# Patient Record
Sex: Female | Born: 1945 | Hispanic: No | Marital: Married | Smoking: Never smoker
Health system: Southern US, Community
[De-identification: ages and names within clinical notes are randomized; demographics above are authoritative.]

## PROBLEM LIST (undated history)

## (undated) DIAGNOSIS — J189 Pneumonia, unspecified organism: Secondary | ICD-10-CM

## (undated) DIAGNOSIS — C801 Malignant (primary) neoplasm, unspecified: Secondary | ICD-10-CM

## (undated) DIAGNOSIS — D649 Anemia, unspecified: Secondary | ICD-10-CM

## (undated) DIAGNOSIS — Z803 Family history of malignant neoplasm of breast: Secondary | ICD-10-CM

## (undated) DIAGNOSIS — Z8049 Family history of malignant neoplasm of other genital organs: Secondary | ICD-10-CM

## (undated) DIAGNOSIS — I10 Essential (primary) hypertension: Secondary | ICD-10-CM

## (undated) HISTORY — PX: COLONOSCOPY: SHX174

## (undated) HISTORY — DX: Family history of malignant neoplasm of breast: Z80.3

## (undated) HISTORY — DX: Essential (primary) hypertension: I10

## (undated) HISTORY — PX: CYST REMOVAL HAND: SHX6279

## (undated) HISTORY — DX: Family history of malignant neoplasm of other genital organs: Z80.49

## (undated) HISTORY — PX: BREAST SURGERY: SHX581

## (undated) HISTORY — PX: WISDOM TOOTH EXTRACTION: SHX21

## (undated) HISTORY — PX: OTHER SURGICAL HISTORY: SHX169

---

## 1995-09-30 HISTORY — PX: ABDOMINAL HYSTERECTOMY: SHX81

## 2012-06-25 DIAGNOSIS — Z23 Encounter for immunization: Secondary | ICD-10-CM | POA: Diagnosis not present

## 2012-06-25 DIAGNOSIS — Z7189 Other specified counseling: Secondary | ICD-10-CM | POA: Diagnosis not present

## 2012-07-09 ENCOUNTER — Other Ambulatory Visit: Payer: Self-pay | Admitting: Family Medicine

## 2012-07-09 DIAGNOSIS — Z1231 Encounter for screening mammogram for malignant neoplasm of breast: Secondary | ICD-10-CM

## 2012-07-09 DIAGNOSIS — Z9889 Other specified postprocedural states: Secondary | ICD-10-CM

## 2012-07-09 DIAGNOSIS — Z803 Family history of malignant neoplasm of breast: Secondary | ICD-10-CM

## 2012-07-30 ENCOUNTER — Ambulatory Visit
Admission: RE | Admit: 2012-07-30 | Discharge: 2012-07-30 | Disposition: A | Discharge status: Home / Self Care | Payer: Medicare Other | Source: Ambulatory Visit | Attending: Family Medicine | Admitting: Family Medicine

## 2012-07-30 DIAGNOSIS — Z0000 Encounter for general adult medical examination without abnormal findings: Secondary | ICD-10-CM | POA: Diagnosis not present

## 2012-07-30 DIAGNOSIS — Z1211 Encounter for screening for malignant neoplasm of colon: Secondary | ICD-10-CM | POA: Diagnosis not present

## 2012-07-30 DIAGNOSIS — Z9889 Other specified postprocedural states: Secondary | ICD-10-CM

## 2012-07-30 DIAGNOSIS — Z1231 Encounter for screening mammogram for malignant neoplasm of breast: Secondary | ICD-10-CM

## 2012-07-30 DIAGNOSIS — Z803 Family history of malignant neoplasm of breast: Secondary | ICD-10-CM

## 2012-07-30 DIAGNOSIS — Z01419 Encounter for gynecological examination (general) (routine) without abnormal findings: Secondary | ICD-10-CM | POA: Diagnosis not present

## 2012-07-30 DIAGNOSIS — Z8049 Family history of malignant neoplasm of other genital organs: Secondary | ICD-10-CM | POA: Diagnosis not present

## 2012-07-30 DIAGNOSIS — E785 Hyperlipidemia, unspecified: Secondary | ICD-10-CM | POA: Diagnosis not present

## 2013-01-25 DIAGNOSIS — R05 Cough: Secondary | ICD-10-CM | POA: Diagnosis not present

## 2013-01-25 DIAGNOSIS — I10 Essential (primary) hypertension: Secondary | ICD-10-CM | POA: Diagnosis not present

## 2013-01-25 DIAGNOSIS — J309 Allergic rhinitis, unspecified: Secondary | ICD-10-CM | POA: Diagnosis not present

## 2013-02-08 DIAGNOSIS — Z6830 Body mass index (BMI) 30.0-30.9, adult: Secondary | ICD-10-CM | POA: Diagnosis not present

## 2013-02-08 DIAGNOSIS — I10 Essential (primary) hypertension: Secondary | ICD-10-CM | POA: Diagnosis not present

## 2013-05-10 DIAGNOSIS — I10 Essential (primary) hypertension: Secondary | ICD-10-CM | POA: Diagnosis not present

## 2013-07-13 ENCOUNTER — Other Ambulatory Visit: Payer: Self-pay

## 2013-07-13 DIAGNOSIS — Z1231 Encounter for screening mammogram for malignant neoplasm of breast: Secondary | ICD-10-CM

## 2013-08-09 ENCOUNTER — Ambulatory Visit (INDEPENDENT_AMBULATORY_CARE_PROVIDER_SITE_OTHER): Payer: Medicare Other | Admitting: Gynecology

## 2013-08-09 ENCOUNTER — Encounter: Payer: Self-pay | Admitting: Gynecology

## 2013-08-09 ENCOUNTER — Other Ambulatory Visit (HOSPITAL_COMMUNITY)
Admission: RE | Admit: 2013-08-09 | Discharge: 2013-08-09 | Disposition: A | Discharge status: Home / Self Care | Payer: Medicare Other | Source: Ambulatory Visit | Attending: Gynecology | Admitting: Gynecology

## 2013-08-09 VITALS — BP 138/70 | Ht <= 58 in | Wt 143.0 lb

## 2013-08-09 DIAGNOSIS — Z1151 Encounter for screening for human papillomavirus (HPV): Secondary | ICD-10-CM | POA: Diagnosis not present

## 2013-08-09 DIAGNOSIS — N952 Postmenopausal atrophic vaginitis: Secondary | ICD-10-CM | POA: Diagnosis not present

## 2013-08-09 DIAGNOSIS — Z1272 Encounter for screening for malignant neoplasm of vagina: Secondary | ICD-10-CM

## 2013-08-09 DIAGNOSIS — N951 Menopausal and female climacteric states: Secondary | ICD-10-CM | POA: Diagnosis not present

## 2013-08-09 DIAGNOSIS — R635 Abnormal weight gain: Secondary | ICD-10-CM

## 2013-08-09 DIAGNOSIS — Z23 Encounter for immunization: Secondary | ICD-10-CM | POA: Diagnosis not present

## 2013-08-09 DIAGNOSIS — Z124 Encounter for screening for malignant neoplasm of cervix: Secondary | ICD-10-CM | POA: Diagnosis not present

## 2013-08-09 DIAGNOSIS — Z780 Asymptomatic menopausal state: Secondary | ICD-10-CM

## 2013-08-09 DIAGNOSIS — I10 Essential (primary) hypertension: Secondary | ICD-10-CM | POA: Insufficient documentation

## 2013-08-09 NOTE — Progress Notes (Signed)
Erica Greene Dec 15, 1945 [id number]   History:    67 y.o. Is a new patient to the practice who has been here for 1 year. She moved here from [place]. She saw an internist last year who put her on HCTZ secondary hypertension but has not followed up since then. Patient stated that she was 67 years of age she had a total abdominal hysterectomy with bilateral salpingo-oophorectomy as a result of ovarian cysts and menorrhagia and fibroid uterus. Patient states that she has never been on hormone replacement therapy in the past. She states that many years ago she had bone density study. Her last mammogram was in November of 2013 which was normal. She had a normal colonoscopy over 7 years ago. She states her shingles vaccines up-to-date would like to received the flu vaccine today.  Past medical history,surgical history, family history and social history were all reviewed and documented in the EPIC chart.  Gynecologic History No LMP recorded. Patient has had a hysterectomy. Contraception: status post hysterectomy Last Pap: over 3 years ago. Results were: normal Last mammogram: 2013. Results were: normal  Obstetric History OB History  Gravida Para Term Preterm AB SAB TAB Ectopic Multiple Living  2 2        2     # Outcome Date GA Lbr Len/2nd Weight Sex Delivery Anes PTL Lv  2 PAR           1 PAR                ROS: A ROS was performed and pertinent positives and negatives are included in the history.  GENERAL: No fevers or chills. HEENT: No change in vision, no earache, sore throat or sinus congestion. NECK: No pain or stiffness. CARDIOVASCULAR: No chest pain or pressure. No palpitations. PULMONARY: No shortness of breath, cough or wheeze. GASTROINTESTINAL: No abdominal pain, nausea, vomiting or diarrhea, melena or bright red blood per rectum. GENITOURINARY: No urinary frequency, urgency, hesitancy or dysuria. MUSCULOSKELETAL: No joint or muscle pain, no back pain, no recent trauma. DERMATOLOGIC: No  rash, no itching, no lesions. ENDOCRINE: No polyuria, polydipsia, no heat or cold intolerance. No recent change in weight. HEMATOLOGICAL: No anemia or easy bruising or bleeding. NEUROLOGIC: No headache, seizures, numbness, tingling or weakness. PSYCHIATRIC: No depression, no loss of interest in normal activity or change in sleep pattern.     Exam: chaperone present  BP 138/70  Ht 4\' 10"  (1.473 m)  Wt 143 lb (64.864 kg)  BMI 29.89 kg/m2  Body mass index is 29.89 kg/(m^2).  General appearance : Well developed well nourished female. No acute distress HEENT: Neck supple, trachea midline, no carotid bruits, no thyroidmegaly Lungs: Clear to auscultation, no rhonchi or wheezes, or rib retractions  Heart: Regular rate and rhythm, no murmurs or gallops Breast:Examined in sitting and supine position were symmetrical in appearance, no palpable masses or tenderness,  no skin retraction, no nipple inversion, no nipple discharge, no skin discoloration, no axillary or supraclavicular lymphadenopathy Abdomen: no palpable masses or tenderness, no rebound or guarding Extremities: no edema or skin discoloration or tenderness  Pelvic:  Bartholin, Urethra, Skene Glands: Within normal limits             Vagina: No gross lesions or discharge  Cervix: absent  Uterus Absent  Adnexa  Without masses or tenderness  Anus and perineum  normal   Rectovaginal  normal sphincter tone without palpated masses or tenderness  Hemoccult PCP we'll provide     Assessment/Plan:  67 y.o. female for annual exam who would like to be established with another provider here in her community. I've given her the name of one of my colleagues. She does not need a colonoscopy for 3 more years. She will schedule a bone density study here in the office the next few days. She does have a scheduled mammogram later this week. We discussed importance of calcium and vitamin D and regular exercise for osteoporosis prevention.  Patient did received the flu vaccine today. Pap smear was done today and the new guidelines were discussed as well.  Note: This dictation was prepared with  Dragon/digital dictation along withSmart phrase technology. Any transcriptional errors that result from this process are unintentional.   Ok Edwards MD, 4:08 PM 08/09/2013

## 2013-08-09 NOTE — Patient Instructions (Addendum)
H1N1 Influenza (swine flu) Vaccine injection What is this medicine? H1N1 INFLUENZA (SWINE FLU) VACCINE (H1N1 in floo EN zuh (swahyn floo) vak SEEN) is a vaccine to protect from an infection with the pandemic H1N1 flu, also known as the swine flu. The vaccine only helps protect you against this one strain of the flu. This vaccine does not help to the reduce the risk of getting other types of flu. You may also need to get the seasonal influenza virus vaccine. This medicine may be used for other purposes; ask your health care provider or pharmacist if you have questions. COMMON BRAND NAME(S): Influenza A (H1N1) 2009 Monovalent Vaccine What should I tell my health care provider before I take this medicine? They need to know if you have any of these conditions: -Guillain-Barre syndrome -immune system problems  Bone Densitometry Bone densitometry is a special X-ray that measures your bone density and can be used to help predict your risk of bone fractures. This test is used to determine bone mineral content and density to diagnose osteoporosis. Osteoporosis is the loss of bone that may cause the bone to become weak. Osteoporosis commonly occurs in women entering menopause. However, it may be found in men and in people with other diseases. PREPARATION FOR TEST No preparation necessary. WHO SHOULD BE TESTED?  All women older than 32.  Postmenopausal women (50 to 92) with risk factors for osteoporosis.  People with a previous fracture caused by normal activities.  People with a small body frame (less than 127 poundsor a body mass index [BMI] of less than 21).  People who have a parent with a hip fracture or history of osteoporosis.  People who smoke.  People who have rheumatoid arthritis.  Anyone who engages in excessive alcohol use (more than 3 drinks most days).  Women who experience early menopause. WHEN SHOULD YOU BE RETESTED? Current guidelines suggest that you should wait at least 2  years before doing a bone density test again if your first test was normal.Recent studies indicated that women with normal bone density may be able to wait a few years before needing to repeat a bone density test. You should discuss this with your caregiver.  NORMAL FINDINGS   Normal: less than standard deviation below normal (greater than -1).  Osteopenia: 1 to 2.5 standard deviations below normal (-1 to -2.5).  Osteoporosis: greater than 2.5 standard deviations below normal (less than -2.5). Test results are reported as a "T score" and a "Z score."The T score is a number that compares your bone density with the bone density of healthy, young women.The Z score is a number that compares your bone density with the scores of women who are the same age, gender, and race.  Ranges for normal findings may vary among different laboratories and hospitals. You should always check with your doctor after having lab work or other tests done to discuss the meaning of your test results and whether your values are considered within normal limits. MEANING OF TEST  Your caregiver will go over the test results with you and discuss the importance and meaning of your results, as well as treatment options and the need for additional tests if necessary. OBTAINING THE TEST RESULTS It is your responsibility to obtain your test results. Ask the lab or department performing the test when and how you will get your results. Document Released: 10/07/2004 Document Revised: 12/08/2011 Document Reviewed: 10/30/2010 [facility] Patient Information 2014 [place], [place].  -an unusual or allergic reaction to influenza  vaccine, eggs, neomycin, polymyxin, other medicines, foods, dyes or preservatives -pregnant or trying to get pregnant -breast-feeding How should I use this medicine? This vaccine is for injection into a muscle. It is given by a health care professional. A copy of Vaccine Information Statements will be given before  each vaccination. Read this sheet carefully each time. The sheet may change frequently. Talk to your pediatrician regarding the use of this medicine in children. Special care may be needed. While this drug may be prescribed for children as young as 6 months for selected conditions, precautions do apply. Overdosage: If you think you've taken too much of this medicine contact a poison control center or emergency room at once. Overdosage: If you think you have taken too much of this medicine contact a poison control center or emergency room at once. NOTE: This medicine is only for you. Do not share this medicine with others. What if I miss a dose? If needed, keep appointments for follow-up (booster) doses as directed. It is important not to miss your dose. Call your doctor or health care professional if you are unable to keep an appointment. What may interact with this medicine? -anakinra -medicines for organ transplant -medicines to treat cancer -other vaccines -rilonacept -steroid medicines like prednisone or cortisone -tumor necrosis factor (TNF) modifiers like adalimumab, etanercept, infliximab, golimumab, or certolizumab This list may not describe all possible interactions. Give your health care provider a list of all the medicines, herbs, non-prescription drugs, or dietary supplements you use. Also tell them if you smoke, drink alcohol, or use illegal drugs. Some items may interact with your medicine. What should I watch for while using this medicine? Report any side effects to your doctor right away. This vaccine lowers your risk of getting the pandemic H1N1 flu. You can get a milder H1N1 flu infection if you are around others with this flu. This flu vaccine will not protect against colds or other illnesses including other flu viruses. You may also need the seasonal influenza vaccine. What side effects may I notice from receiving this medicine? Side effects that you should report to your  doctor or health care professional as soon as possible: -allergic reactions like skin rash, itching or hives, swelling of the face, lips, or tongue -breathing problems -muscle weakness -unusual drooping or paralysis of face Side effects that usually do not require medical attention (Report these to your doctor or health care professional if they continue or are bothersome.): -chills -cough -headache -muscle aches and pains -runny or stuffy nose -sore throat -stomach upset -tiredness This list may not describe all possible side effects. Call your doctor for medical advice about side effects. You may report side effects to FDA at 1-800-FDA-1088. Where should I keep my medicine? This vaccine is only given in a clinic, pharmacy, doctor's office, or other health care setting and will not be stored at home. NOTE: This sheet is a summary. It may not cover all possible information. If you have questions about this medicine, talk to your doctor, pharmacist, or health care provider.  2014, Elsevier/Gold Standard. (2008-08-15 16:49:51)

## 2013-08-10 ENCOUNTER — Telehealth: Payer: Self-pay | Admitting: Internal Medicine

## 2013-08-10 NOTE — Telephone Encounter (Signed)
Pt had new pt appt w/ dr Lily Peer yesterday who advised pt to call you for a pcp. Pt has medicare and would like to know if you will accept her as a pt?

## 2013-08-12 ENCOUNTER — Ambulatory Visit
Admission: RE | Admit: 2013-08-12 | Discharge: 2013-08-12 | Disposition: A | Discharge status: Home / Self Care | Payer: Medicare Other | Source: Ambulatory Visit

## 2013-08-12 DIAGNOSIS — Z1231 Encounter for screening mammogram for malignant neoplasm of breast: Secondary | ICD-10-CM

## 2013-08-15 ENCOUNTER — Ambulatory Visit (INDEPENDENT_AMBULATORY_CARE_PROVIDER_SITE_OTHER): Payer: Medicare Other

## 2013-08-15 ENCOUNTER — Other Ambulatory Visit: Payer: Self-pay | Admitting: Gynecology

## 2013-08-15 DIAGNOSIS — Z780 Asymptomatic menopausal state: Secondary | ICD-10-CM

## 2013-08-15 DIAGNOSIS — M899 Disorder of bone, unspecified: Secondary | ICD-10-CM

## 2013-08-15 DIAGNOSIS — M8580 Other specified disorders of bone density and structure, unspecified site: Secondary | ICD-10-CM

## 2013-08-31 ENCOUNTER — Telehealth: Payer: Self-pay | Admitting: *Deleted

## 2013-08-31 NOTE — Telephone Encounter (Signed)
Pt scheduled w/ padonda

## 2013-08-31 NOTE — Telephone Encounter (Signed)
Pt called stating Dr.Panosh is not accepting new patient until 1st of Jan. Pt is need of PCP,I explained to pt that any physician at [place] will be fine. Pt will call [facility] to get in with another provider.

## 2013-09-19 ENCOUNTER — Ambulatory Visit (INDEPENDENT_AMBULATORY_CARE_PROVIDER_SITE_OTHER): Payer: Medicare Other | Admitting: Family

## 2013-09-19 ENCOUNTER — Encounter: Payer: Self-pay | Admitting: Family

## 2013-09-19 VITALS — BP 152/80 | HR 76 | Ht <= 58 in | Wt 143.0 lb

## 2013-09-19 DIAGNOSIS — M542 Cervicalgia: Secondary | ICD-10-CM | POA: Diagnosis not present

## 2013-09-19 DIAGNOSIS — I10 Essential (primary) hypertension: Secondary | ICD-10-CM

## 2013-09-19 LAB — HEPATIC FUNCTION PANEL
ALT: 12 U/L (ref 0–35)
AST: 15 U/L (ref 0–37)
Bilirubin, Direct: 0 mg/dL (ref 0.0–0.3)
Total Bilirubin: 0.4 mg/dL (ref 0.3–1.2)
Total Protein: 7.4 g/dL (ref 6.0–8.3)

## 2013-09-19 LAB — BASIC METABOLIC PANEL
BUN: 17 mg/dL (ref 6–23)
CO2: 29 mEq/L (ref 19–32)
Calcium: 9.1 mg/dL (ref 8.4–10.5)
Chloride: 105 mEq/L (ref 96–112)
Creatinine, Ser: 0.7 mg/dL (ref 0.4–1.2)
Glucose, Bld: 86 mg/dL (ref 70–99)
Potassium: 4.3 mEq/L (ref 3.5–5.1)

## 2013-09-19 MED ORDER — HYDROCHLOROTHIAZIDE 25 MG PO TABS: 25.0000 mg | ORAL_TABLET | Freq: Every day | ORAL | Status: DC

## 2013-09-19 MED ORDER — CYCLOBENZAPRINE HCL 5 MG PO TABS: 5.0000 mg | ORAL_TABLET | Freq: Three times a day (TID) | ORAL | Status: DC | PRN

## 2013-09-19 NOTE — Progress Notes (Signed)
Subjective:    Patient ID: Erica Greene, female    DOB: [birth year], 67 y.o.   MRN: [id number]  HPI  67 year old [place] American female, new patient to the practice and to be established. She has a history of hypertension and is currently taking Dyazide 25 mg once a day. She has been out of the medication x1 week. She has been in [place] 2 years but had been living in [place]. Her family is here.   She also has concerns of right neck pain after waking up this morning, she had a crook in her neck. Rates the pain 4/10, worse with movement. Denies any injury. Has not taken any medication for relief.   Review of Systems  Constitutional: Negative.   HENT: Negative.   Respiratory: Negative.   Cardiovascular: Negative.   Gastrointestinal: Negative.   Endocrine: Negative.   Genitourinary: Negative.   Musculoskeletal: Negative.   Skin: Negative.   Neurological: Negative.   Psychiatric/Behavioral: Negative.    Past Medical History  Diagnosis Date  . Hypertension     History   Social History  . Marital Status: Married    Spouse Name: N/A    Number of Children: N/A  . Years of Education: N/A   Occupational History  . Not on file.   Social History Main Topics  . Smoking status: Never Smoker   . Smokeless tobacco: Never Used  . Alcohol Use: Yes     Comment: OCC  . Drug Use: Not on file  . Sexual Activity: Yes   Other Topics Concern  . Not on file   Social History Narrative  . No narrative on file    Past Surgical History  Procedure Laterality Date  . Abdominal hysterectomy  1997    TOTAL ABDOMINAL HYSTERECTOMY  . Breast surgery      BREAST REDUCTION    Family History  Problem Relation Age of Onset  . Breast cancer Mother   . Cancer Mother     UTERINE  . Cancer Father     LUNG AND THROAT - SMOKER  . Cancer Paternal Uncle     LUNG   . Cancer Paternal Uncle     LUNG  . Cancer Paternal Uncle     LUNG  . Cancer Paternal Uncle     LUNG    Allergies    Allergen Reactions  . Latex   . Penicillins   . Sulfa Antibiotics     No current outpatient prescriptions on file prior to visit.   No current facility-administered medications on file prior to visit.    BP 152/80  Pulse 76  Ht 4\' 10"  (1.473 m)  Wt 143 lb (64.864 kg)  BMI 29.89 kg/m2chart    Objective:   Physical Exam  Constitutional: She is oriented to person, place, and time. She appears well-developed and well-nourished.  HENT:  Right Ear: External ear normal.  Left Ear: External ear normal.  Nose: Nose normal.  Mouth/Throat: Oropharynx is clear and moist.  Neck: Normal range of motion. Neck supple.  Cardiovascular: Normal rate, regular rhythm and normal heart sounds.   Pulmonary/Chest: Effort normal and breath sounds normal.  Abdominal: Soft. Bowel sounds are normal.  Musculoskeletal: Normal range of motion.  Neurological: She is alert and oriented to person, place, and time.  Skin: Skin is warm and dry.  Psychiatric: She has a normal mood and affect.          Assessment & Plan:  Assessment: 1. Hypertension 2. Left neck  pain 3. Obesity  Plan: BMP and LFT sent today will notify patient of the results. Renewed hydrochlorothiazide 25 mg one half to one tablet daily. Exercise to reduce weight. Low-sodium diet. Patient cut out any questions or concerns. Recheck in 6 months, pending labs, and sooner as needed.

## 2013-09-19 NOTE — Patient Instructions (Signed)
Sodium-Controlled Diet Sodium is a mineral. It is found in many foods. Sodium may be found naturally or added during the making of a food. The most common form of sodium is salt, which is made up of sodium and chloride. Reducing your sodium intake involves changing your eating habits. The following guidelines will help you reduce the sodium in your diet:  Stop using the salt shaker.  Use salt sparingly in cooking and baking.  Substitute with sodium-free seasonings and spices.  Do not use a salt substitute (potassium chloride) without your caregiver's permission.  Include a variety of fresh, unprocessed foods in your diet.  Limit the use of processed and convenience foods that are high in sodium. USE THE FOLLOWING FOODS SPARINGLY: Breads/Starches  Commercial bread stuffing, commercial pancake or waffle mixes, coating mixes. Waffles. Croutons. Prepared (boxed or frozen) potato, rice, or noodle mixes that contain salt or sodium. Salted French fries or hash browns. Salted popcorn, breads, crackers, chips, or snack foods. Vegetables  Vegetables canned with salt or prepared in cream, butter, or cheese sauces. Sauerkraut. Tomato or vegetable juices canned with salt.  Fresh vegetables are allowed if rinsed thoroughly. Fruit  Fruit is okay to eat. Meat and Meat Substitutes  Salted or smoked meats, such as bacon or Canadian bacon, chipped or corned beef, hot dogs, salt pork, luncheon meats, pastrami, ham, or sausage. Canned or smoked fish, poultry, or meat. Processed cheese or cheese spreads, blue or Roquefort cheese. Battered or frozen fish products. Prepared spaghetti sauce. Baked beans. Reuben sandwiches. Salted nuts. Caviar. Milk  Limit buttermilk to 1 cup per week. Soups and Combination Foods  Bouillon cubes, canned or dried soups, broth, consomm. Convenience (frozen or packaged) dinners with more than 600 mg sodium. Pot pies, pizza, Asian food, fast food cheeseburgers, and specialty  sandwiches. Desserts and Sweets  Regular (salted) desserts, pie, commercial fruit snack pies, commercial snack cakes, canned puddings.  Eat desserts and sweets in moderation. Fats and Oils  Gravy mixes or canned gravy. No more than 1 to 2 tbs of salad dressing. Chip dips.  Eat fats and oils in moderation. Beverages  See those listed under the vegetables and milk groups. Condiments  Ketchup, mustard, meat sauces, salsa, regular (salted) and lite soy sauce or mustard. Dill pickles, olives, meat tenderizer. Prepared horseradish or pickle relish. Dutch-processed cocoa. Baking powder or baking soda used medicinally. Worcestershire sauce. "Light" salt. Salt substitute, unless approved by your caregiver. Document Released: 03/07/2002 Document Revised: 12/08/2011 Document Reviewed: 10/08/2009 Exit[employer] Patient Information 2014 [employer].  

## 2014-01-25 DIAGNOSIS — J309 Allergic rhinitis, unspecified: Secondary | ICD-10-CM | POA: Diagnosis not present

## 2014-01-25 DIAGNOSIS — R05 Cough: Secondary | ICD-10-CM | POA: Diagnosis not present

## 2014-01-25 DIAGNOSIS — I10 Essential (primary) hypertension: Secondary | ICD-10-CM | POA: Diagnosis not present

## 2014-01-25 DIAGNOSIS — Z23 Encounter for immunization: Secondary | ICD-10-CM | POA: Diagnosis not present

## 2014-01-25 DIAGNOSIS — R059 Cough, unspecified: Secondary | ICD-10-CM | POA: Diagnosis not present

## 2014-03-20 ENCOUNTER — Ambulatory Visit: Payer: Medicare Other | Admitting: Family

## 2014-03-21 ENCOUNTER — Encounter: Payer: Self-pay | Admitting: Family

## 2014-03-21 ENCOUNTER — Ambulatory Visit (INDEPENDENT_AMBULATORY_CARE_PROVIDER_SITE_OTHER): Payer: Medicare Other | Admitting: Family

## 2014-03-21 VITALS — BP 128/74 | HR 80 | Temp 98.6°F | Wt 143.0 lb

## 2014-03-21 DIAGNOSIS — I10 Essential (primary) hypertension: Secondary | ICD-10-CM

## 2014-03-21 LAB — BASIC METABOLIC PANEL
BUN: 18 mg/dL (ref 6–23)
CHLORIDE: 104 meq/L (ref 96–112)
CO2: 30 mEq/L (ref 19–32)
Calcium: 9.3 mg/dL (ref 8.4–10.5)
Creatinine, Ser: 0.7 mg/dL (ref 0.4–1.2)
GFR: 114.48 mL/min (ref >60.00)
Glucose, Bld: 92 mg/dL (ref 70–99)
Potassium: 3.8 mEq/L (ref 3.5–5.1)
SODIUM: 142 meq/L (ref 135–145)

## 2014-03-21 NOTE — Progress Notes (Signed)
Subjective:    Patient ID: Erica Greene, female    DOB: Jan 19, 1946, 68 y.o.   MRN: [id number]  Hypertension   68 year old nonsmoking female presents for hypertension follow-up.  She currently takes HCTZ 25 mg daily. Her diet is modified heart healthy and she exercises daily. Her exercise includes walking one mile each day and 3 days/week of aerobic/yoga/strength training. She denies symptoms with exercise.  Specifically denied are chest pain, dyspnea, paroxsymal nocturnal dyspnea, claudication.  She denies adverse effects of medication. Her goal is to reduce weight through exercise and not have to take the BP medication.    Review of Systems  Constitutional: Negative.   HENT: Negative.   Eyes: Negative.   Respiratory: Negative.   Cardiovascular: Negative.   Gastrointestinal: Negative.   Endocrine: Negative.   Genitourinary: Negative.   Musculoskeletal: Negative.   Skin: Negative.   Allergic/Immunologic: Negative.   Neurological: Negative.   Hematological: Negative.   Psychiatric/Behavioral: Negative.    Past Medical History  Diagnosis Date  . Hypertension     History   Social History  . Marital Status: Married    Spouse Name: N/A    Number of Children: N/A  . Years of Education: N/A   Occupational History  . Not on file.   Social History Main Topics  . Smoking status: Never Smoker   . Smokeless tobacco: Never Used  . Alcohol Use: Yes     Comment: OCC  . Drug Use: Not on file  . Sexual Activity: Yes   Other Topics Concern  . Not on file   Social History Narrative  . No narrative on file    Past Surgical History  Procedure Laterality Date  . Abdominal hysterectomy  1997    TOTAL ABDOMINAL HYSTERECTOMY  . Breast surgery      BREAST REDUCTION    Family History  Problem Relation Age of Onset  . Breast cancer Mother   . Cancer Mother     UTERINE  . Cancer Father     LUNG AND THROAT - SMOKER  . Cancer Paternal Uncle     LUNG   . Cancer Paternal  Uncle     LUNG  . Cancer Paternal Uncle     LUNG  . Cancer Paternal Uncle     LUNG    Allergies  Allergen Reactions  . Latex   . Penicillins   . Sulfa Antibiotics     Current Outpatient Prescriptions on File Prior to Visit  Medication Sig Dispense Refill  . hydrochlorothiazide (HYDRODIURIL) 25 MG tablet Take 1 tablet (25 mg total) by mouth daily.  90 tablet  1   No current facility-administered medications on file prior to visit.    BP 128/74  Pulse 80  Temp(Src) 98.6 F (37 C) (Oral)  Wt 143 lb (64.864 kg)    Objective:   Physical Exam  Constitutional: She is oriented to person, place, and time. She appears well-developed and well-nourished. No distress.  HENT:  Head: Normocephalic and atraumatic.  Neck: Normal range of motion.  Cardiovascular: Normal rate, regular rhythm and intact distal pulses.  Exam reveals no gallop and no friction rub.   No murmur heard. Pulmonary/Chest: Effort normal and breath sounds normal. No respiratory distress.  Musculoskeletal: Normal range of motion. She exhibits no edema.  Neurological: She is alert and oriented to person, place, and time.  Skin: Skin is warm and dry. She is not diaphoretic.  Psychiatric: She has a normal mood and affect.  Her behavior is normal. Judgment and thought content normal.      Assessment & Plan:  Erica Greene was seen today for hypertension.  Diagnoses and associated orders for this visit:  Unspecified essential hypertension - Basic Metabolic Panel   Recheck in 4 months for CPX and sooner as needed.

## 2014-03-21 NOTE — Progress Notes (Signed)
Pre visit review using our clinic review tool, if applicable. No additional management support is needed unless otherwise documented below in the visit note. 

## 2014-03-21 NOTE — Patient Instructions (Signed)
Exercise to Lose Weight Exercise and a healthy diet may help you lose weight. Your doctor may suggest specific exercises. EXERCISE IDEAS AND TIPS  Choose low-cost things you enjoy doing, such as walking, bicycling, or exercising to workout videos.  Take stairs instead of the elevator.  Walk during your lunch break.  Park your car further away from work or school.  Go to a gym or an exercise class.  Start with 5 to 10 minutes of exercise each day. Build up to 30 minutes of exercise 4 to 6 days a week.  Wear shoes with good support and comfortable clothes.  Stretch before and after working out.  Work out until you breathe harder and your heart beats faster.  Drink extra water when you exercise.  Do not do so much that you hurt yourself, feel dizzy, or get very short of breath. Exercises that burn about 150 calories:  Running 1  miles in 15 minutes.  Playing volleyball for 45 to 60 minutes.  Washing and waxing a car for 45 to 60 minutes.  Playing touch football for 45 minutes.  Walking 1  miles in 35 minutes.  Pushing a stroller 1  miles in 30 minutes.  Playing basketball for 30 minutes.  Raking leaves for 30 minutes.  Bicycling 5 miles in 30 minutes.  Walking 2 miles in 30 minutes.  Dancing for 30 minutes.  Shoveling snow for 15 minutes.  Swimming laps for 20 minutes.  Walking up stairs for 15 minutes.  Bicycling 4 miles in 15 minutes.  Gardening for 30 to 45 minutes.  Jumping rope for 15 minutes.  Washing windows or floors for 45 to 60 minutes. Document Released: 10/18/2010 Document Revised: 12/08/2011 Document Reviewed: 10/18/2010 ExitCare Patient Information 2015 [employer]. This information is not intended to replace advice given to you by your health care provider. Make sure you discuss any questions you have with your health care provider.  

## 2014-06-06 ENCOUNTER — Encounter: Payer: Medicare Other | Admitting: Family

## 2014-06-08 DIAGNOSIS — H2510 Age-related nuclear cataract, unspecified eye: Secondary | ICD-10-CM | POA: Diagnosis not present

## 2014-06-12 ENCOUNTER — Ambulatory Visit (INDEPENDENT_AMBULATORY_CARE_PROVIDER_SITE_OTHER): Payer: Medicare Other | Admitting: Family

## 2014-06-12 ENCOUNTER — Encounter: Payer: Self-pay | Admitting: Family

## 2014-06-12 VITALS — BP 128/70 | HR 74 | Temp 98.0°F | Ht <= 58 in | Wt 142.0 lb

## 2014-06-12 DIAGNOSIS — Z23 Encounter for immunization: Secondary | ICD-10-CM

## 2014-06-12 DIAGNOSIS — Z0000 Encounter for general adult medical examination without abnormal findings: Secondary | ICD-10-CM

## 2014-06-12 DIAGNOSIS — I10 Essential (primary) hypertension: Secondary | ICD-10-CM | POA: Diagnosis not present

## 2014-06-12 LAB — CBC WITH DIFFERENTIAL/PLATELET
BASOS ABS: 0 10*3/uL (ref 0.0–0.1)
Basophils Relative: 0.4 % (ref 0.0–3.0)
EOS ABS: 0.1 10*3/uL (ref 0.0–0.7)
Eosinophils Relative: 1.3 % (ref 0.0–5.0)
HEMATOCRIT: 40.8 % (ref 36.0–46.0)
Hemoglobin: 13.5 g/dL (ref 12.0–15.0)
LYMPHS ABS: 1.8 10*3/uL (ref 0.7–4.0)
Lymphocytes Relative: 23.1 % (ref 12.0–46.0)
MCHC: 33 g/dL (ref 30.0–36.0)
MCV: 87.3 fl (ref 78.0–100.0)
MONO ABS: 0.6 10*3/uL (ref 0.1–1.0)
MONOS PCT: 7.1 % (ref 3.0–12.0)
NEUTROS ABS: 5.4 10*3/uL (ref 1.4–7.7)
Neutrophils Relative %: 68.1 % (ref 43.0–77.0)
Platelets: 274 10*3/uL (ref 150.0–400.0)
RBC: 4.68 Mil/uL (ref 3.87–5.11)
RDW: 14.3 % (ref 11.5–15.5)
WBC: 7.9 10*3/uL (ref 4.0–10.5)

## 2014-06-12 LAB — BASIC METABOLIC PANEL
BUN: 14 mg/dL (ref 6–23)
CHLORIDE: 103 meq/L (ref 96–112)
CO2: 28 mEq/L (ref 19–32)
Calcium: 9.4 mg/dL (ref 8.4–10.5)
Creatinine, Ser: 0.7 mg/dL (ref 0.4–1.2)
GFR: 105.15 mL/min (ref >60.00)
GLUCOSE: 82 mg/dL (ref 70–99)
POTASSIUM: 4.1 meq/L (ref 3.5–5.1)
SODIUM: 141 meq/L (ref 135–145)

## 2014-06-12 LAB — LIPID PANEL
Cholesterol: 199 mg/dL (ref 0–200)
HDL: 56.3 mg/dL (ref >39.00)
LDL Cholesterol: 133 mg/dL — ABNORMAL HIGH (ref 0–99)
NONHDL: 142.7
Total CHOL/HDL Ratio: 4
Triglycerides: 47 mg/dL (ref 0.0–149.0)
VLDL: 9.4 mg/dL (ref 0.0–40.0)

## 2014-06-12 LAB — HEPATIC FUNCTION PANEL
ALK PHOS: 51 U/L (ref 39–117)
ALT: 15 U/L (ref 0–35)
AST: 19 U/L (ref 0–37)
Albumin: 4 g/dL (ref 3.5–5.2)
Bilirubin, Direct: 0.1 mg/dL (ref 0.0–0.3)
Total Bilirubin: 0.6 mg/dL (ref 0.2–1.2)
Total Protein: 7.7 g/dL (ref 6.0–8.3)

## 2014-06-12 MED ORDER — HYDROCHLOROTHIAZIDE 25 MG PO TABS: 25.0000 mg | ORAL_TABLET | Freq: Every day | ORAL | Status: DC

## 2014-06-12 NOTE — Patient Instructions (Signed)

## 2014-06-12 NOTE — Progress Notes (Signed)
Subjective:    Patient ID: Erica Greene, female    DOB: 06-Jul-1946, 68 y.o.   MRN: [id number]  HPI  53 year AAF, nonsmoker, is in today for a CPX. Has a history of Hypertension.  Has a mammogram and GYN appt scheduled in November. Last colonoscopy was 3 years ago. Exercises 4 days a week doing cardio but walks a mile every day.    This is a routine wellness  examination for this patient . I reviewed all health maintenance protocols including mammography, colonoscopy, bone density Needed referrals were placed. Age and diagnosis  appropriate screening labs were ordered. Her immunization history was reviewed and appropriate vaccinations were ordered. Her current medications and allergies were reviewed and needed refills of her chronic medications were ordered. The plan for yearly health maintenance was discussed all orders and referrals were made as appropriate.   Review of Systems  Constitutional: Negative.   HENT: Negative.   Eyes: Negative.   Respiratory: Negative.   Cardiovascular: Negative.   Gastrointestinal: Negative.   Endocrine: Negative.   Genitourinary: Negative.   Musculoskeletal: Negative.   Skin: Negative.   Allergic/Immunologic: Negative.   Neurological: Negative.   Hematological: Negative.   Psychiatric/Behavioral: Negative.    Past Medical History  Diagnosis Date  . Hypertension     History   Social History  . Marital Status: Married    Spouse Name: N/A    Number of Children: N/A  . Years of Education: N/A   Occupational History  . Not on file.   Social History Main Topics  . Smoking status: Never Smoker   . Smokeless tobacco: Never Used  . Alcohol Use: Yes     Comment: OCC  . Drug Use: Not on file  . Sexual Activity: Yes   Other Topics Concern  . Not on file   Social History Narrative  . No narrative on file    Past Surgical History  Procedure Laterality Date  . Abdominal hysterectomy  1997    TOTAL ABDOMINAL HYSTERECTOMY  . Breast  surgery      BREAST REDUCTION    Family History  Problem Relation Age of Onset  . Breast cancer Mother   . Cancer Mother     UTERINE  . Cancer Father     LUNG AND THROAT - SMOKER  . Cancer Paternal Uncle     LUNG   . Cancer Paternal Uncle     LUNG  . Cancer Paternal Uncle     LUNG  . Cancer Paternal Uncle     LUNG    Allergies  Allergen Reactions  . Latex   . Penicillins   . Sulfa Antibiotics     No current outpatient prescriptions on file prior to visit.   No current facility-administered medications on file prior to visit.    BP 128/70  Pulse 74  Temp(Src) 98 F (36.7 C) (Oral)  Ht 4\' 10"  (1.473 m)  Wt 142 lb (64.411 kg)  BMI 29.69 kg/m2chart    Objective:   Physical Exam  Constitutional: She is oriented to person, place, and time. She appears well-developed and well-nourished.  HENT:  Head: Normocephalic and atraumatic.  Right Ear: External ear normal.  Left Ear: External ear normal.  Nose: Nose normal.  Mouth/Throat: Oropharynx is clear and moist.  Eyes: Conjunctivae and EOM are normal. Pupils are equal, round, and reactive to light.  Neck: Normal range of motion. Neck supple.  Cardiovascular: Normal rate, regular rhythm and normal heart sounds.  Pulmonary/Chest: Effort normal and breath sounds normal.  Abdominal: Soft. Bowel sounds are normal.  Genitourinary:  Deferred to GYN  Musculoskeletal: Normal range of motion.  Neurological: She is alert and oriented to person, place, and time. She has normal reflexes. She displays normal reflexes. No cranial nerve deficit. Coordination normal.  Skin: Skin is warm and dry.  Psychiatric: She has a normal mood and affect.          Assessment & Plan:  Erica Greene was seen today for annual exam.  Diagnoses and associated orders for this visit:  Preventative health care - Basic Metabolic Panel - POC Urinalysis Dipstick - CBC with Differential - Lipid Panel - EKG 12-Lead  Unspecified essential  hypertension - Basic Metabolic Panel - Hepatic Function Panel - POC Urinalysis Dipstick - CBC with Differential - Lipid Panel - EKG 12-Lead  Other Orders - hydrochlorothiazide (HYDRODIURIL) 25 MG tablet; Take 1 tablet (25 mg total) by mouth daily.   Recheck in 6 months. Encouraged a healthy diet, exercise, self breast exams.

## 2014-06-12 NOTE — Progress Notes (Signed)
Pre visit review using our clinic review tool, if applicable. No additional management support is needed unless otherwise documented below in the visit note. 

## 2014-07-03 ENCOUNTER — Telehealth: Payer: Self-pay | Admitting: Family

## 2014-07-03 MED ORDER — HYDROCHLOROTHIAZIDE 25 MG PO TABS: 25.0000 mg | ORAL_TABLET | Freq: Every day | ORAL | Status: DC

## 2014-07-03 NOTE — Telephone Encounter (Signed)
Rx Resent

## 2014-07-03 NOTE — Telephone Encounter (Signed)
Pt states [employer] states they did not received rx refill for hydrochlorothiazide (HYDRODIURIL) 25 MG tablet.  Please send rx refill approval back to pharmacy.

## 2014-07-04 ENCOUNTER — Encounter: Payer: Self-pay | Admitting: Family

## 2014-07-04 ENCOUNTER — Ambulatory Visit (INDEPENDENT_AMBULATORY_CARE_PROVIDER_SITE_OTHER): Payer: Medicare Other | Admitting: Family

## 2014-07-04 VITALS — BP 140/78 | HR 76 | Temp 98.2°F | Wt 141.0 lb

## 2014-07-04 DIAGNOSIS — I10 Essential (primary) hypertension: Secondary | ICD-10-CM

## 2014-07-04 DIAGNOSIS — J301 Allergic rhinitis due to pollen: Secondary | ICD-10-CM | POA: Diagnosis not present

## 2014-07-04 DIAGNOSIS — R059 Cough, unspecified: Secondary | ICD-10-CM

## 2014-07-04 DIAGNOSIS — R05 Cough: Secondary | ICD-10-CM | POA: Diagnosis not present

## 2014-07-04 NOTE — Progress Notes (Signed)
Pre visit review using our clinic review tool, if applicable. No additional management support is needed unless otherwise documented below in the visit note. 

## 2014-07-04 NOTE — Progress Notes (Signed)
Subjective:    Patient ID: Erica Greene, female    DOB: Oct 16, 1945, 68 y.o.   MRN: [id number]  HPI Comments: 68 year old African American female, nonsmoker is in today with complaints of a cough x2 weeks. Describes it as a tickle in her throat. It is not worse at any particular time of the day. Has been taking over-the-counter Robitussin without much relief. Denies any fever, chills, heartburn or indigestion.   Cough Pertinent negatives include no chills, fever, shortness of breath or wheezing.      Review of Systems  Constitutional: Negative.  Negative for fever and chills.  HENT: Negative.   Respiratory: Positive for cough. Negative for shortness of breath and wheezing.   Cardiovascular: Negative.   Gastrointestinal: Negative.   Endocrine: Negative.   Genitourinary: Negative.   Musculoskeletal: Negative.   Skin: Negative.   Hematological: Negative.   Psychiatric/Behavioral: Negative.    Past Medical History  Diagnosis Date  . Hypertension     History   Social History  . Marital Status: Married    Spouse Name: N/A    Number of Children: N/A  . Years of Education: N/A   Occupational History  . Not on file.   Social History Main Topics  . Smoking status: Never Smoker   . Smokeless tobacco: Never Used  . Alcohol Use: Yes     Comment: OCC  . Drug Use: Not on file  . Sexual Activity: Yes   Other Topics Concern  . Not on file   Social History Narrative  . No narrative on file    Past Surgical History  Procedure Laterality Date  . Abdominal hysterectomy  1997    TOTAL ABDOMINAL HYSTERECTOMY  . Breast surgery      BREAST REDUCTION    Family History  Problem Relation Age of Onset  . Breast cancer Mother   . Cancer Mother     UTERINE  . Cancer Father     LUNG AND THROAT - SMOKER  . Cancer Paternal Uncle     LUNG   . Cancer Paternal Uncle     LUNG  . Cancer Paternal Uncle     LUNG  . Cancer Paternal Uncle     LUNG    Allergies  Allergen  Reactions  . Latex   . Penicillins   . Sulfa Antibiotics     Current Outpatient Prescriptions on File Prior to Visit  Medication Sig Dispense Refill  . hydrochlorothiazide (HYDRODIURIL) 25 MG tablet Take 1 tablet (25 mg total) by mouth daily.  90 tablet  1   No current facility-administered medications on file prior to visit.    BP 140/78  Pulse 76  Temp(Src) 98.2 F (36.8 C) (Oral)  Wt 141 lb (63.957 kg)chart    Objective:   Physical Exam  Constitutional: She is oriented to person, place, and time. She appears well-developed and well-nourished.  HENT:  Right Ear: External ear normal.  Left Ear: External ear normal.  Nose: Nose normal.  Mouth/Throat: Oropharynx is clear and moist.  Neck: Normal range of motion. Neck supple.  Cardiovascular: Normal rate, regular rhythm and normal heart sounds.   Pulmonary/Chest: Effort normal and breath sounds normal.  Musculoskeletal: Normal range of motion.  Neurological: She is alert and oriented to person, place, and time.  Skin: Skin is warm and dry.  Psychiatric: She has a normal mood and affect.          Assessment & Plan:  Erica Greene was seen today for cough.  Diagnoses and associated orders for this visit:  Hay fever  Essential hypertension, benign  Cough   Call at any questions or concerns. Recheck as scheduled and as needed. Advised antihistamine like Zyrtec, Claritin or Allegra.

## 2014-07-04 NOTE — Patient Instructions (Signed)
1. OTC Zyrtec once daily.. Or Claritin, Allegra. The generic equivalent is ok.   Hay Fever Hay fever is an allergic reaction to particles in the air. It cannot be passed from person to person. It cannot be cured, but it can be controlled. CAUSES  Hay fever is caused by something that triggers an allergic reaction (allergens). The following are examples of allergens:  Ragweed.  Feathers.  Animal dander.  Grass and tree pollens.  Cigarette smoke.  House dust.  Pollution. SYMPTOMS   Sneezing.  Runny or stuffy nose.  Tearing eyes.  Itchy eyes, nose, mouth, throat, skin, or other area.  Sore throat.  Headache.  Decreased sense of smell or taste.  Cough DIAGNOSIS Your caregiver will perform a physical exam and ask questions about the symptoms you are having.Allergy testing may be done to determine exactly what triggers your hay fever.  TREATMENT   Over-the-counter medicines may help symptoms. These include:  Antihistamines.  Decongestants. These may help with nasal congestion.  Your caregiver may prescribe medicines if over-the-counter medicines do not work.  Some people benefit from allergy shots when other medicines are not helpful. HOME CARE INSTRUCTIONS   Avoid the allergen that is causing your symptoms, if possible.  Take all medicine as told by your caregiver. SEEK MEDICAL CARE IF:   You have severe allergy symptoms and your current medicines are not helping.  Your treatment was working at one time, but you are now experiencing symptoms.  You have sinus congestion and pressure.  You develop a fever or headache.  You have thick nasal discharge.  You have asthma and have a worsening cough and wheezing. SEEK IMMEDIATE MEDICAL CARE IF:   You have swelling of your tongue or lips.  You have trouble breathing.  You feel lightheaded or like you are going to faint.  You have cold sweats.  You have a fever. Document Released: 09/15/2005 Document  Revised: 12/08/2011 Document Reviewed: 12/11/2010 [facility] Patient Information 2015 [place], [place]. This information is not intended to replace advice given to you by your health care provider. Make sure you discuss any questions you have with your health care provider.

## 2014-07-05 ENCOUNTER — Telehealth: Payer: Self-pay | Admitting: Family

## 2014-07-05 NOTE — Telephone Encounter (Signed)
emmi emailed °

## 2014-07-20 ENCOUNTER — Other Ambulatory Visit: Payer: Self-pay

## 2014-07-20 DIAGNOSIS — Z9889 Other specified postprocedural states: Secondary | ICD-10-CM

## 2014-07-20 DIAGNOSIS — Z1231 Encounter for screening mammogram for malignant neoplasm of breast: Secondary | ICD-10-CM

## 2014-07-31 ENCOUNTER — Encounter: Payer: Self-pay | Admitting: Family

## 2014-08-11 ENCOUNTER — Ambulatory Visit (INDEPENDENT_AMBULATORY_CARE_PROVIDER_SITE_OTHER): Payer: Medicare Other | Admitting: Women's Health

## 2014-08-11 ENCOUNTER — Encounter: Payer: Self-pay | Admitting: Women's Health

## 2014-08-11 VITALS — BP 134/80 | Ht 59.0 in | Wt 142.0 lb

## 2014-08-11 DIAGNOSIS — M8580 Other specified disorders of bone density and structure, unspecified site: Secondary | ICD-10-CM | POA: Diagnosis not present

## 2014-08-11 NOTE — Patient Instructions (Signed)
Health Recommendations for Postmenopausal Women Respected and ongoing research has looked at the most common causes of death, disability, and poor quality of life in postmenopausal women. The causes include heart disease, diseases of blood vessels, diabetes, depression, cancer, and bone loss (osteoporosis). Many things can be done to help lower the chances of developing these and other common problems. CARDIOVASCULAR DISEASE Heart Disease: A heart attack is a medical emergency. Know the signs and symptoms of a heart attack. Below are things women can do to reduce their risk for heart disease.   Do not smoke. If you smoke, quit.  Aim for a healthy weight. Being overweight causes many preventable deaths. Eat a healthy and balanced diet and drink an adequate amount of liquids.  Get moving. Make a commitment to be more physically active. Aim for 30 minutes of activity on most, if not all days of the week.  Eat for heart health. Choose a diet that is low in saturated fat and cholesterol and eliminate trans fat. Include whole grains, vegetables, and fruits. Read and understand the labels on food containers before buying.  Know your numbers. Ask your caregiver to check your blood pressure, cholesterol (total, HDL, LDL, triglycerides) and blood glucose. Work with your caregiver on improving your entire clinical picture.  High blood pressure. Limit or stop your table salt intake (try salt substitute and food seasonings). Avoid salty foods and drinks. Read labels on food containers before buying. Eating well and exercising can help control high blood pressure. STROKE  Stroke is a medical emergency. Stroke may be the result of a blood clot in a blood vessel in the brain or by a brain hemorrhage (bleeding). Know the signs and symptoms of a stroke. To lower the risk of developing a stroke:  Avoid fatty foods.  Quit smoking.  Control your diabetes, blood pressure, and irregular heart rate. THROMBOPHLEBITIS  (BLOOD CLOT) OF THE LEG  Becoming overweight and leading a stationary lifestyle may also contribute to developing blood clots. Controlling your diet and exercising will help lower the risk of developing blood clots. CANCER SCREENING  Breast Cancer: Take steps to reduce your risk of breast cancer.  You should practice "breast self-awareness." This means understanding the normal appearance and feel of your breasts and should include breast self-examination. Any changes detected, no matter how small, should be reported to your caregiver.  After age 40, you should have a clinical breast exam (CBE) every year.  Starting at age 40, you should consider having a mammogram (breast X-ray) every year.  If you have a family history of breast cancer, talk to your caregiver about genetic screening.  If you are at high risk for breast cancer, talk to your caregiver about having an MRI and a mammogram every year.  Intestinal or Stomach Cancer: Tests to consider are a rectal exam, fecal occult blood, sigmoidoscopy, and colonoscopy. Women who are high risk may need to be screened at an earlier age and more often.  Cervical Cancer:  Beginning at age 30, you should have a Pap test every 3 years as long as the past 3 Pap tests have been normal.  If you have had past treatment for cervical cancer or a condition that could lead to cancer, you need Pap tests and screening for cancer for at least 20 years after your treatment.  If you had a hysterectomy for a problem that was not cancer or a condition that could lead to cancer, then you no longer need Pap tests.    If you are between ages 65 and 70, and you have had normal Pap tests going back 10 years, you no longer need Pap tests.  If Pap tests have been discontinued, risk factors (such as a new sexual partner) need to be reassessed to determine if screening should be resumed.  Some medical problems can increase the chance of getting cervical cancer. In these  cases, your caregiver may recommend more frequent screening and Pap tests.  Uterine Cancer: If you have vaginal bleeding after reaching menopause, you should notify your caregiver.  Ovarian Cancer: Other than yearly pelvic exams, there are no reliable tests available to screen for ovarian cancer at this time except for yearly pelvic exams.  Lung Cancer: Yearly chest X-rays can detect lung cancer and should be done on high risk women, such as cigarette smokers and women with chronic lung disease (emphysema).  Skin Cancer: A complete body skin exam should be done at your yearly examination. Avoid overexposure to the sun and ultraviolet light lamps. Use a strong sun block cream when in the sun. All of these things are important for lowering the risk of skin cancer. MENOPAUSE Menopause Symptoms: Hormone therapy products are effective for treating symptoms associated with menopause:  Moderate to severe hot flashes.  Night sweats.  Mood swings.  Headaches.  Tiredness.  Loss of sex drive.  Insomnia.  Other symptoms. Hormone replacement carries certain risks, especially in older women. Women who use or are thinking about using estrogen or estrogen with progestin treatments should discuss that with their caregiver. Your caregiver will help you understand the benefits and risks. The ideal dose of hormone replacement therapy is not known. The Food and Drug Administration (FDA) has concluded that hormone therapy should be used only at the lowest doses and for the shortest amount of time to reach treatment goals.  OSTEOPOROSIS Protecting Against Bone Loss and Preventing Fracture If you use hormone therapy for prevention of bone loss (osteoporosis), the risks for bone loss must outweigh the risk of the therapy. Ask your caregiver about other medications known to be safe and effective for preventing bone loss and fractures. To guard against bone loss or fractures, the following is recommended:  If  you are younger than age 50, take 1000 mg of calcium and at least 600 mg of Vitamin D per day.  If you are older than age 50 but younger than age 70, take 1200 mg of calcium and at least 600 mg of Vitamin D per day.  If you are older than age 70, take 1200 mg of calcium and at least 800 mg of Vitamin D per day. Smoking and excessive alcohol intake increases the risk of osteoporosis. Eat foods rich in calcium and vitamin D and do weight bearing exercises several times a week as your caregiver suggests. DIABETES Diabetes Mellitus: If you have type I or type 2 diabetes, you should keep your blood sugar under control with diet, exercise, and recommended medication. Avoid starchy and fatty foods, and too many sweets. Being overweight can make diabetes control more difficult. COGNITION AND MEMORY Cognition and Memory: Menopausal hormone therapy is not recommended for the prevention of cognitive disorders such as Alzheimer's disease or memory loss.  DEPRESSION  Depression may occur at any age, but it is common in elderly women. This may be because of physical, medical, social (loneliness), or financial problems and needs. If you are experiencing depression because of medical problems and control of symptoms, talk to your caregiver about this. Physical   activity and exercise may help with mood and sleep. Community and volunteer involvement may improve your sense of value and worth. If you have depression and you feel that the problem is getting worse or becoming severe, talk to your caregiver about which treatment options are best for you. ACCIDENTS  Accidents are common and can be serious in elderly woman. Prepare your house to prevent accidents. Eliminate throw rugs, place hand bars in bath, shower, and toilet areas. Avoid wearing high heeled shoes or walking on wet, snowy, and icy areas. Limit or stop driving if you have vision or hearing problems, or if you feel you are unsteady with your movements and  reflexes. HEPATITIS C Hepatitis C is a type of viral infection affecting the liver. It is spread mainly through contact with blood from an infected person. It can be treated, but if left untreated, it can lead to severe liver damage over the years. Many people who are infected do not know that the virus is in their blood. If you are a "baby-boomer", it is recommended that you have one screening test for Hepatitis C. IMMUNIZATIONS  Several immunizations are important to consider having during your senior years, including:   Tetanus, diphtheria, and pertussis booster shot.  Influenza every year before the flu season begins.  Pneumonia vaccine.  Shingles vaccine.  Others, as indicated based on your specific needs. Talk to your caregiver about these. Document Released: 11/07/2005 Document Revised: 01/30/2014 Document Reviewed: 07/03/2008 [employer] Patient Information 2015 [employer]. This information is not intended to replace advice given to you by your health care provider. Make sure you discuss any questions you have with your health care provider.  

## 2014-08-11 NOTE — Progress Notes (Signed)
Erica Greene 03-Jan-1946 [id number]    History:    Presents for breast and pelvic exam.1997 TAH with BSO for fibroids and cysts. No HRT  Normal Pap and mammogram history. Mother breast cancer and uterine cancer. Has had Zostavax and Pneumovax. Negative colonoscopy about 8 years ago. Hypertensive primary care managing. 2014 DEXA T score -1.5 at spine, hip average -0.5 FRAX 4.2%/0.5%.  Past medical history, past surgical history, family history and social history were all reviewed and documented in the EPIC chart. Moved here last year from [place]. Husband prostate cancer.  ROS:  A  12 point ROS was performed and pertinent positives and negatives are included.  Exam:  Filed Vitals:   08/11/14 1444  BP: 134/80    General appearance:  Normal Thyroid:  Symmetrical, normal in size, without palpable masses or nodularity. Respiratory  Auscultation:  Clear without wheezing or rhonchi Cardiovascular  Auscultation:  Regular rate, without rubs, murmurs or gallops  Edema/varicosities:  Not grossly evident Abdominal  Soft,nontender, without masses, guarding or rebound.  Liver/spleen:  No organomegaly noted  Hernia:  None appreciated  Skin  Inspection:  Grossly normal   Breasts: Examined lying and sitting.     Right: Without masses, retractions, discharge or axillary adenopathy.     Left: Without masses, retractions, discharge or axillary adenopathy. Gentitourinary   Inguinal/mons:  Normal without inguinal adenopathy  External genitalia:  Normal  BUS/Urethra/Skene's glands:  Normal  Vagina:  Normal  Cervix:  absent  Uterus: absent  Adnexa/parametria:     Rt: Without masses or tenderness.   Lt: Without masses or tenderness.  Anus and perineum: Normal  Digital rectal exam: Normal sphincter tone without palpated masses or tenderness  Assessment/Plan:  68 y.o. MBF G3P2 for breast and pelvic exam.  97 TAH with BSO for fibroids and benign cysts on no HRT Hypertension-primary care managing  labs and meds Osteopenia without elevated FRAX  Plan: SBE's, continue annual screening mammogram, calcium rich diet, vitamin D 2000, will have primary care check vitamin D level. Home safety, fall prevention and importance of regular daily exercise reviewed.    Huel Cote [employer] [facility], 3:23 PM 08/11/2014

## 2014-08-14 ENCOUNTER — Ambulatory Visit
Admission: RE | Admit: 2014-08-14 | Discharge: 2014-08-14 | Disposition: A | Discharge status: Home / Self Care | Payer: Medicare Other | Source: Ambulatory Visit

## 2014-08-14 DIAGNOSIS — Z1231 Encounter for screening mammogram for malignant neoplasm of breast: Secondary | ICD-10-CM | POA: Diagnosis not present

## 2014-08-14 DIAGNOSIS — Z9889 Other specified postprocedural states: Secondary | ICD-10-CM

## 2014-12-05 ENCOUNTER — Ambulatory Visit: Payer: Medicare Other | Admitting: Family

## 2014-12-07 ENCOUNTER — Ambulatory Visit: Payer: Medicare Other | Admitting: Family

## 2014-12-07 ENCOUNTER — Encounter: Payer: Self-pay | Admitting: Family

## 2014-12-07 ENCOUNTER — Ambulatory Visit (INDEPENDENT_AMBULATORY_CARE_PROVIDER_SITE_OTHER): Payer: Medicare Other | Admitting: Family

## 2014-12-07 VITALS — BP 130/80 | HR 79 | Temp 98.4°F | Resp 17 | Ht 59.0 in | Wt 145.2 lb

## 2014-12-07 DIAGNOSIS — I10 Essential (primary) hypertension: Secondary | ICD-10-CM

## 2014-12-07 DIAGNOSIS — F430 Acute stress reaction: Secondary | ICD-10-CM | POA: Diagnosis not present

## 2014-12-07 DIAGNOSIS — R635 Abnormal weight gain: Secondary | ICD-10-CM | POA: Diagnosis not present

## 2014-12-07 LAB — BASIC METABOLIC PANEL
BUN: 21 mg/dL (ref 6–23)
CALCIUM: 10.6 mg/dL — AB (ref 8.4–10.5)
CO2: 34 meq/L — AB (ref 19–32)
Chloride: 101 mEq/L (ref 96–112)
Creatinine, Ser: 0.77 mg/dL (ref 0.40–1.20)
GFR: 95.62 mL/min (ref >60.00)
Glucose, Bld: 90 mg/dL (ref 70–99)
Potassium: 4 mEq/L (ref 3.5–5.1)
Sodium: 144 mEq/L (ref 135–145)

## 2014-12-07 LAB — TSH: TSH: 0.82 u[IU]/mL (ref 0.35–4.50)

## 2014-12-07 LAB — T4: T4 TOTAL: 7.8 ug/dL (ref 4.5–12.0)

## 2014-12-07 NOTE — Progress Notes (Signed)
Subjective:    Patient ID: Erica Greene, female    DOB: December 03, 1945, 69 y.o.   MRN: [id number]  HPI  69 year old African-American female, nonsmoker with a history of hypertension is in today for recheck. She's currently taking hydrochlorthiazide 25 mg once daily and tolerating it well. Has concerns of weight gain. She exercises daily and tries to eat a healthy, balanced diet. Continues to gain weight. Does admit to being more stressed recently with her husband battling prostate cancer. He successfully had a new procedure to read the cancer. Stress levels are improving.  Review of Systems  Constitutional: Negative.   HENT: Negative.   Respiratory: Negative.   Cardiovascular: Negative.   Gastrointestinal: Negative.   Endocrine: Negative.   Genitourinary: Negative.   Musculoskeletal: Negative.   Skin: Negative.   Allergic/Immunologic: Negative.   Neurological: Negative.   Hematological: Negative.   Psychiatric/Behavioral: Negative.    Past Medical History  Diagnosis Date  . Hypertension     History   Social History  . Marital Status: Married    Spouse Name: N/A  . Number of Children: N/A  . Years of Education: N/A   Occupational History  . Not on file.   Social History Main Topics  . Smoking status: Never Smoker   . Smokeless tobacco: Never Used  . Alcohol Use: Yes     Comment: OCC  . Drug Use: Not on file  . Sexual Activity: Yes   Other Topics Concern  . Not on file   Social History Narrative    Past Surgical History  Procedure Laterality Date  . Abdominal hysterectomy  1997    TOTAL ABDOMINAL HYSTERECTOMY  . Breast surgery      BREAST REDUCTION    Family History  Problem Relation Age of Onset  . Breast cancer Mother   . Cancer Mother     UTERINE  . Cancer Father     LUNG AND THROAT - SMOKER  . Cancer Paternal Uncle     LUNG   . Cancer Paternal Uncle     LUNG  . Cancer Paternal Uncle     LUNG  . Cancer Paternal Uncle     LUNG    Allergies    Allergen Reactions  . Latex   . Penicillins   . Sulfa Antibiotics     Current Outpatient Prescriptions on File Prior to Visit  Medication Sig Dispense Refill  . Bioflavonoid Products (VITAMIN C/BIOFLAVONOIDS) 1000-25 MG TABS Take 2 tablets by mouth daily.    Marland Kitchen CALCIUM PO Take 1 tablet by mouth daily.    . hydrochlorothiazide (HYDRODIURIL) 25 MG tablet Take 1 tablet (25 mg total) by mouth daily. 90 tablet 1   No current facility-administered medications on file prior to visit.    BP 130/80 mmHg  Pulse 79  Temp(Src) 98.4 F (36.9 C) (Oral)  Resp 17  Ht 4\' 11"  (1.499 m)  Wt 145 lb 3.2 oz (65.862 kg)  BMI 29.31 kg/m2  SpO2 98%chart     Objective:   Physical Exam  Constitutional: She is oriented to person, place, and time. She appears well-developed and well-nourished.  HENT:  Right Ear: External ear normal.  Left Ear: External ear normal.  Nose: Nose normal.  Mouth/Throat: Oropharynx is clear and moist.  Neck: Normal range of motion. Neck supple. No thyromegaly present.  Cardiovascular: Normal rate, regular rhythm and normal heart sounds.   Pulmonary/Chest: Effort normal and breath sounds normal.  Abdominal: Soft. Bowel sounds are normal.  Musculoskeletal:  Normal range of motion.  Neurological: She is alert and oriented to person, place, and time.  Skin: Skin is warm and dry.  Psychiatric: She has a normal mood and affect.          Assessment & Plan:  Erica Greene was seen today for follow-up.  Diagnoses and all orders for this visit:  Essential hypertension Orders: -     Basic Metabolic Panel  Weight gain Orders: -     TSH -     T3 -     T4  Acute stress reaction   Increasing weight may likely be due to increase in stress levels. However, due to the history of hypothyroidism and her family will obtain a thyroid panel to be sure it's fine. Continue exercising and a balanced diet. Recheck in 6 months for complete physical exam and sooner as needed.

## 2014-12-07 NOTE — Progress Notes (Signed)
Pre visit review using our clinic review tool, if applicable. No additional management support is needed unless otherwise documented below in the visit note. 

## 2014-12-07 NOTE — Patient Instructions (Signed)

## 2014-12-08 LAB — T3: T3, Total: 105.2 ng/dL (ref 80.0–204.0)

## 2014-12-13 ENCOUNTER — Ambulatory Visit: Payer: Medicare Other | Admitting: Family Medicine

## 2015-07-13 ENCOUNTER — Other Ambulatory Visit: Payer: Self-pay

## 2015-07-13 DIAGNOSIS — Z1231 Encounter for screening mammogram for malignant neoplasm of breast: Secondary | ICD-10-CM

## 2015-07-16 DIAGNOSIS — Z23 Encounter for immunization: Secondary | ICD-10-CM | POA: Diagnosis not present

## 2015-07-24 DIAGNOSIS — Z6828 Body mass index (BMI) 28.0-28.9, adult: Secondary | ICD-10-CM | POA: Diagnosis not present

## 2015-07-24 DIAGNOSIS — Z01419 Encounter for gynecological examination (general) (routine) without abnormal findings: Secondary | ICD-10-CM | POA: Diagnosis not present

## 2015-08-01 DIAGNOSIS — Z79899 Other long term (current) drug therapy: Secondary | ICD-10-CM | POA: Diagnosis not present

## 2015-08-01 DIAGNOSIS — I10 Essential (primary) hypertension: Secondary | ICD-10-CM | POA: Diagnosis not present

## 2015-08-01 DIAGNOSIS — R011 Cardiac murmur, unspecified: Secondary | ICD-10-CM | POA: Diagnosis not present

## 2015-08-01 DIAGNOSIS — R635 Abnormal weight gain: Secondary | ICD-10-CM | POA: Diagnosis not present

## 2015-08-01 DIAGNOSIS — E559 Vitamin D deficiency, unspecified: Secondary | ICD-10-CM | POA: Diagnosis not present

## 2015-08-01 DIAGNOSIS — R5383 Other fatigue: Secondary | ICD-10-CM | POA: Diagnosis not present

## 2015-08-14 DIAGNOSIS — R011 Cardiac murmur, unspecified: Secondary | ICD-10-CM | POA: Diagnosis not present

## 2015-08-30 DIAGNOSIS — Z1231 Encounter for screening mammogram for malignant neoplasm of breast: Secondary | ICD-10-CM | POA: Diagnosis not present

## 2015-08-31 ENCOUNTER — Ambulatory Visit: Payer: Self-pay

## 2015-08-31 ENCOUNTER — Encounter: Payer: Medicare Other | Admitting: Women's Health

## 2015-09-03 ENCOUNTER — Encounter: Payer: Self-pay | Admitting: Obstetrics and Gynecology

## 2015-09-05 ENCOUNTER — Telehealth: Payer: Self-pay | Admitting: Genetic Counselor

## 2015-09-05 NOTE — Telephone Encounter (Signed)
Pt called to schedule genetic counseling appt. And confimed appt.  Mailed out new pt packet

## 2015-09-26 ENCOUNTER — Ambulatory Visit (HOSPITAL_BASED_OUTPATIENT_CLINIC_OR_DEPARTMENT_OTHER): Payer: Medicare Other | Admitting: Genetic Counselor

## 2015-09-26 ENCOUNTER — Encounter: Payer: Self-pay | Admitting: Genetic Counselor

## 2015-09-26 ENCOUNTER — Other Ambulatory Visit: Payer: Medicare Other

## 2015-09-26 DIAGNOSIS — Z1379 Encounter for other screening for genetic and chromosomal anomalies: Secondary | ICD-10-CM | POA: Insufficient documentation

## 2015-09-26 DIAGNOSIS — Z803 Family history of malignant neoplasm of breast: Secondary | ICD-10-CM | POA: Diagnosis not present

## 2015-09-26 DIAGNOSIS — Z8049 Family history of malignant neoplasm of other genital organs: Secondary | ICD-10-CM | POA: Insufficient documentation

## 2015-09-26 NOTE — Progress Notes (Signed)
REFERRING PROVIDER: Kennyth Arnold, [place], [place] [address]   Marylynn Pearson, MD  PRIMARY PROVIDER:  Kennyth Arnold, FNP  PRIMARY REASON FOR VISIT:  1. Family history of breast cancer   2. Family history of uterine cancer      HISTORY OF PRESENT ILLNESS:   [name], a 69 y.o. female, was seen for a [facility] cancer genetics consultation at the request of Dr. Julien Girt due to a family history of cancer.  Erica Greene presents to clinic today to discuss the possibility of a hereditary predisposition to cancer, genetic testing, and to further clarify her future cancer risks, as well as potential cancer risks for family members. Erica Greene is a 69 y.o. female with no personal history of cancer.  She had a recent mammogram which indicated that she does not have dense breast tissue, and recommends that she return in 1 year for a mammogram.  She had a TAH-BSO at age 59 for abnormal bleeding.    CANCER HISTORY:   No history exists.     HORMONAL RISK FACTORS:  Menarche was at age 56.  First live birth at age 88.  OCP use for approximately 30+ years.  Ovaries intact: no.  Hysterectomy: yes.  Menopausal status: postmenopausal.  HRT use: 0 years. Colonoscopy: yes; normal. Mammogram within the last year: yes. Number of breast biopsies: 0. Up to date with pelvic exams:  yes. Any excessive radiation exposure in the past:  no  Past Medical History  Diagnosis Date  . Hypertension   . Family history of breast cancer   . Family history of uterine cancer     Past Surgical History  Procedure Laterality Date  . Abdominal hysterectomy  1997    TOTAL ABDOMINAL HYSTERECTOMY  . Breast surgery      BREAST REDUCTION    Social History   Social History  . Marital Status: Married    Spouse Name: [name]  . Number of Children: 2  . Years of Education: N/A   Social History Main Topics  . Smoking status: Never Smoker   . Smokeless tobacco: Never Used  .  Alcohol Use: Yes     Comment: OCC  . Drug Use: None  . Sexual Activity: Yes   Other Topics Concern  . None   Social History Narrative     FAMILY HISTORY:  We obtained a detailed, 4-generation family history.  Significant diagnoses are listed below: Family History  Problem Relation Age of Onset  . Breast cancer Mother 66  . Uterine cancer Mother 58  . Cancer Father     LUNG AND THROAT - SMOKER  . Cancer Paternal Uncle     LUNG   . Cancer Paternal Uncle     LUNG  . Cancer Paternal Uncle     LUNG  . Cancer Paternal Uncle     LUNG  . Kidney failure Brother   . Cervical cancer Maternal Aunt     dx in her 110s  . Congestive Heart Failure Maternal Aunt 80  . Uterine cancer Paternal Aunt     dx >50  . Kidney failure Maternal Grandfather   . Cancer Paternal Grandmother     NOS  . Cancer Paternal Grandfather     lung and throat - smoker    The patient has two grown daughters, both of which have dense breast tissue and one has had several breast biopsies.  She also has a full brother and sister and  a paternal half brother.  Her full brother passed away at 69 from kidney failure.  Both parents are deceased.  Her mother had breast and uterine cancer at 81 and died at 61, and her father had lung and throat cancer.  Her mother had six brothers and one sister.  The sister had cervical cancer in her 57s and died of CHF at 69.  This sister had a daughter with cervical cancer and breast cancer in her 4s.  The patient's maternal grandmother was adopted and died in her 89s, and her maternal grandfather died from kidney failure in his 28s.  The patient's father had four brothers and one sister.  All brothers were smokers and died of lung cancer.  He has one sister who died from uterine cancer dx over 26.  Her paternal grandmother had cancer NOS and her grandfather had lung and throat cancer.  Patient's maternal ancestors are of [place], [place] and Caucasian descent, and paternal ancestors  are of [place] American and [place] descent. There is no reported Ashkenazi Jewish ancestry. There is no known consanguinity.  GENETIC COUNSELING ASSESSMENT: [name] is a 69 y.o. female with a family history of breast and uterine cancer which is not suggestive of a hereditary cancer syndrome.  We therefore, discussed and recommended the following at today's visit.   DISCUSSION: We reviewed the characteristics, features and inheritance patterns of hereditary cancer syndromes. Approximately 5-10% of breast cancer is inherited, with the majority of cases due to BRCA mutations.  We discussed the typical presention of hereditary cancer syndromes, including young ages of onset, breast cancer in men, multiple generations of cancer and combinations of prostate, breast, ovarian and pancreatic cancer.  We also discussed genetic testing, including the appropriate family members to test, the process of testing, insurance coverage and turn-around-time for results. We discussed with Ms. Brau that the family history is not highly consistent with a familial hereditary cancer syndrome, and we feel she is at low risk to harbor a gene mutation associated with such a condition. Thus, we did not recommend any genetic testing, at this time, and recommended Ms. Sienkiewicz continue to follow the cancer screening guidelines given by her primary healthcare provider.  We also discussed paying for testing out of pocket.    In order to estimate her chance of having a BRCA mutation, we used statistical models (Tyrer Cusik, [place] and Myriad risk calculator) and laboratory data that take into account her personal medical history, family history and ancestry.  Because each model is different, there can be a lot of variability in the risks they give.  Therefore, these numbers must be considered a rough range and not a precise risk of having a BRCA mutation.  These models estimate that she has approximately a  0.23-3% chance of having a mutation. Based on this assessment of her family and personal history, genetic testing is no recommended.  Based on the patient's personal and family history, statistical models (Tyrer Cusik)  and literature data were used to estimate her risk of developing breast cancer. This estimates her lifetime risk of developing breast cancer to be approximately 9.5%. This estimation does not take into account any genetic testing results.  The patient's lifetime breast cancer risk is a preliminary estimate based on available information using one of several models endorsed by the [place] (ACS). The ACS recommends consideration of breast MRI screening as an adjunct to mammography for patients at high risk (defined as 20% or  greater lifetime risk). A more detailed breast cancer risk assessment can be considered, if clinically indicated.   Ms. Cordner mentioned several times about her concern for her daughters based on her family history.  She reports that her daughters have been recalled for their mammograms, and one has had breast biopsies.  Further inquiry found that Ms. William's husband has prostate cancer.  He has a sister who had breast cancer in her 27s and a sister who had ovarian cancer.  Lastly, his father died with pancreatic cancer.  We discussed that her husbands family history is much more concerning for a hereditary breast cancer syndrome and that we could see him for genetic testing.  She will talk with her husband and his MD to see about a referral for genetic testing.     PLAN: Ms. Kaus declined genetic testing today.  She will talk with her sister, who she will be visiting in February, to learn more about their family history and see if anyone else may have had breast cancer.  At this time Ms. Lorenz' has two family members with breast cancer.  To meet criteria for genetic testing she must have three family members on the same side of the family with  breast and/or ovarian cancer, or someone diagnosed with breast cancer under 23.   Lastly, we encouraged Ms. Yerby to remain in contact with cancer genetics annually so that we can continuously update the family history and inform her of any changes in cancer genetics and testing that may be of benefit for this family.   Ms.  Gunner questions were answered to her satisfaction today. Our contact information was provided should additional questions or concerns arise. Thank you for the referral and allowing [place] to share in the care of your patient.   Karen P. Florene Glen, [place], [facility] Certified Genetic Counselor [email or web address] phone: [phone]  The patient was seen for a total of 60 minutes in face-to-face genetic counseling.  This patient was discussed with Drs. Magrinat, Lindi Adie and/or Burr Medico who agrees with the above.    _______________________________________________________________________ For Office Staff:  Number of people involved in session: 2 Was an Intern/ student involved with case: no

## 2015-10-30 DIAGNOSIS — R635 Abnormal weight gain: Secondary | ICD-10-CM | POA: Diagnosis not present

## 2015-10-30 DIAGNOSIS — J309 Allergic rhinitis, unspecified: Secondary | ICD-10-CM | POA: Diagnosis not present

## 2015-10-30 DIAGNOSIS — I10 Essential (primary) hypertension: Secondary | ICD-10-CM | POA: Diagnosis not present

## 2015-10-30 DIAGNOSIS — Z13220 Encounter for screening for lipoid disorders: Secondary | ICD-10-CM | POA: Diagnosis not present

## 2015-12-04 ENCOUNTER — Encounter: Payer: Self-pay | Admitting: Gastroenterology

## 2015-12-31 ENCOUNTER — Ambulatory Visit (AMBULATORY_SURGERY_CENTER): Payer: Self-pay | Admitting: *Deleted

## 2015-12-31 VITALS — Ht 59.0 in | Wt 149.0 lb

## 2015-12-31 DIAGNOSIS — Z1211 Encounter for screening for malignant neoplasm of colon: Secondary | ICD-10-CM

## 2015-12-31 MED ORDER — NA SULFATE-K SULFATE-MG SULF 17.5-3.13-1.6 GM/177ML PO SOLN: 1.0000 | Freq: Once | ORAL | Status: DC

## 2015-12-31 NOTE — Progress Notes (Signed)
No egg or soy allergy known to patient  No issues with past sedation with any surgeries  or procedures, no intubation problems - with hysterectomy had itching with sedation but no other issues  No diet pills per patient No home 02 use per patient  No blood thinners per patient  Pt denies issues with constipation  Pt states she wont be able to drink the miralax / gatorade and wants suprep , given a pt request

## 2016-01-23 ENCOUNTER — Encounter: Payer: Self-pay | Admitting: Gastroenterology

## 2016-01-23 ENCOUNTER — Ambulatory Visit (AMBULATORY_SURGERY_CENTER): Payer: Medicare Other | Admitting: Gastroenterology

## 2016-01-23 VITALS — BP 153/66 | HR 63 | Temp 98.4°F | Resp 20 | Ht 59.0 in | Wt 149.0 lb

## 2016-01-23 DIAGNOSIS — Z1211 Encounter for screening for malignant neoplasm of colon: Secondary | ICD-10-CM

## 2016-01-23 MED ORDER — SODIUM CHLORIDE 0.9 % IV SOLN: 500.0000 mL | INTRAVENOUS | Status: DC

## 2016-01-23 NOTE — Patient Instructions (Signed)
YOU HAD AN ENDOSCOPIC PROCEDURE TODAY AT [facility]:   Refer to the procedure report that was given to you for any specific questions about what was found during the examination.  If the procedure report does not answer your questions, please call your gastroenterologist to clarify.  If you requested that your care partner not be given the details of your procedure findings, then the procedure report has been included in a sealed envelope for you to review at your convenience later.  YOU SHOULD EXPECT: Some feelings of bloating in the abdomen. Passage of more gas than usual.  Walking can help get rid of the air that was put into your GI tract during the procedure and reduce the bloating. If you had a lower endoscopy (such as a colonoscopy or flexible sigmoidoscopy) you may notice spotting of blood in your stool or on the toilet paper. If you underwent a bowel prep for your procedure, you may not have a normal bowel movement for a few days.  Please Note:  You might notice some irritation and congestion in your nose or some drainage.  This is from the oxygen used during your procedure.  There is no need for concern and it should clear up in a day or so.  SYMPTOMS TO REPORT IMMEDIATELY:   Following lower endoscopy (colonoscopy or flexible sigmoidoscopy):  Excessive amounts of blood in the stool  Significant tenderness or worsening of abdominal pains  Swelling of the abdomen that is new, acute  Fever of 100F or higher  For urgent or emergent issues, a gastroenterologist can be reached at any hour by calling [phone].   DIET: Your first meal following the procedure should be a small meal and then it is ok to progress to your normal diet. Heavy or fried foods are harder to digest and may make you feel nauseous or bloated.  Likewise, meals heavy in dairy and vegetables can increase bloating.  Drink plenty of fluids but you should avoid alcoholic beverages for 24  hours.  ACTIVITY:  You should plan to take it easy for the rest of today and you should NOT DRIVE or use heavy machinery until tomorrow (because of the sedation medicines used during the test).    FOLLOW UP: Our staff will call the number listed on your records the next business day following your procedure to check on you and address any questions or concerns that you may have regarding the information given to you following your procedure. If we do not reach you, we will leave a message.  However, if you are feeling well and you are not experiencing any problems, there is no need to return our call.  We will assume that you have returned to your regular daily activities without incident.  If any biopsies were taken you will be contacted by phone or by letter within the next 1-3 weeks.  Please call us at [phone] if you have not heard about the biopsies in 3 weeks.    SIGNATURES/CONFIDENTIALITY: You and/or your care partner have signed paperwork which will be entered into your electronic medical record.  These signatures attest to the fact that that the information above on your After Visit Summary has been reviewed and is understood.  Full responsibility of the confidentiality of this discharge information lies with you and/or your care-partner.  Next screening colonoscopy in 10 years.

## 2016-01-23 NOTE — Progress Notes (Signed)
A/ox3 pleased with MAC, report to Wendy RN 

## 2016-01-23 NOTE — Op Note (Signed)
[facility] Patient Name: Erica Greene Procedure Date: 01/23/2016 8:40 AM MRN: [id number] Endoscopist: Mallie Mussel L. Loletha Carrow , MD Age: 70 Date of Birth: 04-18-1946 Gender: Female Procedure:                Colonoscopy Indications:              Screening for colorectal malignant neoplasm Medicines:                Monitored Anesthesia Care Procedure:                Pre-Anesthesia Assessment:                           - Prior to the procedure, a History and Physical                            was performed, and patient medications and                            allergies were reviewed. The patient's tolerance of                            previous anesthesia was also reviewed. The risks                            and benefits of the procedure and the sedation                            options and risks were discussed with the patient.                            All questions were answered, and informed consent                            was obtained. Prior Anticoagulants: The patient has                            taken no previous anticoagulant or antiplatelet                            agents. ASA Grade Assessment: I - A normal, healthy                            patient. After reviewing the risks and benefits,                            the patient was deemed in satisfactory condition to                            undergo the procedure.                           After obtaining informed consent, the colonoscope  was passed under direct vision. Throughout the                            procedure, the patient's blood pressure, pulse, and                            oxygen saturations were monitored continuously. The                            Model CF-HQ190L [id number]) scope was introduced                            through the anus and advanced to the the cecum,                            identified by appendiceal orifice and ileocecal          valve. The colonoscopy was somewhat difficult due                            to a tortuous colon. Successful completion of the                            procedure was aided by changing the patient to a                            supine position. The patient tolerated the                            procedure well. The quality of the bowel                            preparation was excellent. The ileocecal valve,                            appendiceal orifice, and rectum were photographed. Scope In: 8:53:00 AM Scope Out: 9:10:53 AM Scope Withdrawal Time: 0 hours 7 minutes 40 seconds  Total Procedure Duration: 0 hours 17 minutes 53 seconds  Findings:                 The perianal and digital rectal examinations were                            normal.                           The entire examined colon appeared normal on direct                            and retroflexion views. Complications:            No immediate complications. Estimated Blood Loss:     Estimated blood loss: none. Impression:               - The entire examined colon is normal on direct and  retroflexion views.                           - No specimens collected. Recommendation:           - Patient has a contact number available for                            emergencies. The signs and symptoms of potential                            delayed complications were discussed with the                            patient. Return to normal activities tomorrow.                            Written discharge instructions were provided to the                            patient.                           - Resume previous diet.                           - Continue present medications.                           - Repeat colonoscopy in 10 years for screening                            purposes. Henry L. Loletha Carrow, MD 01/23/2016 9:17:26 AM This report has been signed electronically.

## 2016-01-24 ENCOUNTER — Telehealth: Payer: Self-pay | Admitting: *Deleted

## 2016-01-24 NOTE — Telephone Encounter (Signed)
Message left

## 2016-01-29 DIAGNOSIS — I10 Essential (primary) hypertension: Secondary | ICD-10-CM | POA: Diagnosis not present

## 2016-01-29 DIAGNOSIS — R011 Cardiac murmur, unspecified: Secondary | ICD-10-CM | POA: Diagnosis not present

## 2016-01-29 DIAGNOSIS — E782 Mixed hyperlipidemia: Secondary | ICD-10-CM | POA: Diagnosis not present

## 2016-01-29 DIAGNOSIS — Z0000 Encounter for general adult medical examination without abnormal findings: Secondary | ICD-10-CM | POA: Diagnosis not present

## 2016-01-29 DIAGNOSIS — Z13220 Encounter for screening for lipoid disorders: Secondary | ICD-10-CM | POA: Diagnosis not present

## 2016-02-04 ENCOUNTER — Ambulatory Visit: Payer: BLUE CROSS/BLUE SHIELD | Admitting: Family Medicine

## 2016-04-11 DIAGNOSIS — E663 Overweight: Secondary | ICD-10-CM | POA: Diagnosis not present

## 2016-04-11 DIAGNOSIS — J309 Allergic rhinitis, unspecified: Secondary | ICD-10-CM | POA: Diagnosis not present

## 2016-04-11 DIAGNOSIS — E782 Mixed hyperlipidemia: Secondary | ICD-10-CM | POA: Diagnosis not present

## 2016-04-11 DIAGNOSIS — I10 Essential (primary) hypertension: Secondary | ICD-10-CM | POA: Diagnosis not present

## 2016-05-23 ENCOUNTER — Other Ambulatory Visit: Payer: Self-pay

## 2016-06-24 DIAGNOSIS — H2513 Age-related nuclear cataract, bilateral: Secondary | ICD-10-CM | POA: Diagnosis not present

## 2016-07-09 DIAGNOSIS — Z23 Encounter for immunization: Secondary | ICD-10-CM | POA: Diagnosis not present

## 2016-09-05 DIAGNOSIS — Z6830 Body mass index (BMI) 30.0-30.9, adult: Secondary | ICD-10-CM | POA: Diagnosis not present

## 2016-09-05 DIAGNOSIS — Z124 Encounter for screening for malignant neoplasm of cervix: Secondary | ICD-10-CM | POA: Diagnosis not present

## 2016-09-05 DIAGNOSIS — Z1231 Encounter for screening mammogram for malignant neoplasm of breast: Secondary | ICD-10-CM | POA: Diagnosis not present

## 2017-01-26 DIAGNOSIS — Z79899 Other long term (current) drug therapy: Secondary | ICD-10-CM | POA: Diagnosis not present

## 2017-01-26 DIAGNOSIS — E782 Mixed hyperlipidemia: Secondary | ICD-10-CM | POA: Diagnosis not present

## 2017-01-26 DIAGNOSIS — I10 Essential (primary) hypertension: Secondary | ICD-10-CM | POA: Diagnosis not present

## 2017-01-29 DIAGNOSIS — H6123 Impacted cerumen, bilateral: Secondary | ICD-10-CM | POA: Diagnosis not present

## 2017-01-29 DIAGNOSIS — E782 Mixed hyperlipidemia: Secondary | ICD-10-CM | POA: Diagnosis not present

## 2017-01-29 DIAGNOSIS — Z0000 Encounter for general adult medical examination without abnormal findings: Secondary | ICD-10-CM | POA: Diagnosis not present

## 2017-01-29 DIAGNOSIS — Z6830 Body mass index (BMI) 30.0-30.9, adult: Secondary | ICD-10-CM | POA: Diagnosis not present

## 2017-01-29 DIAGNOSIS — I10 Essential (primary) hypertension: Secondary | ICD-10-CM | POA: Diagnosis not present

## 2017-01-29 DIAGNOSIS — J309 Allergic rhinitis, unspecified: Secondary | ICD-10-CM | POA: Diagnosis not present

## 2017-02-11 ENCOUNTER — Encounter: Payer: Self-pay | Admitting: Gynecology

## 2017-07-13 DIAGNOSIS — Z23 Encounter for immunization: Secondary | ICD-10-CM | POA: Diagnosis not present

## 2017-07-13 DIAGNOSIS — I10 Essential (primary) hypertension: Secondary | ICD-10-CM | POA: Diagnosis not present

## 2017-07-13 DIAGNOSIS — Z79899 Other long term (current) drug therapy: Secondary | ICD-10-CM | POA: Diagnosis not present

## 2017-07-13 DIAGNOSIS — E559 Vitamin D deficiency, unspecified: Secondary | ICD-10-CM | POA: Diagnosis not present

## 2017-08-14 DIAGNOSIS — M1710 Unilateral primary osteoarthritis, unspecified knee: Secondary | ICD-10-CM | POA: Diagnosis not present

## 2017-08-14 DIAGNOSIS — Z9189 Other specified personal risk factors, not elsewhere classified: Secondary | ICD-10-CM | POA: Diagnosis not present

## 2017-08-14 DIAGNOSIS — E559 Vitamin D deficiency, unspecified: Secondary | ICD-10-CM | POA: Diagnosis not present

## 2017-08-14 DIAGNOSIS — Z23 Encounter for immunization: Secondary | ICD-10-CM | POA: Diagnosis not present

## 2017-08-14 DIAGNOSIS — I10 Essential (primary) hypertension: Secondary | ICD-10-CM | POA: Diagnosis not present

## 2017-11-20 DIAGNOSIS — M8588 Other specified disorders of bone density and structure, other site: Secondary | ICD-10-CM | POA: Diagnosis not present

## 2017-11-20 DIAGNOSIS — N958 Other specified menopausal and perimenopausal disorders: Secondary | ICD-10-CM | POA: Diagnosis not present

## 2017-11-20 DIAGNOSIS — Z1231 Encounter for screening mammogram for malignant neoplasm of breast: Secondary | ICD-10-CM | POA: Diagnosis not present

## 2017-11-20 DIAGNOSIS — Z6829 Body mass index (BMI) 29.0-29.9, adult: Secondary | ICD-10-CM | POA: Diagnosis not present

## 2017-11-20 DIAGNOSIS — Z01419 Encounter for gynecological examination (general) (routine) without abnormal findings: Secondary | ICD-10-CM | POA: Diagnosis not present

## 2017-12-14 DIAGNOSIS — M859 Disorder of bone density and structure, unspecified: Secondary | ICD-10-CM | POA: Diagnosis not present

## 2018-02-01 DIAGNOSIS — Z0000 Encounter for general adult medical examination without abnormal findings: Secondary | ICD-10-CM | POA: Diagnosis not present

## 2018-02-01 DIAGNOSIS — I10 Essential (primary) hypertension: Secondary | ICD-10-CM | POA: Diagnosis not present

## 2018-02-01 DIAGNOSIS — E559 Vitamin D deficiency, unspecified: Secondary | ICD-10-CM | POA: Diagnosis not present

## 2018-02-01 DIAGNOSIS — M8580 Other specified disorders of bone density and structure, unspecified site: Secondary | ICD-10-CM | POA: Diagnosis not present

## 2018-04-28 ENCOUNTER — Other Ambulatory Visit: Payer: Self-pay

## 2018-05-26 DIAGNOSIS — R05 Cough: Secondary | ICD-10-CM | POA: Diagnosis not present

## 2018-05-26 DIAGNOSIS — Z6829 Body mass index (BMI) 29.0-29.9, adult: Secondary | ICD-10-CM | POA: Diagnosis not present

## 2018-05-26 DIAGNOSIS — J309 Allergic rhinitis, unspecified: Secondary | ICD-10-CM | POA: Diagnosis not present

## 2018-05-26 DIAGNOSIS — H6123 Impacted cerumen, bilateral: Secondary | ICD-10-CM | POA: Diagnosis not present

## 2018-05-26 DIAGNOSIS — Z23 Encounter for immunization: Secondary | ICD-10-CM | POA: Diagnosis not present

## 2018-06-04 ENCOUNTER — Other Ambulatory Visit (INDEPENDENT_AMBULATORY_CARE_PROVIDER_SITE_OTHER): Payer: Medicare Other

## 2018-06-04 ENCOUNTER — Encounter: Payer: Self-pay | Admitting: Internal Medicine

## 2018-06-04 ENCOUNTER — Ambulatory Visit (INDEPENDENT_AMBULATORY_CARE_PROVIDER_SITE_OTHER)
Admission: RE | Admit: 2018-06-04 | Discharge: 2018-06-04 | Disposition: A | Discharge status: Home / Self Care | Payer: Medicare Other | Source: Ambulatory Visit | Attending: Internal Medicine | Admitting: Internal Medicine

## 2018-06-04 ENCOUNTER — Ambulatory Visit (INDEPENDENT_AMBULATORY_CARE_PROVIDER_SITE_OTHER): Payer: Medicare Other | Admitting: Internal Medicine

## 2018-06-04 VITALS — BP 154/88 | HR 83 | Ht <= 58 in | Wt 140.0 lb

## 2018-06-04 DIAGNOSIS — J45991 Cough variant asthma: Secondary | ICD-10-CM

## 2018-06-04 DIAGNOSIS — R05 Cough: Secondary | ICD-10-CM

## 2018-06-04 DIAGNOSIS — R059 Cough, unspecified: Secondary | ICD-10-CM

## 2018-06-04 DIAGNOSIS — I10 Essential (primary) hypertension: Secondary | ICD-10-CM | POA: Diagnosis not present

## 2018-06-04 DIAGNOSIS — R918 Other nonspecific abnormal finding of lung field: Secondary | ICD-10-CM

## 2018-06-04 LAB — NITRIC OXIDE: Nitric Oxide: 5

## 2018-06-04 LAB — CBC WITH DIFFERENTIAL/PLATELET
Basophils Absolute: 0.1 10*3/uL (ref 0.0–0.1)
Basophils Relative: 0.8 % (ref 0.0–3.0)
Eosinophils Absolute: 0.1 10*3/uL (ref 0.0–0.7)
Eosinophils Relative: 1.2 % (ref 0.0–5.0)
HCT: 39.2 % (ref 36.0–46.0)
Hemoglobin: 13.2 g/dL (ref 12.0–15.0)
LYMPHS ABS: 2.3 10*3/uL (ref 0.7–4.0)
Lymphocytes Relative: 30.4 % (ref 12.0–46.0)
MCHC: 33.6 g/dL (ref 30.0–36.0)
MCV: 85.1 fl (ref 78.0–100.0)
MONO ABS: 0.5 10*3/uL (ref 0.1–1.0)
Monocytes Relative: 6.4 % (ref 3.0–12.0)
NEUTROS PCT: 61.2 % (ref 43.0–77.0)
Neutro Abs: 4.6 10*3/uL (ref 1.4–7.7)
Platelets: 319 10*3/uL (ref 150.0–400.0)
RBC: 4.6 Mil/uL (ref 3.87–5.11)
RDW: 14.3 % (ref 11.5–15.5)
WBC: 7.5 10*3/uL (ref 4.0–10.5)

## 2018-06-04 MED ORDER — METHYLPREDNISOLONE ACETATE 80 MG/ML IJ SUSP
120.0000 mg | Freq: Once | INTRAMUSCULAR | Status: AC
  Administered 2018-06-04: 120 mg via INTRAMUSCULAR

## 2018-06-04 MED ORDER — PANTOPRAZOLE SODIUM 40 MG PO TBEC: DELAYED_RELEASE_TABLET | ORAL | 2 refills | Status: DC

## 2018-06-04 MED ORDER — BENZONATATE 200 MG PO CAPS: 200.0000 mg | ORAL_CAPSULE | Freq: Three times a day (TID) | ORAL | 1 refills | Status: DC | PRN

## 2018-06-04 NOTE — Progress Notes (Signed)
Erica Greene, female    DOB: Jul 03, 1946,    MRN: [id number]   Brief patient profile:  57 yobf never smoker from the [place] around 2014 with dx of hbp and problem cough and wheezing daily since then esp worse since late spring early summer of 2019 while maintained on ARB since at least 08/01/2017 (may have been on acei prior to that) so self referred to pulmonary clinic 06/04/2018     History of Present Illness  06/04/2018  1st Pulmonary eval / [facility]  Chief Complaint  Patient presents with  . Pulmonary Consult    Self referral. Pt c/o cough with clear sputum and wheezing x 3 months. She states the cough bothers her all day and also keeps her awake at night.   cough seems worse with meals and at hs and noct and first thing in am to point of gag/ vomit assoc throat itching/sneezing no better on singulair / clariton  Good ex tolerance =  eliptical x 30 min / some step ex just fine     No obvious day to day or daytime variability or assoc   purulent sputum or mucus plugs or hemoptysis or cp or chest tightness,   or overt   hb symptoms.    Also denies any obvious fluctuation of symptoms with weather or environmental changes or other aggravating or alleviating factors except as outlined above   No unusual exposure hx or h/o childhood pna/ asthma or knowledge of premature birth.  Current Allergies, Complete Past Medical History, Past Surgical History, Family History, and Social History were reviewed in [employer] [employer] record.  ROS  The following are not active complaints unless bolded Hoarseness, sore throat, dysphagia, dental problems, itching, sneezing,  nasal congestion or discharge of excess mucus or purulent secretions, ear ache,   fever, chills, sweats, unintended wt loss or wt gain, classically pleuritic or exertional cp,  orthopnea pnd or arm/hand swelling  or leg swelling, presyncope, palpitations, abdominal pain, anorexia, nausea, vomiting, diarrhea  or  change in bowel habits or change in bladder habits, change in stools or change in urine, dysuria, hematuria,  rash, arthralgias, visual complaints, headache, numbness, weakness or ataxia or problems with walking or coordination,  change in mood or  memory.             Past Medical History:  Diagnosis Date  . Family history of breast cancer   . Family history of uterine cancer   . Hypertension     Outpatient Medications Prior to Visit  Medication Sig Dispense Refill  . cephALEXin (KEFLEX) 500 MG capsule Take 2,000 mg by mouth as directed. Takes prior to dental work    . Cholecalciferol ([employer] VIT D 5000 HIGH-POTENCY PO) Take 1 capsule by mouth daily.    . diclofenac sodium (VOLTAREN) 1 % GEL Apply 4 g topically as directed.    . montelukast (SINGULAIR) 10 MG tablet Take 10 mg by mouth at bedtime.    Marland Kitchen olmesartan-hydrochlorothiazide (BENICAR HCT) 40-12.5 MG tablet Take 1 tablet by mouth daily.    . Turmeric 400 MG CAPS Take 1 capsule by mouth daily.    . Bioflavonoid Products (VITAMIN C/BIOFLAVONOIDS) 1000-25 MG TABS Take 2 tablets by mouth daily. Reported on 12/31/2015    . CALCIUM PO Take 1 tablet by mouth daily.    . hydrochlorothiazide (HYDRODIURIL) 25 MG tablet Take 1 tablet (25 mg total) by mouth daily. 90 tablet 1  . Multiple Vitamin (MULTIVITAMIN) tablet  Take by mouth.     No facility-administered medications prior to visit.              Objective:     BP (!) 154/88 (BP Location: Left Arm, Cuff Size: Normal)   Pulse 83   Ht 4' 9.75" (1.467 m)   Wt 140 lb (63.5 kg)   SpO2 100%   BMI 29.51 kg/m   SpO2: 100 %  RA   amb pleasant bf nad   HEENT: nl dentition, turbinates bilaterally, and oropharynx. Nl external ear canals without cough reflex   NECK :  without JVD/Nodes/TM/ nl carotid upstrokes bilaterally   LUNGS: no acc muscle use,  Nl contour chest which is clear to A and P bilaterally without cough on insp or exp maneuvers   CV:  RRR  no s3 or murmur or  increase in P2, and no edema   ABD:  soft and nontender with nl inspiratory excursion in the supine position. No bruits or organomegaly appreciated, bowel sounds nl  MS:  Nl gait/ ext warm without deformities, calf tenderness, cyanosis or clubbing No obvious joint restrictions   SKIN: warm and dry without lesions    NEURO:  alert, approp, nl sensorium with  no motor or cerebellar deficits apparent.      CXR PA and Lateral:   06/04/2018 :    I personally reviewed images and agree with radiology impression as follows:    Mild infiltrate right upper lobe. Follow-up chest x-ray recommended demonstrate clearing to exclude underlying mass.   Labs ordered 06/04/2018  Allergy profile       Assessment   Cough variant asthma vs UACS FENO 06/04/2018  =   5 - Spirometry 06/04/2018  FEV1 1.5 (117%)  Ratio 83 nl curvature  - Allergy profile 06/04/2018 >  Eos 0.1/  IgE pending  - Sinus CT 06/10/18 >>>  The most common causes of chronic cough in immunocompetent adults include the following: upper airway cough syndrome (UACS), previously referred to as postnasal drip syndrome (PNDS), which is caused by variety of rhinosinus conditions; (2) asthma; (3) GERD; (4) chronic bronchitis from cigarette smoking or other inhaled environmental irritants; (5) nonasthmatic eosinophilic bronchitis; and (6) bronchiectasis.   These conditions, singly or in combination, have accounted for up to 94% of the causes of chronic cough in prospective studies.   Other conditions have constituted no >6% of the causes in prospective studies These have included bronchogenic carcinoma, chronic interstitial pneumonia, sarcoidosis, left ventricular failure, ACEI-induced cough, and aspiration from a condition associated with pharyngeal dysfunction.    Chronic cough is often simultaneously caused by more than one condition. A single cause has been found from 38 to 82% of the time, multiple causes from 18 to 62%. Multiply caused cough has  been the result of three diseases up to 42% of the time.       Most likely this is not asthma but rather Upper airway cough syndrome (previously labeled PNDS),  is so named because it's frequently impossible to sort out how much is  CR/sinusitis with freq throat clearing (which can be related to primary GERD)   vs  causing  secondary (" extra esophageal")  GERD from wide swings in gastric pressure that occur with throat clearing, often  promoting self use of mint and menthol lozenges that reduce the lower esophageal sphincter tone and exacerbate the problem further in a cyclical fashion.   These are the same pts (now being labeled as having "irritable larynx  syndrome" by some cough centers) who not infrequently have a history of having failed to tolerate ace inhibitors,  dry powder inhalers or biphosphonates or report having atypical/extraesophageal reflux symptoms that don't respond to standard doses of PPI  and are easily confused as having aecopd or asthma flares by even experienced allergists/ pulmonologists (myself included).   Of the three most common causes of  Sub-acute / recurrent or chronic cough, only one (GERD)  can actually contribute to/ trigger  the other two (asthma and post nasal drip syndrome)  and perpetuate the cylce of cough.  While not intuitively obvious, many patients with chronic low grade reflux do not cough until there is a primary insult that disturbs the protective epithelial barrier and exposes sensitive nerve endings.   This is typically viral but can due to PNDS and  either may apply here.   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  Since she needs ct sinus 06/10/18 to complete the w/u for cough will add hrct chest - suspicion for lung ca is very low.   Essential hypertension Not optimally controlled on present regimen. I reviewed this with the patient and emphasized importance of follow-up with primary care. She may well have started coughing on acei and now on arb which usually does not cause cough but there have been reports on losartan which means we can't rule this out completely.  If a beta blocker is selected, in the setting of respiratory symptoms of unknown etiology,  It would be preferable to use bystolic, the most beta -1  selective Beta blocker available in sample form, with bisoprolol the most selective generic choice  on the market, to avoid the potential for the spillover Beta 2 effects of the less specific Beta blockers  contributing to this patient's symptoms.        Total time devoted to counseling  > 50 % of initial 60 min office visit:  review case with pt/ discussion of options/alternatives/ personally creating written customized instructions  in presence of pt  then going over those specific  Instructions directly with the pt including how to use all of the meds but in particular covering each new medication in detail and the difference between the maintenance= "automatic" meds and the prns using an action plan format for the latter (If this problem/symptom => do that organization reading Left to right).  Please see AVS from this visit for a full list of these instructions which I personally wrote for this pt and  are unique to this visit.      Christinia Gully, MD 06/04/2018

## 2018-06-04 NOTE — Patient Instructions (Signed)
The key to effective treatment for your cough is eliminating the non-stop cycle of cough you're stuck in long enough to let your airway heal completely and then see if there is anything still making you cough once you stop the cough suppression, but this should take no more than 5 days to figure out  First take delsym two tsp every 12 hours and supplement if needed with tessalon 200 mg up to every 6-8 hours to suppress the urge to cough at all or even clear your throat. Swallowing water or using ice chips/non mint and menthol containing candies (such as lifesavers or sugarless jolly ranchers) are also effective.  You should rest your voice and avoid activities that you know make you cough.  Once you have eliminated the cough for 3 straight days try reducing the tessalon first,  then the delsym as tolerated.      Protonix (pantoprazole) 40mg  Take 30- 60 min before your first and last meals of the day  plus chlorpheniramine 4 mg x 2 at bedtime (both available over the counter)  until cough is completely gone for at least a week without the need for cough suppression  GERD (REFLUX)  is an extremely common cause of respiratory symptoms, many times with no significant heartburn at all.    It can be treated with medication, but also with lifestyle changes including avoidance of late meals, excessive alcohol, smoking cessation, and avoid fatty foods, chocolate, peppermint, colas, red wine, and acidic juices such as orange juice.  NO MINT OR MENTHOL PRODUCTS SO NO COUGH DROPS   USE HARD CANDY INSTEAD (jolley ranchers or Stover's or Lifesavers (all available in sugarless versions) NO OIL BASED VITAMINS - use powdered substitutes.   Please see patient coordinator before you leave today  to schedule sinus ct   Please remember to go to the lab and x-ray department downstairs in the basement  for your tests - we will call you with the results when they are available.      Please schedule a follow up  office visit in 4 weeks, sooner if needed  with all medications /inhalers/ solutions in hand so we can verify exactly what you are taking. This includes all medications from all doctors and over the counters

## 2018-06-05 ENCOUNTER — Encounter: Payer: Self-pay | Admitting: Internal Medicine

## 2018-06-05 DIAGNOSIS — R918 Other nonspecific abnormal finding of lung field: Secondary | ICD-10-CM | POA: Insufficient documentation

## 2018-06-05 NOTE — Assessment & Plan Note (Addendum)
Not optimally controlled on present regimen. I reviewed this with the patient and emphasized importance of follow-up with primary care. She may well have started coughing on acei and now on arb which usually does not cause cough but there have been reports on losartan which means we can't rule this out completely.  If a beta blocker is selected, in the setting of respiratory symptoms of unknown etiology,  It would be preferable to use bystolic, the most beta -1  selective Beta blocker available in sample form, with bisoprolol the most selective generic choice  on the market, to avoid the potential for the spillover Beta 2 effects of the less specific Beta blockers  contributing to this patient's symptoms.        Total time devoted to counseling  > 50 % of initial 60 min office visit:  review case with pt/ discussion of options/alternatives/ personally creating written customized instructions  in presence of pt  then going over those specific  Instructions directly with the pt including how to use all of the meds but in particular covering each new medication in detail and the difference between the maintenance= "automatic" meds and the prns using an action plan format for the latter (If this problem/symptom => do that organization reading Left to right).  Please see AVS from this visit for a full list of these instructions which I personally wrote for this pt and  are unique to this visit.

## 2018-06-05 NOTE — Assessment & Plan Note (Addendum)
FENO 06/04/2018  =   5 - Spirometry 06/04/2018  FEV1 1.5 (117%)  Ratio 83 nl curvature  - Allergy profile 06/04/2018 >  Eos 0.1/  IgE pending  - Sinus CT 06/10/18 >>>  The most common causes of chronic cough in immunocompetent adults include the following: upper airway cough syndrome (UACS), previously referred to as postnasal drip syndrome (PNDS), which is caused by variety of rhinosinus conditions; (2) asthma; (3) GERD; (4) chronic bronchitis from cigarette smoking or other inhaled environmental irritants; (5) nonasthmatic eosinophilic bronchitis; and (6) bronchiectasis.   These conditions, singly or in combination, have accounted for up to 94% of the causes of chronic cough in prospective studies.   Other conditions have constituted no >6% of the causes in prospective studies These have included bronchogenic carcinoma, chronic interstitial pneumonia, sarcoidosis, left ventricular failure, ACEI-induced cough, and aspiration from a condition associated with pharyngeal dysfunction.    Chronic cough is often simultaneously caused by more than one condition. A single cause has been found from 38 to 82% of the time, multiple causes from 18 to 62%. Multiply caused cough has been the result of three diseases up to 42% of the time.       Most likely this is not asthma but rather Upper airway cough syndrome (previously labeled PNDS),  is so named because it's frequently impossible to sort out how much is  CR/sinusitis with freq throat clearing (which can be related to primary GERD)   vs  causing  secondary (" extra esophageal")  GERD from wide swings in gastric pressure that occur with throat clearing, often  promoting self use of mint and menthol lozenges that reduce the lower esophageal sphincter tone and exacerbate the problem further in a cyclical fashion.   These are the same pts (now being labeled as having "irritable larynx syndrome" by some cough centers) who not infrequently have a history of having  failed to tolerate ace inhibitors,  dry powder inhalers or biphosphonates or report having atypical/extraesophageal reflux symptoms that don't respond to standard doses of PPI  and are easily confused as having aecopd or asthma flares by even experienced allergists/ pulmonologists (myself included).   Of the three most common causes of  Sub-acute / recurrent or chronic cough, only one (GERD)  can actually contribute to/ trigger  the other two (asthma and post nasal drip syndrome)  and perpetuate the cylce of cough.  While not intuitively obvious, many patients with chronic low grade reflux do not cough until there is a primary insult that disturbs the protective epithelial barrier and exposes sensitive nerve endings.   This is typically viral but can due to PNDS and  either may apply here.   The point is that once this occurs, it is difficult to eliminate the cycle  using anything but a maximally effective acid suppression regimen at least in the short run, accompanied by an appropriate diet to address non acid GERD and control / eliminate the cough itself with tessalon for now and add tramadol next until 100% cough control if possible x min of 5 days then try wean off all cough suppression

## 2018-06-05 NOTE — Assessment & Plan Note (Signed)
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  Since she needs ct sinus 06/10/18 to complete the w/u for cough will add hrct chest - suspicion for lung ca is very low.

## 2018-06-07 ENCOUNTER — Telehealth: Payer: Self-pay | Admitting: Internal Medicine

## 2018-06-07 DIAGNOSIS — R918 Other nonspecific abnormal finding of lung field: Secondary | ICD-10-CM

## 2018-06-07 LAB — RESPIRATORY ALLERGY PROFILE REGION II ~~LOC~~
Allergen, Cedar tree, t12: <0.1 kU/L
Allergen, Comm Silver Birch, t9: <0.1 kU/L
Allergen, D pternoyssinus,d7: <0.1 kU/L
Allergen, Mouse Urine Protein, e78: <0.1 kU/L
Allergen, Mulberry, t76: <0.1 kU/L
Allergen, Oak,t7: <0.1 kU/L
Allergen, P. notatum, m1: <0.1 kU/L
Aspergillus fumigatus, m3: <0.1 kU/L
CLASS: 0
CLASS: 0
CLASS: 0
CLASS: 0
CLASS: 0
CLASS: 0
CLASS: 0
COMMON RAGWEED (SHORT) (W1) IGE: <0.1 kU/L
Cat Dander: <0.1 kU/L
Class: 0
Class: 0
Class: 0
Class: 0
Class: 0
Class: 0
Class: 0
Class: 0
Class: 0
Class: 0
Class: 0
Class: 0
Class: 0
Class: 0
Class: 0
Class: 0
Class: 0
Cockroach: <0.1 kU/L
D. farinae: <0.1 kU/L
IGE (IMMUNOGLOBULIN E), SERUM: 15 kU/L (ref <=114)
Johnson Grass: <0.1 kU/L
Pecan/Hickory Tree IgE: <0.1 kU/L
Rough Pigweed  IgE: <0.1 kU/L
Timothy Grass: <0.1 kU/L

## 2018-06-07 LAB — INTERPRETATION:

## 2018-06-07 NOTE — Telephone Encounter (Signed)
Called and spoke with Patient about CXR results.  Results and recommendations given.  Patient stated understanding and she has Sinus CT scheduled for 06/10/18 at 0800.  HRCT ordered with bilateral pulmonary infiltrates as diagnosis, and in comments requested to be done with sinus CT.  Nothing further at this time.

## 2018-06-07 NOTE — Progress Notes (Signed)
Spoke with pt and notified of results per Dr. Wert. Pt verbalized understanding and denied any questions. 

## 2018-06-07 NOTE — Progress Notes (Signed)
LMTCB

## 2018-06-10 ENCOUNTER — Other Ambulatory Visit: Payer: Self-pay | Admitting: Internal Medicine

## 2018-06-10 ENCOUNTER — Ambulatory Visit (INDEPENDENT_AMBULATORY_CARE_PROVIDER_SITE_OTHER)
Admission: RE | Admit: 2018-06-10 | Discharge: 2018-06-10 | Disposition: A | Discharge status: Home / Self Care | Payer: Medicare Other | Source: Ambulatory Visit | Attending: Internal Medicine | Admitting: Internal Medicine

## 2018-06-10 ENCOUNTER — Encounter: Payer: Self-pay | Admitting: Internal Medicine

## 2018-06-10 DIAGNOSIS — R918 Other nonspecific abnormal finding of lung field: Secondary | ICD-10-CM | POA: Diagnosis not present

## 2018-06-10 DIAGNOSIS — R059 Cough, unspecified: Secondary | ICD-10-CM

## 2018-06-10 DIAGNOSIS — R51 Headache: Secondary | ICD-10-CM | POA: Diagnosis not present

## 2018-06-10 DIAGNOSIS — J45991 Cough variant asthma: Secondary | ICD-10-CM | POA: Diagnosis not present

## 2018-06-10 DIAGNOSIS — R05 Cough: Secondary | ICD-10-CM | POA: Diagnosis not present

## 2018-06-10 DIAGNOSIS — R911 Solitary pulmonary nodule: Secondary | ICD-10-CM

## 2018-06-10 NOTE — Progress Notes (Signed)
Per MW- pt aware PET ord

## 2018-06-25 ENCOUNTER — Ambulatory Visit (HOSPITAL_COMMUNITY)
Admission: RE | Admit: 2018-06-25 | Discharge: 2018-06-25 | Disposition: A | Discharge status: Home / Self Care | Payer: Medicare Other | Source: Ambulatory Visit | Attending: Internal Medicine | Admitting: Internal Medicine

## 2018-06-25 DIAGNOSIS — R918 Other nonspecific abnormal finding of lung field: Secondary | ICD-10-CM | POA: Diagnosis not present

## 2018-06-25 DIAGNOSIS — R937 Abnormal findings on diagnostic imaging of other parts of musculoskeletal system: Secondary | ICD-10-CM | POA: Insufficient documentation

## 2018-06-25 DIAGNOSIS — R911 Solitary pulmonary nodule: Secondary | ICD-10-CM

## 2018-06-25 LAB — GLUCOSE, CAPILLARY: Glucose-Capillary: 72 mg/dL (ref 70–99)

## 2018-06-25 MED ORDER — FLUDEOXYGLUCOSE F - 18 (FDG) INJECTION
7.0000 | Freq: Once | INTRAVENOUS | Status: AC
  Administered 2018-06-25: 7 via INTRAVENOUS

## 2018-06-28 NOTE — Progress Notes (Signed)
Spoke with pt and notified of results per Dr. Wert. Pt verbalized understanding and denied any questions. 

## 2018-07-02 ENCOUNTER — Encounter: Payer: Self-pay | Admitting: Internal Medicine

## 2018-07-02 ENCOUNTER — Ambulatory Visit (INDEPENDENT_AMBULATORY_CARE_PROVIDER_SITE_OTHER): Payer: Medicare Other | Admitting: Internal Medicine

## 2018-07-02 VITALS — BP 156/86 | HR 70 | Ht <= 58 in | Wt 137.0 lb

## 2018-07-02 DIAGNOSIS — R918 Other nonspecific abnormal finding of lung field: Secondary | ICD-10-CM

## 2018-07-02 DIAGNOSIS — I10 Essential (primary) hypertension: Secondary | ICD-10-CM | POA: Diagnosis not present

## 2018-07-02 DIAGNOSIS — J45991 Cough variant asthma: Secondary | ICD-10-CM | POA: Diagnosis not present

## 2018-07-02 NOTE — Assessment & Plan Note (Signed)
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 I reviewed this with the patient and emphasized importance of follow-up with primary care and discussion re risk reduction for IHD based on CT findings as above  Says bp fine at home and excellent ex tol so no need for expedited f/u but defer this entirely to capable hands of Dr Ernie Hew   I had an extended discussion with the patient reviewing all relevant studies completed to date and  lasting 15 to 20 minutes of a 25 minute visit    Each maintenance medication was reviewed in detail including most importantly the difference between maintenance and prns and under what circumstances the prns are to be triggered using an action plan format that is not reflected in the computer generated alphabetically organized AVS.     Please see AVS for specific instructions unique to this visit that I personally wrote and verbalized to the the pt in detail and then reviewed with pt  by my nurse highlighting any  changes in therapy recommended at today's visit to their plan of care.

## 2018-07-02 NOTE — Assessment & Plan Note (Addendum)
HRCT 06/10/2018 >>> 1. No findings to suggest interstitial lung disease. 2. Small pulmonary nodules, most concerning of which is in the right upper lobe which measures 11 x 14 mm in the right upper lobe (axial image 37 of series 3), with an appearance highly concerning for primary bronchogenic adenocarcinoma. Further evaluation with PET-CT is recommended in the near future. PET 06/25/18 : 1. The 1.3 by 1.0 cm right upper lobe pulmonary nodule has a maximum SUV of 0.8, well below the blood pool activity of 2.1. As such this may represent a benign nodule, or a very low-grade adenocarcinoma of the lung. Surveillance or biopsy may be reasonable in this Circumstance.   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  Discussed in detail all the  indications, usual  risks and alternatives  relative to the benefits with patient / Audery Amel who wish  to proceed with conservative f/u    With ct in 6 months and excisional bx if any growth.

## 2018-07-02 NOTE — Patient Instructions (Signed)
Please schedule a follow up visit in 6 months but call sooner if needed with CT chest same day

## 2018-07-02 NOTE — Assessment & Plan Note (Signed)
FENO 06/04/2018  =   5 - Spirometry 06/04/2018  FEV1 1.5 (117%)  Ratio 83 nl curvature  - Allergy profile 06/04/2018 >  Eos 0.1/  IgE 15 RAST neg   - Sinus CT 06/10/2018  wnl -  HRCT 06/10/18  RUL nodule > see mpn a/p  - 07/02/2018 reported cough 100% resolved, on no rx at all p brief rx with 1st gen H1 blockers per guidelines and gerd rx > no f/u needed unless recurs, ok to use otc's next flare but no evidence at all of asthma here so no directed f/u needed in pulmonary clinic

## 2018-07-02 NOTE — Progress Notes (Signed)
Erica Greene, female    DOB: August 02, 1946,    MRN: [id number]   Brief patient profile:  10 yobf never smoker from the [place] around 2014 with dx of hbp and problem cough and wheezing daily since then esp worse since late spring early summer of 2019 while maintained on ARB since at least 08/01/2017 (may have been on acei prior to that) so self referred to pulmonary clinic 06/04/2018.    History of Present Illness  06/04/2018  1st Pulmonary eval / wert  Chief Complaint  Patient presents with  . Pulmonary Consult    Self referral. Pt c/o cough with clear sputum and wheezing x 3 months. She states the cough bothers her all day and also keeps her awake at night.   cough seems worse with meals and at hs and noct and first thing in am to point of gag/ vomit assoc throat itching/sneezing no better on singulair / clariton  Good ex tolerance =  eliptical x 30 min / some step ex just fine  rec The key to effective treatment for your cough is eliminating the non-stop cycle of cough  First take delsym two tsp every 12 hours and supplement if needed with tessalon 200 mg up to every 6-8 hours to suppress the urge to cough at all or even clear your throat.    .   Protonix (pantoprazole) 40mg  Take 30- 60 min before your first and last meals of the day  plus chlorpheniramine 4 mg x 2 at bedtime (both available over the counter)  until cough is completely gone for at least a week without the need for cough suppression GERD diet  Please see patient coordinator before you leave today  to schedule sinus ct Please remember to go to the lab and x-ray department downstairs in the basement  for your tests - we will call you with the results when they are available. Please schedule a follow up office visit in 4 weeks, sooner if needed  with all medications /inhalers/ solutions in hand so we can verify exactly what you are taking. This includes all medications from all doctors and over the  counters     07/02/2018  f/u ov/Wert re:  Cough/ MPNs Chief Complaint  Patient presents with  . Follow-up    Cough has resolved.   Dyspnea:  Not limited by breathing from desired activities  / eliptical/steps ok Cough: none now Sleeping: ok SABA use: none 02: none    No obvious day to day or daytime variability or assoc excess/ purulent sputum or mucus plugs or hemoptysis or cp or chest tightness, subjective wheeze or overt sinus or hb symptoms.   Sleeping as above  without nocturnal  or early am exacerbation  of respiratory  c/o's or need for noct saba. Also denies any obvious fluctuation of symptoms with weather or environmental changes or other aggravating or alleviating factors except as outlined above   No unusual exposure hx or h/o childhood pna/ asthma or knowledge of premature birth.  Current Allergies, Complete Past Medical History, Past Surgical History, Family History, and Social History were reviewed in [employer] record.  ROS  The following are not active complaints unless bolded Hoarseness, sore throat, dysphagia, dental problems, itching, sneezing,  nasal congestion or discharge of excess mucus or purulent secretions, ear ache,   fever, chills, sweats, unintended wt loss or wt gain, classically pleuritic or exertional cp,  orthopnea pnd or arm/hand swelling  or  leg swelling, presyncope, palpitations, abdominal pain, anorexia, nausea, vomiting, diarrhea  or change in bowel habits or change in bladder habits, change in stools or change in urine, dysuria, hematuria,  rash, arthralgias, visual complaints, headache, numbness, weakness or ataxia or problems with walking or coordination,  change in mood or  memory.        Current Meds  Medication Sig  . Cholecalciferol ([employer] VIT D 5000 HIGH-POTENCY PO) Take 1 capsule by mouth daily.  . diclofenac sodium (VOLTAREN) 1 % GEL Apply 4 g topically as directed.  . montelukast (SINGULAIR) 10 MG tablet Take 10 mg  by mouth at bedtime.  Marland Kitchen olmesartan-hydrochlorothiazide (BENICAR HCT) 40-12.5 MG tablet Take 1 tablet by mouth daily.  . Turmeric 400 MG CAPS Take 1 capsule by mouth daily.                  Objective:     amb anxious bf nad   Wt Readings from Last 3 Encounters:  07/02/18 137 lb (62.1 kg)  06/04/18 140 lb (63.5 kg)  01/23/16 149 lb (67.6 kg)     Vital signs reviewed - Note on arrival 02 sats  99% on RA   And  BP  170/90    HEENT: nl dentition, turbinates bilaterally, and oropharynx. Nl external ear canals without cough reflex   NECK :  without JVD/Nodes/TM/ nl carotid upstrokes bilaterally   LUNGS: no acc muscle use,  Nl contour chest which is clear to A and P bilaterally without cough on insp or exp maneuvers   CV:  RRR  no s3 or murmur or increase in P2, and no edema   ABD:  soft and nontender with nl inspiratory excursion in the supine position. No bruits or organomegaly appreciated, bowel sounds nl  MS:  Nl gait/ ext warm without deformities, calf tenderness, cyanosis or clubbing No obvious joint restrictions   SKIN: warm and dry without lesions    NEURO:  alert, approp, nl sensorium with  no motor or cerebellar deficits apparent.              Assessment

## 2018-07-29 DIAGNOSIS — H2513 Age-related nuclear cataract, bilateral: Secondary | ICD-10-CM | POA: Diagnosis not present

## 2018-08-03 DIAGNOSIS — E559 Vitamin D deficiency, unspecified: Secondary | ICD-10-CM | POA: Diagnosis not present

## 2018-08-03 DIAGNOSIS — Z79899 Other long term (current) drug therapy: Secondary | ICD-10-CM | POA: Diagnosis not present

## 2018-08-03 DIAGNOSIS — E782 Mixed hyperlipidemia: Secondary | ICD-10-CM | POA: Diagnosis not present

## 2018-08-03 DIAGNOSIS — I10 Essential (primary) hypertension: Secondary | ICD-10-CM | POA: Diagnosis not present

## 2018-08-03 LAB — LIPID PANEL
Cholesterol: 195 (ref 0–200)
LDL Cholesterol: 152

## 2018-08-03 LAB — HEPATIC FUNCTION PANEL
ALT: 13 (ref 7–35)
AST: 17 (ref 13–35)
Bilirubin, Total: 0.4

## 2018-08-03 LAB — BASIC METABOLIC PANEL WITH GFR
CO2: 29 — AB (ref 13–22)
Chloride: 103 (ref 99–108)
Creatinine: 0.8 (ref 0.5–1.1)
Glucose: 78
Potassium: 4.3 (ref 3.4–5.3)
Sodium: 142 (ref 137–147)

## 2018-08-03 LAB — COMPREHENSIVE METABOLIC PANEL WITH GFR
Albumin: 4.3 (ref 3.5–5.0)
Calcium: 9.6 (ref 8.7–10.7)

## 2018-08-03 LAB — TSH: TSH: 1 (ref 0.41–5.90)

## 2018-08-03 LAB — VITAMIN D 25 HYDROXY (VIT D DEFICIENCY, FRACTURES): Vit D, 25-Hydroxy: 52.8

## 2018-08-05 DIAGNOSIS — Z6829 Body mass index (BMI) 29.0-29.9, adult: Secondary | ICD-10-CM | POA: Diagnosis not present

## 2018-08-05 DIAGNOSIS — E782 Mixed hyperlipidemia: Secondary | ICD-10-CM | POA: Diagnosis not present

## 2018-08-05 DIAGNOSIS — J984 Other disorders of lung: Secondary | ICD-10-CM | POA: Diagnosis not present

## 2018-08-05 DIAGNOSIS — I10 Essential (primary) hypertension: Secondary | ICD-10-CM | POA: Diagnosis not present

## 2018-08-05 DIAGNOSIS — E559 Vitamin D deficiency, unspecified: Secondary | ICD-10-CM | POA: Diagnosis not present

## 2018-08-17 ENCOUNTER — Other Ambulatory Visit: Payer: Self-pay

## 2018-09-02 DIAGNOSIS — Z6829 Body mass index (BMI) 29.0-29.9, adult: Secondary | ICD-10-CM | POA: Diagnosis not present

## 2018-09-02 DIAGNOSIS — L821 Other seborrheic keratosis: Secondary | ICD-10-CM | POA: Diagnosis not present

## 2018-09-02 DIAGNOSIS — M1710 Unilateral primary osteoarthritis, unspecified knee: Secondary | ICD-10-CM | POA: Diagnosis not present

## 2018-09-02 DIAGNOSIS — M19041 Primary osteoarthritis, right hand: Secondary | ICD-10-CM | POA: Diagnosis not present

## 2018-09-02 DIAGNOSIS — I10 Essential (primary) hypertension: Secondary | ICD-10-CM | POA: Diagnosis not present

## 2018-09-02 DIAGNOSIS — M19042 Primary osteoarthritis, left hand: Secondary | ICD-10-CM | POA: Diagnosis not present

## 2018-11-12 ENCOUNTER — Telehealth: Payer: Self-pay | Admitting: Internal Medicine

## 2018-11-12 NOTE — Telephone Encounter (Signed)
Patient calling office to check on her CT scan. It has not been scheduled and she is supposed to see Dr. Melvyn Novas on 01/10/2019 and was to have this scan done prior to visit. Patient would also like to move up OV if CT scan is earlier

## 2018-11-12 NOTE — Telephone Encounter (Signed)
Called patient, unable to reach. Left my name and number to have patient return our call for response.

## 2018-11-12 NOTE — Telephone Encounter (Signed)
We have not scheduled this yet we will schedule in march since its start date is for 12/2018

## 2018-11-18 NOTE — Telephone Encounter (Signed)
ATC Patient.  Left message to call back when available.

## 2018-11-19 NOTE — Telephone Encounter (Signed)
Pt returning call CB# [phone]//kob

## 2018-11-19 NOTE — Telephone Encounter (Signed)
Pt is returning call CB# [phone]//kob

## 2018-11-19 NOTE — Telephone Encounter (Signed)
ATC pt, no answer. Left message for pt to call back.  

## 2018-11-22 NOTE — Telephone Encounter (Signed)
Left message for patient to call back  

## 2018-11-22 NOTE — Telephone Encounter (Signed)
Spoke with patient. She is aware that the [facility] will call her the first of March to get this scheduled. She verbalized understanding. Nothing further needed at time of call.

## 2018-12-03 DIAGNOSIS — J984 Other disorders of lung: Secondary | ICD-10-CM | POA: Diagnosis not present

## 2018-12-03 DIAGNOSIS — Z124 Encounter for screening for malignant neoplasm of cervix: Secondary | ICD-10-CM | POA: Diagnosis not present

## 2018-12-03 DIAGNOSIS — M19042 Primary osteoarthritis, left hand: Secondary | ICD-10-CM | POA: Diagnosis not present

## 2018-12-03 DIAGNOSIS — I10 Essential (primary) hypertension: Secondary | ICD-10-CM | POA: Diagnosis not present

## 2018-12-03 DIAGNOSIS — Z1231 Encounter for screening mammogram for malignant neoplasm of breast: Secondary | ICD-10-CM | POA: Diagnosis not present

## 2018-12-03 DIAGNOSIS — Z6828 Body mass index (BMI) 28.0-28.9, adult: Secondary | ICD-10-CM | POA: Diagnosis not present

## 2018-12-03 DIAGNOSIS — M19041 Primary osteoarthritis, right hand: Secondary | ICD-10-CM | POA: Diagnosis not present

## 2018-12-03 DIAGNOSIS — J309 Allergic rhinitis, unspecified: Secondary | ICD-10-CM | POA: Diagnosis not present

## 2018-12-13 ENCOUNTER — Other Ambulatory Visit: Payer: Self-pay | Admitting: Family Medicine

## 2018-12-13 DIAGNOSIS — I779 Disorder of arteries and arterioles, unspecified: Secondary | ICD-10-CM

## 2018-12-13 DIAGNOSIS — I739 Peripheral vascular disease, unspecified: Principal | ICD-10-CM

## 2018-12-28 ENCOUNTER — Telehealth: Payer: Self-pay | Admitting: *Deleted

## 2018-12-28 NOTE — Telephone Encounter (Signed)
Covid-19 travel screening questions  Have you traveled in the last 14 days? If yes where? No  Do you now or have you had a fever in the last 14 days? No  Do you have any respiratory symptoms of shortness of breath or cough now or in the last 14 days? no  Do you have a medical history of Congestive Heart Failure? NO   Do you have a medical history of lung disease? no  Do you have any family members or close contacts with diagnosed or suspected Covid-19? NO  Pt Aware of appt will call if she has any new symptoms

## 2018-12-31 ENCOUNTER — Other Ambulatory Visit: Payer: Self-pay

## 2018-12-31 ENCOUNTER — Ambulatory Visit (INDEPENDENT_AMBULATORY_CARE_PROVIDER_SITE_OTHER)
Admission: RE | Admit: 2018-12-31 | Discharge: 2018-12-31 | Disposition: A | Discharge status: Home / Self Care | Payer: Medicare Other | Source: Ambulatory Visit | Attending: Internal Medicine | Admitting: Internal Medicine

## 2018-12-31 DIAGNOSIS — R911 Solitary pulmonary nodule: Secondary | ICD-10-CM | POA: Diagnosis not present

## 2018-12-31 DIAGNOSIS — R918 Other nonspecific abnormal finding of lung field: Secondary | ICD-10-CM | POA: Diagnosis not present

## 2019-01-03 NOTE — Progress Notes (Signed)
lmtcb

## 2019-01-04 NOTE — Progress Notes (Signed)
LMTCB

## 2019-01-06 NOTE — Progress Notes (Signed)
LMTCB WILL MAIL LETTER IF NO CALL BACK BY 01/10/2019

## 2019-01-10 ENCOUNTER — Encounter: Payer: Self-pay | Admitting: Internal Medicine

## 2019-01-10 ENCOUNTER — Ambulatory Visit: Payer: Self-pay | Admitting: Internal Medicine

## 2019-01-10 ENCOUNTER — Ambulatory Visit (INDEPENDENT_AMBULATORY_CARE_PROVIDER_SITE_OTHER): Payer: Medicare Other | Admitting: Internal Medicine

## 2019-01-10 ENCOUNTER — Other Ambulatory Visit: Payer: Self-pay

## 2019-01-10 DIAGNOSIS — J45991 Cough variant asthma: Secondary | ICD-10-CM

## 2019-01-10 DIAGNOSIS — R918 Other nonspecific abnormal finding of lung field: Secondary | ICD-10-CM | POA: Diagnosis not present

## 2019-01-10 MED ORDER — BENZONATATE 200 MG PO CAPS: 200.0000 mg | ORAL_CAPSULE | Freq: Three times a day (TID) | ORAL | 1 refills | Status: DC | PRN

## 2019-01-10 MED ORDER — CETIRIZINE HCL 10 MG PO TABS: ORAL_TABLET | ORAL | Status: DC

## 2019-01-10 NOTE — Progress Notes (Signed)
Dr Melvyn Novas discussed results with the pt during phone call visit today

## 2019-01-10 NOTE — Patient Instructions (Addendum)
We will call in the tessalon 200 mg up to every 8 hours   Zyrtec 10 mg at bedtime whenever your allergies are flaring  Nasal Saline as much as possible   We will call you in early April 2021 for follow up CT chest - please call us sooner with any change in your symptoms

## 2019-01-10 NOTE — Assessment & Plan Note (Addendum)
Onset 2015 p arrived in [place] from [place] 06/04/2018  =   5 - Spirometry 06/04/2018  FEV1 1.5 (117%)  Ratio 83 nl curvature  - Allergy profile 06/04/2018 >  Eos 0.1/  IgE 15 RAST neg   - Sinus CT 06/10/2018  wnl -  HRCT 06/10/18  RUL nodule > see mpn a/p - 07/02/2018 reported cough 100% resolved, on no rx at all p brief rx with 1st gen H1 blockers per guidelines and gerd rx   - 01/10/2019 flare with onset of spring > add tessalon 200 and zyrtec prn and nasal saline rinses, continue astelin

## 2019-01-10 NOTE — Assessment & Plan Note (Signed)
Apparent on baseline cxr 05/2018 s priors  HRCT 06/10/2018 >>> 1. No findings to suggest interstitial lung disease. 2. Small pulmonary nodules, most concerning of which is in the right upper lobe which measures 11 x 14 mm in the right upper lobe (axial image 37 of series 3), with an appearance highly concerning for primary bronchogenic adenocarcinoma  PET 06/25/18 : 1. The 1.3 by 1.0 cm right upper lobe pulmonary nodule has a maximum SUV of 0.8, well below the blood pool activity of 2.1. As such this may represent a benign nodule, or a very low-grade adenocarcinoma  - CT 12/31/2018 Stable semi-solid nodule in the RIGHT upper lobe compared to PET-CT 06/25/2018.  Burtis Junes benign etiology rec one year f/u - placed in reminder file  For 12/31/2019    CT results reviewed with pt >>> Not suspicious enough for excisional bx > really only option for now is follow the Fleischner society guidelines as rec by radiology = one year unless new symptoms   Discussed in detail all the  indications, usual  risks and alternatives  relative to the benefits with patient who agrees to proceed with conservative f/u as outlined   Each maintenance medication was reviewed in detail including most importantly the difference between maintenance and as needed and under what circumstances the prns are to be used.  Please see AVS for specific  Instructions which are unique to this visit and I personally typed out  which were reviewed in detail in writing with the patient and a copy provided.

## 2019-01-10 NOTE — Progress Notes (Signed)
Erica Greene, female    DOB: 1946-07-31,    MRN: [id number]   Brief patient profile:  40 yobf never smoker from the [place] around 2014 with dx of hbp and problem cough and wheezing daily since then esp worse since late spring early summer of 2019 while maintained on ARB since at least 08/01/2017 (may have been on acei prior to that) so self referred to pulmonary clinic 06/04/2018.    History of Present Illness  06/04/2018  1st Pulmonary eval / Erica Greene  Chief Complaint  Patient presents with  . Pulmonary Consult    Self referral. Pt c/o cough with clear sputum and wheezing x 3 months. She states the cough bothers her all day and also keeps her awake at night.   cough seems worse with meals and at hs and noct and first thing in am to point of gag/ vomit assoc throat itching/sneezing no better on singulair / clariton  Good ex tolerance =  eliptical x 30 min / some step ex just fine  rec The key to effective treatment for your cough is eliminating the non-stop cycle of cough  First take delsym two tsp every 12 hours and supplement if needed with tessalon 200 mg up to every 6-8 hours to suppress the urge to cough at all or even clear your throat.    .   Protonix (pantoprazole) 40mg  Take 30- 60 min before your first and last meals of the day  plus chlorpheniramine 4 mg x 2 at bedtime (both available over the counter)  until cough is completely gone for at least a week without the need for cough suppression GERD diet  Please see patient coordinator before you leave today  to schedule sinus ct Please remember to go to the lab and x-ray department downstairs in the basement  for your tests - we will call you with the results when they are available. Please schedule a follow up office visit in 4 weeks, sooner if needed  with all medications /inhalers/ solutions in hand so we can verify exactly what you are taking. This includes all medications from all doctors and over the counters      07/02/2018  f/u ov/Erica Greene re:  Cough/ MPNs Chief Complaint  Patient presents with  . Follow-up    Cough has resolved.   Dyspnea:  Not limited by breathing from desired activities  / eliptical/steps ok Cough: none now Sleeping: ok SABA use: none 02: none  Rec F/u ct 6 m   Virtual Visit via Telephone Note 01/10/2019   I connected with Erica Greene on 01/10/19 at  9:00 AM EDT by telephone and verified that I am speaking with the correct person using two identifiers.   I discussed the limitations, risks, security and privacy concerns of performing an evaluation and management service by telephone and the availability of in person appointments. I also discussed with the patient that there may be a patient responsible charge related to this service. The patient expressed understanding and agreed to proceed.   History of Present Illness: Cough recurred mid March 2020 req tessalon 200 maybe once a day, not using any otc nasal rx  Dyspnea:  Not limited by breathing from desired activities   Cough: dry / some assoc itching sneezing and some crusting / bleeding azelastine  Sleeping: some cough hs  SABA use: none 02: none    No obvious day to day or daytime variability or assoc excess/ purulent sputum or mucus  plugs or hemoptysis or cp or chest tightness, subjective wheeze or overt sinus or hb symptoms.    Also denies any obvious fluctuation of symptoms with weather or environmental changes or other aggravating or alleviating factors except as outlined above.  No fever, ns, unintended wt loss    Meds reviewed/ med reconciliation completed         Observations/Objective: Good phonation, no cough / speaking in complete sentences.   Assessment and Plan: See problem list for active a/p's   Follow Up Instructions: See avs for instructions unique to this ov which includes revised/ updated med list     I discussed the assessment and treatment plan with the patient. The patient was  provided an opportunity to ask questions and all were answered. The patient agreed with the plan and demonstrated an understanding of the instructions.   The patient was advised to call back or seek an in-person evaluation if the symptoms worsen or if the condition fails to improve as anticipated.  I provided 30 minutes of non-face-to-face time during this encounter.   Christinia Gully, MD

## 2019-05-31 DIAGNOSIS — Z23 Encounter for immunization: Secondary | ICD-10-CM | POA: Diagnosis not present

## 2019-08-18 ENCOUNTER — Other Ambulatory Visit: Payer: Self-pay

## 2019-10-18 IMAGING — DX DG CHEST 2V
2 series · 2 of 2 positions shown · non-contrast
Comparison: No recent prior.

CLINICAL DATA: Dry cough.

EXAM:
CHEST - 2 VIEW

[chest pa]
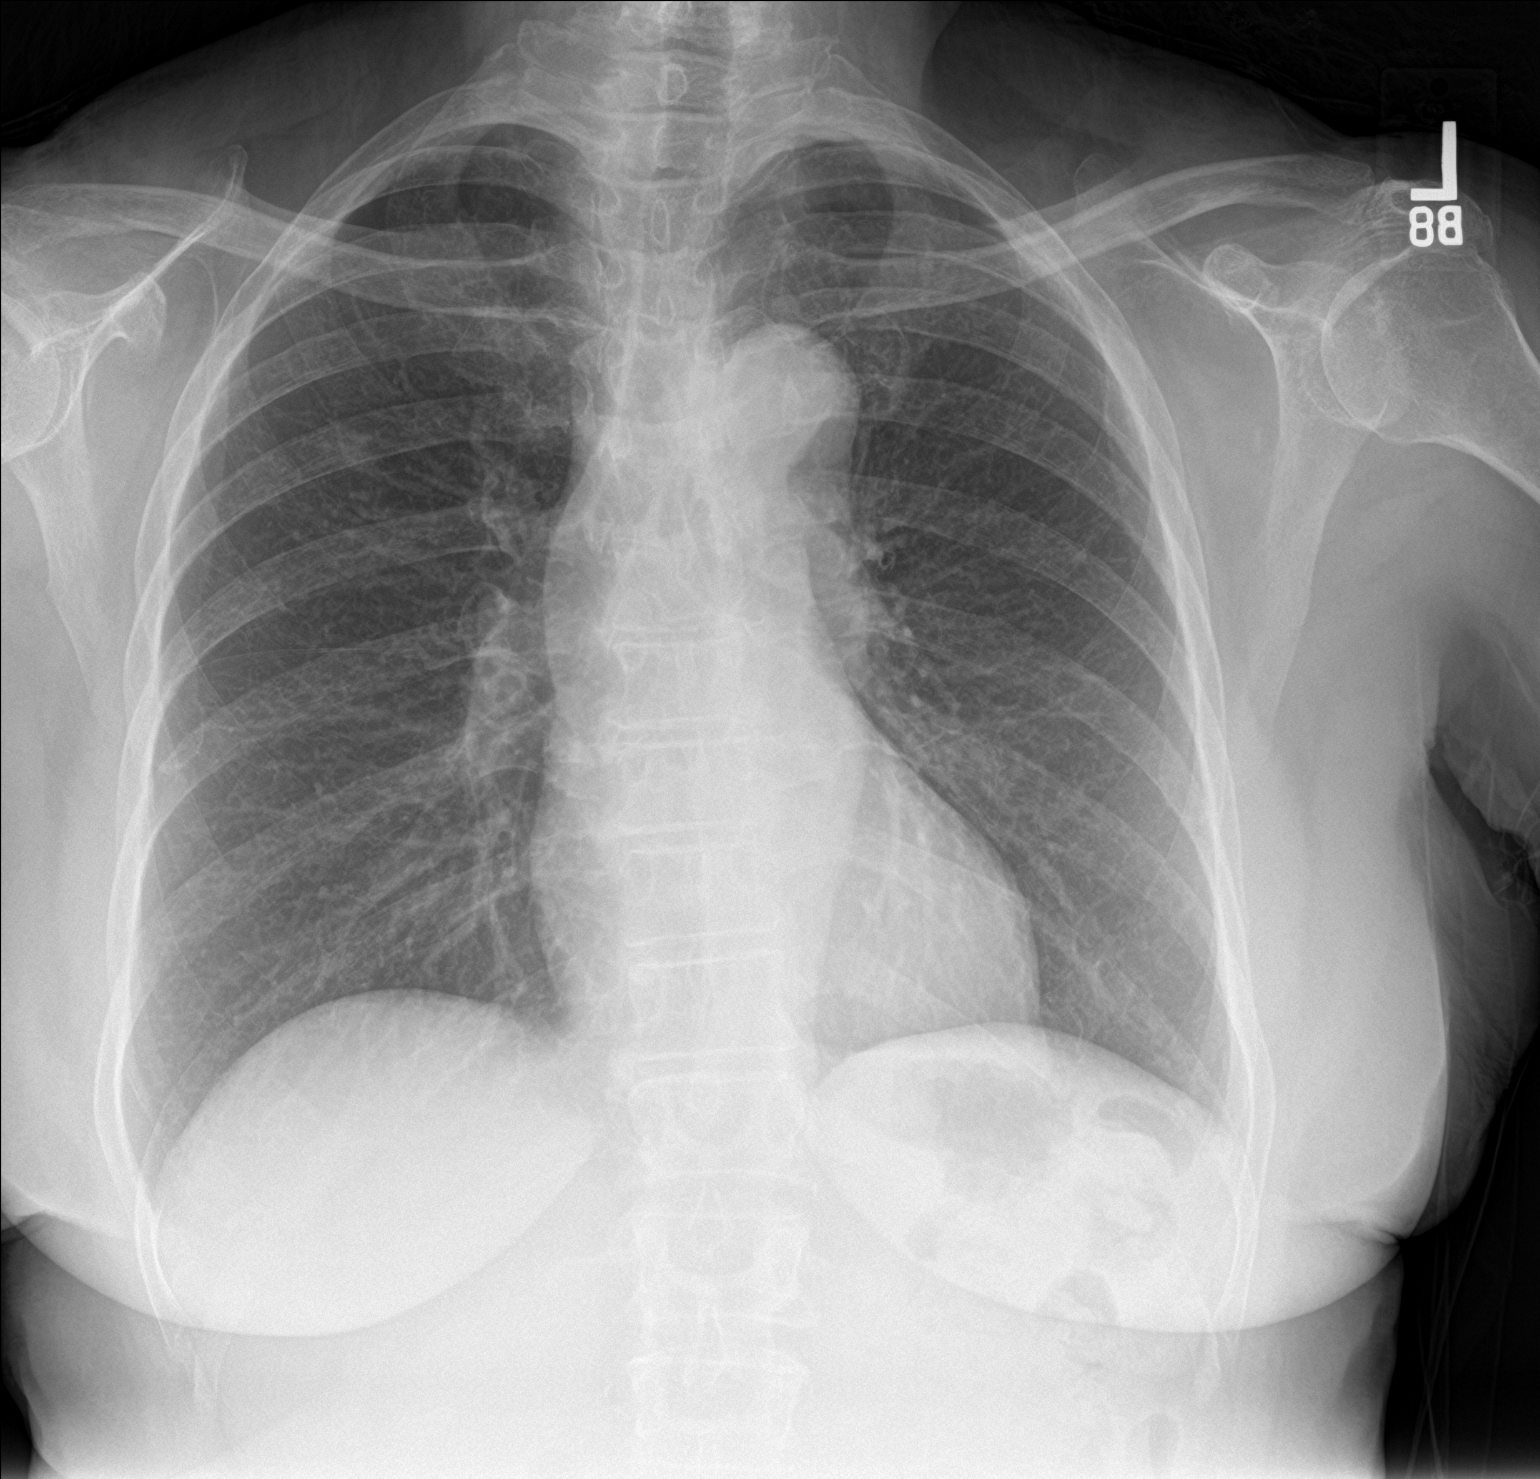

[chest lat]
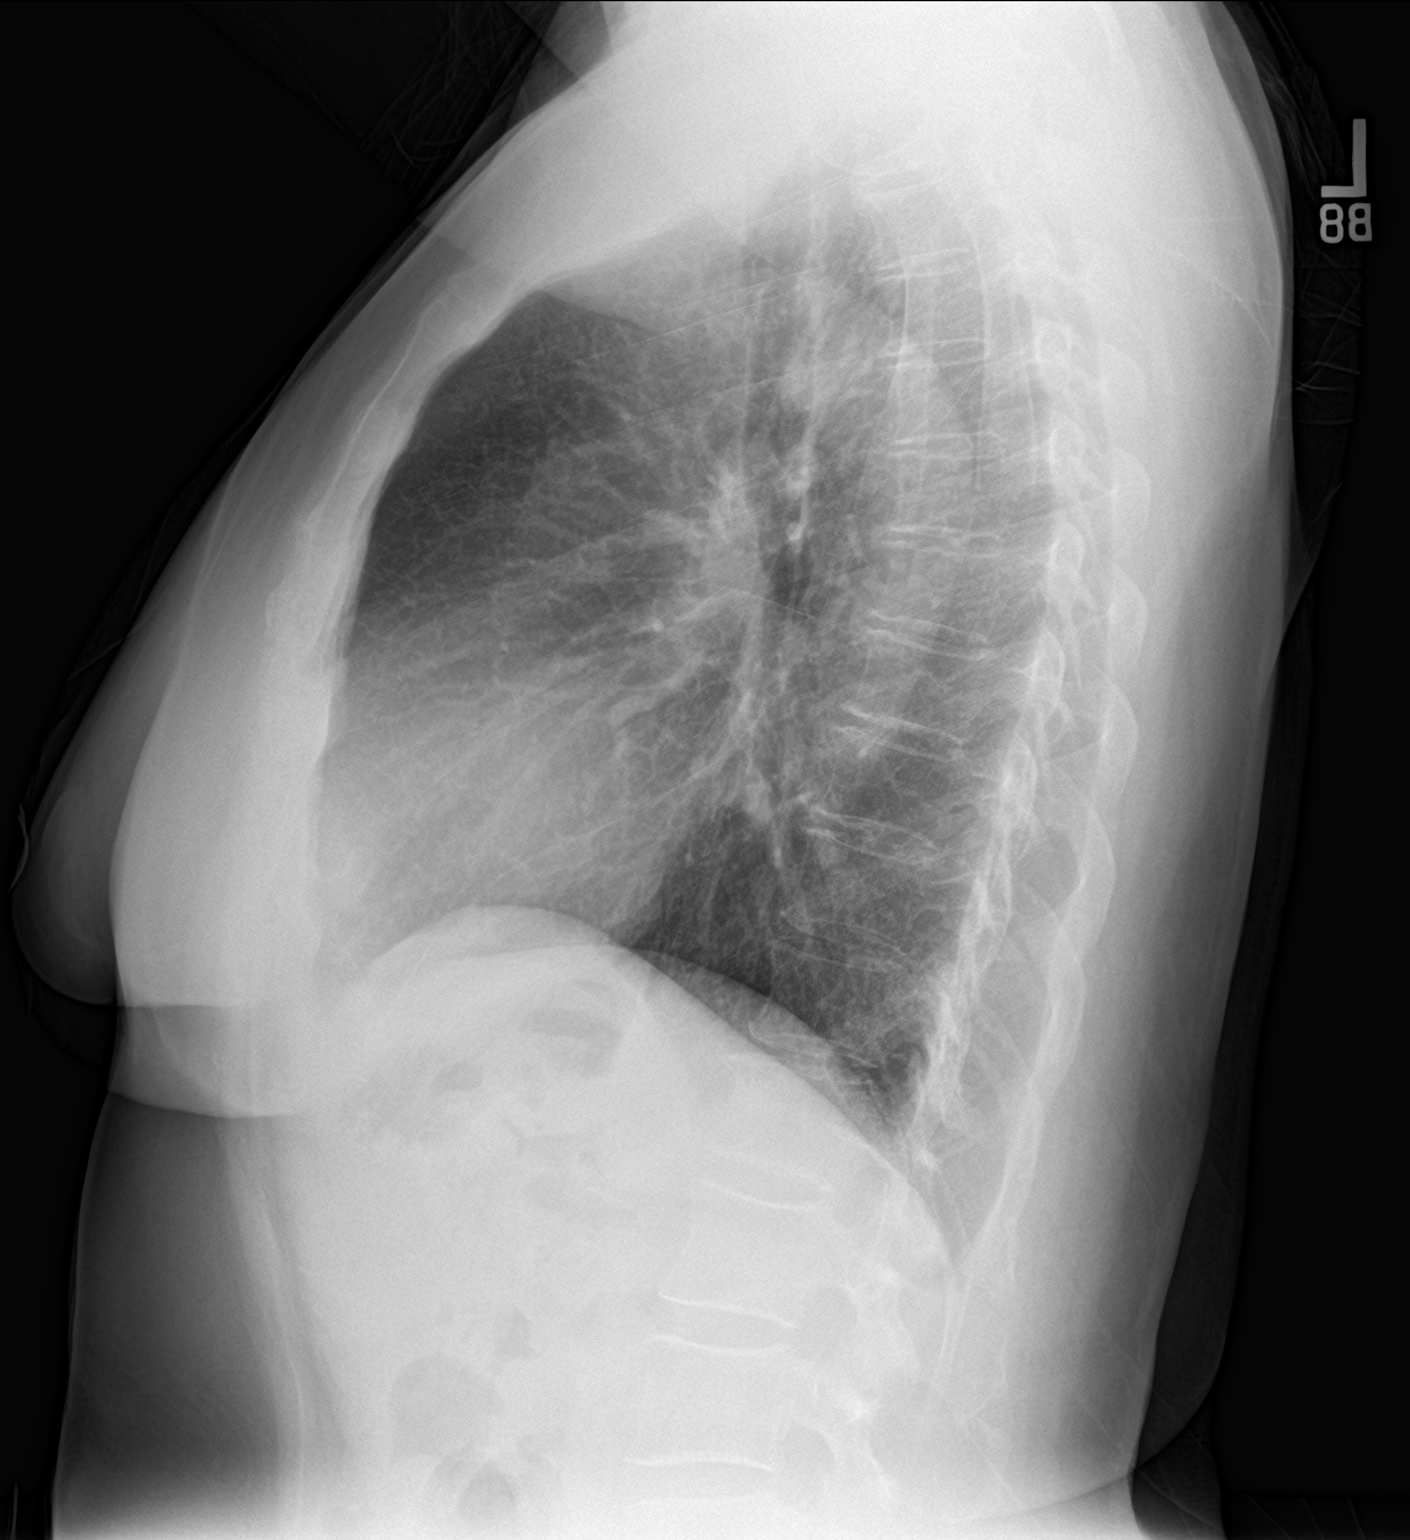

[2 of 2 positions shown; findings below may reference images not displayed]

FINDINGS: Mediastinum hilar structures normal. Mild infiltrate right upper
lung. No pleural effusion or pneumothorax.
IMPRESSION: Mild infiltrate right upper lobe. Follow-up chest x-ray recommended
demonstrate clearing to exclude underlying mass.

## 2019-10-19 ENCOUNTER — Ambulatory Visit: Payer: Medicare Other | Attending: Internal Medicine

## 2019-10-19 DIAGNOSIS — Z23 Encounter for immunization: Secondary | ICD-10-CM | POA: Insufficient documentation

## 2019-10-19 NOTE — Progress Notes (Signed)
   Covid-19 Vaccination Clinic  Name:  Erica Greene    MRN: [id number] DOB: [birth year]  10/19/2019  [name] was observed post Covid-19 immunization for 15 minutes without incidence. She was provided with Vaccine Information Sheet and instruction to access the V-Safe system.   [name] was instructed to call 911 with any severe reactions post vaccine: Marland Kitchen Difficulty breathing  . Swelling of your face and throat  . A fast heartbeat  . A bad rash all over your body  . Dizziness and weakness    Immunizations Administered    Name Date Dose VIS Date Route   Pfizer COVID-19 Vaccine 10/19/2019  3:15 PM 0.3 mL 09/09/2019 Intramuscular   Manufacturer: [place]   Lot: [id number]   [place]: [id number]

## 2019-10-26 DIAGNOSIS — L820 Inflamed seborrheic keratosis: Secondary | ICD-10-CM | POA: Diagnosis not present

## 2019-10-26 DIAGNOSIS — L84 Corns and callosities: Secondary | ICD-10-CM | POA: Diagnosis not present

## 2019-10-26 DIAGNOSIS — L821 Other seborrheic keratosis: Secondary | ICD-10-CM | POA: Diagnosis not present

## 2019-10-26 DIAGNOSIS — D2239 Melanocytic nevi of other parts of face: Secondary | ICD-10-CM | POA: Diagnosis not present

## 2019-10-26 DIAGNOSIS — D2362 Other benign neoplasm of skin of left upper limb, including shoulder: Secondary | ICD-10-CM | POA: Diagnosis not present

## 2019-11-06 ENCOUNTER — Ambulatory Visit: Payer: Medicare Other | Attending: Internal Medicine

## 2019-11-06 DIAGNOSIS — Z23 Encounter for immunization: Secondary | ICD-10-CM | POA: Insufficient documentation

## 2019-11-06 NOTE — Progress Notes (Signed)
   Covid-19 Vaccination Clinic  Name:  Erica Greene    MRN: [id number] DOB: 17-Dec-1945  11/06/2019  Erica Greene was observed post Covid-19 immunization for 30 minutes based on pre-vaccination screening without incidence. She was provided with Vaccine Information Sheet and instruction to access the V-Safe system.   Erica Greene was instructed to call 911 with any severe reactions post vaccine: Marland Kitchen Difficulty breathing  . Swelling of your face and throat  . A fast heartbeat  . A bad rash all over your body  . Dizziness and weakness    Immunizations Administered    Name Date Dose VIS Date Route   Pfizer COVID-19 Vaccine 11/06/2019 11:12 AM 0.3 mL 09/09/2019 Intramuscular   Manufacturer: [place]   Lot: [id number]   [place]: [id number]

## 2019-11-08 IMAGING — CT NM PET TUM IMG INITIAL (PI) SKULL BASE T - THIGH
8 series · 25 of 25 positions shown · non-contrast
Comparison: 06/10/2018

CLINICAL DATA: Initial treatment strategy for pulmonary nodules.

EXAM:
NUCLEAR MEDICINE PET SKULL BASE TO THIGH
TECHNIQUE: 7.0 mCi F-18 FDG was injected intravenously. Full-ring PET imaging
was performed from the skull base to thigh after the radiotracer. CT
data was obtained and used for attenuation correction and anatomic
localization.
Fasting blood glucose: 72 mg/dl

[Series 3: pet sk_thigh ac · axial · 5.0mm · 4.07mm/px · z∈[-1271,-479]mm · 5 of 199 slices shown]
[im 1/199]
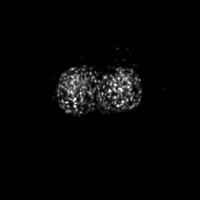
[im 50/199]
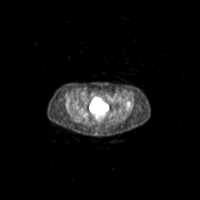
[im 100/199]
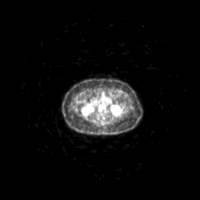
[im 149/199]
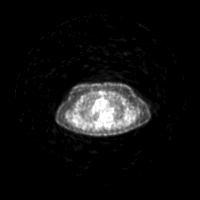
[im 199/199]
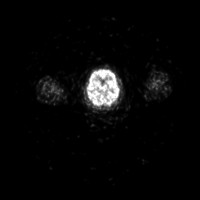

[Series 4: ct sk_thigh 5.0 b31f · axial · 5.0mm · 0.98mm/px · z∈[-1271,-479]mm · 5 of 199 slices shown]
[im 1/199]
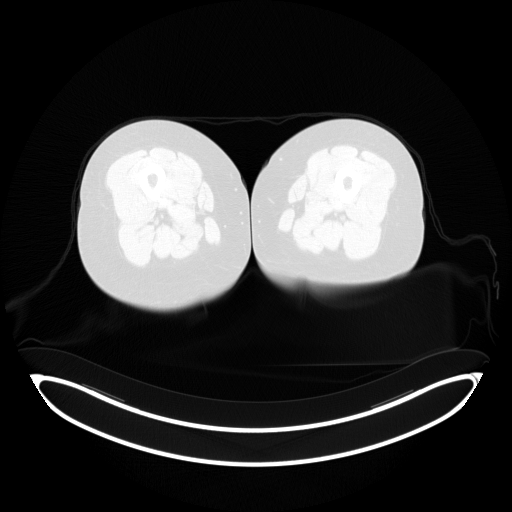
[im 50/199]
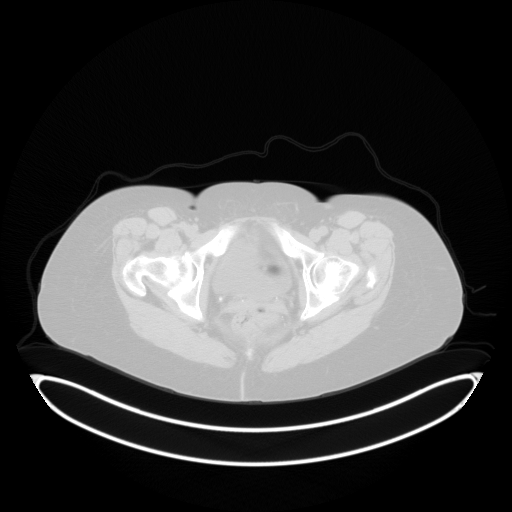
[im 100/199]
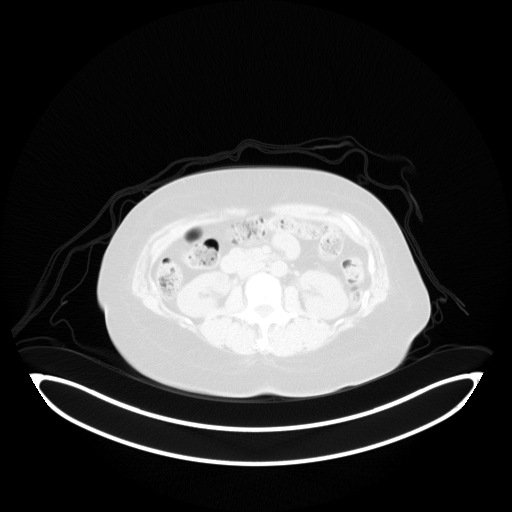
[im 149/199]
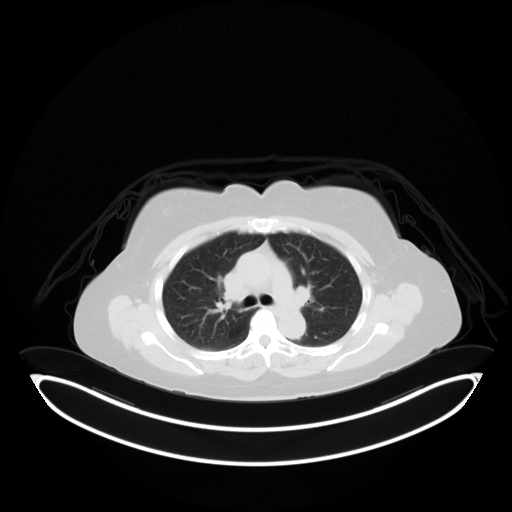
[im 199/199  brain]
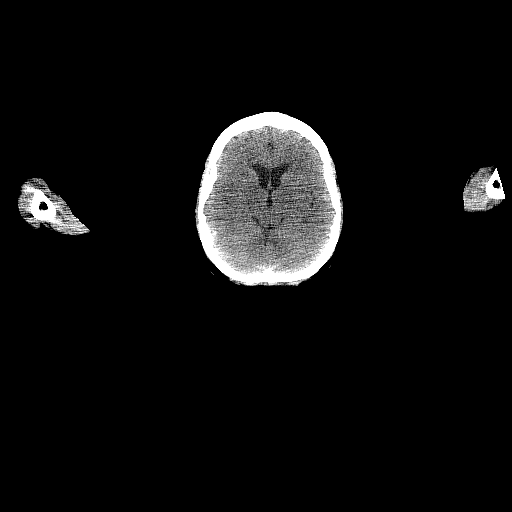

[Series 5: pet sk_thigh nac · axial · 5.0mm · 4.07mm/px · z∈[-1271,-479]mm · 5 of 199 slices shown]
[im 1/199]
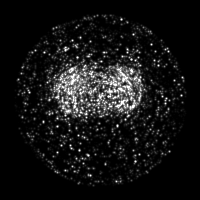
[im 50/199]
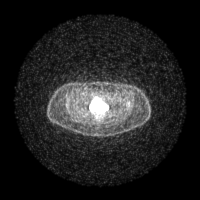
[im 100/199]
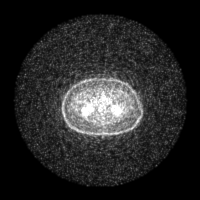
[im 149/199]
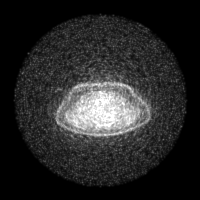
[im 199/199]
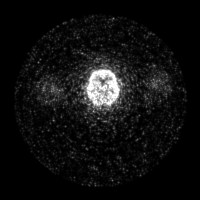

[Series 8: ct sk_thigh 5.0 b70f lung_bone · axial · 5.0mm · 0.54mm/px · 1 of 53 slices shown]
[im 1/53  bone]
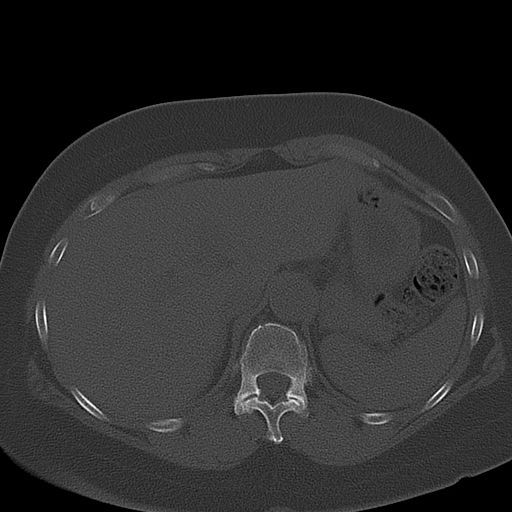

[Series 603: range-ct sk_thigh 5.0 (id)<alpha range> · 2 of 91 slices shown (1 of 2)]
[im 1/91]
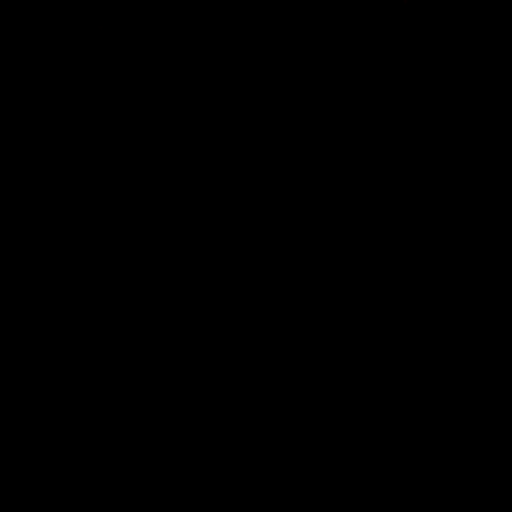
[im 91/91]
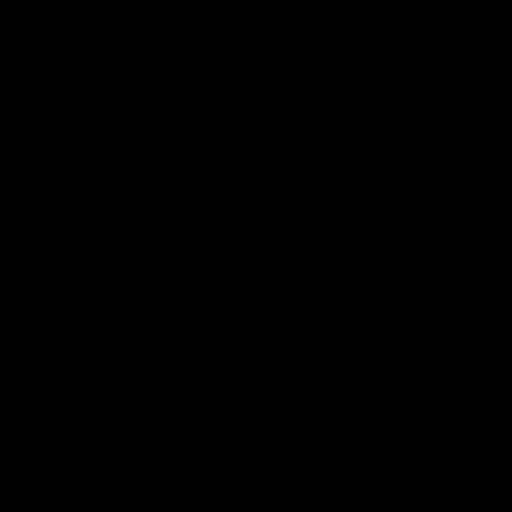

[Series 604: mip range · coronal · 1.68mm/px · 1 of 32 slices shown]
[im 1/32]
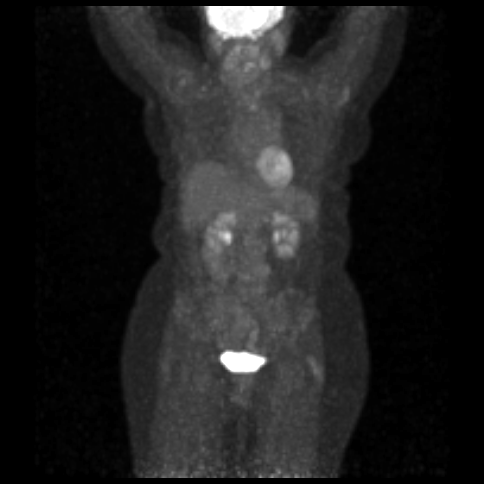

[Series 605: range-ct sk_thigh 5.0 (id)<alpha range> · 5 of 193 slices shown (2 of 2)]
[im 1/193]
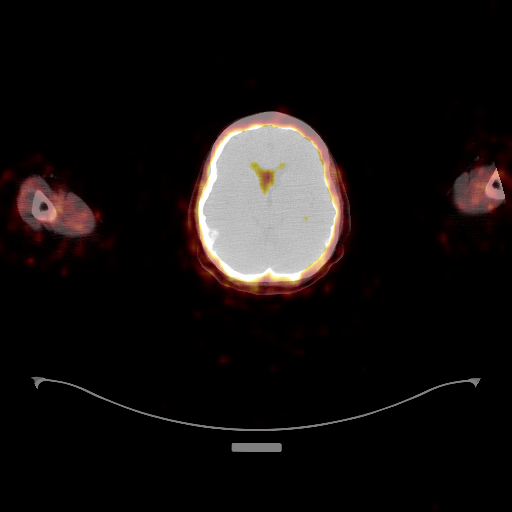
[im 49/193]
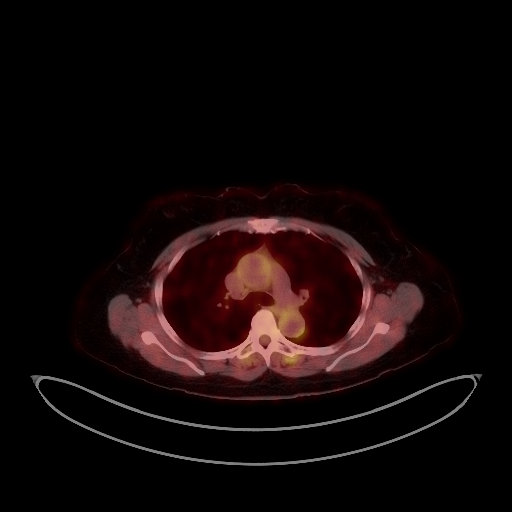
[im 97/193]
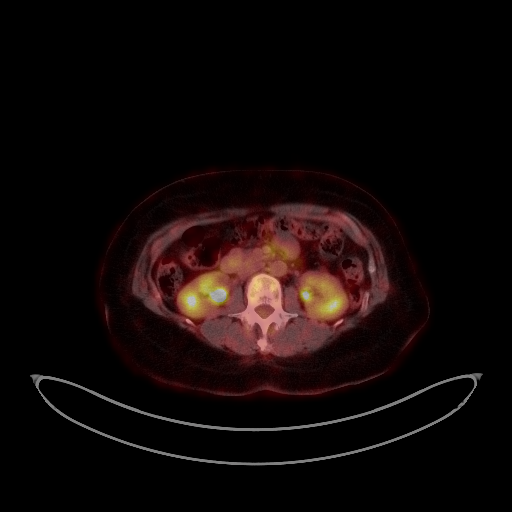
[im 145/193]
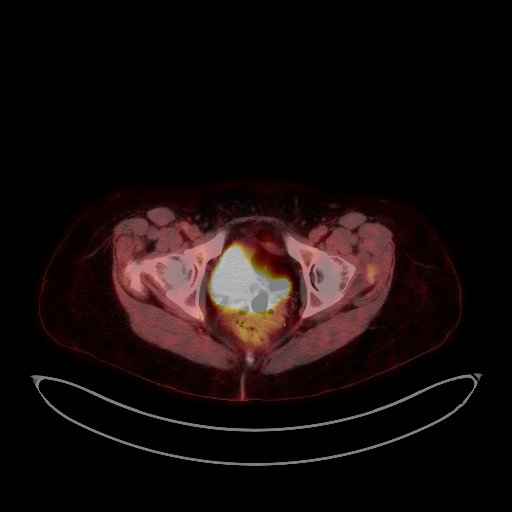
[im 193/193]
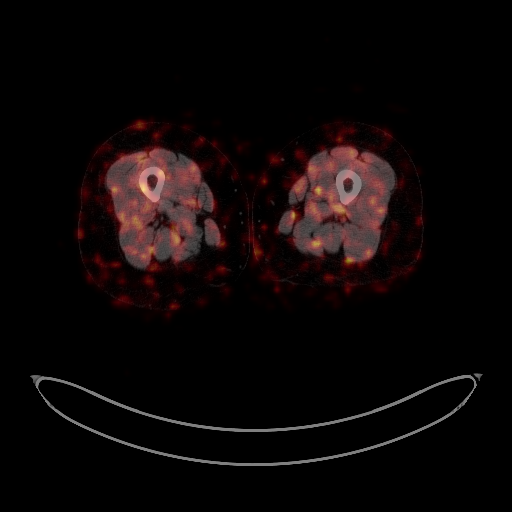

[Series 1056: results mm oncology reading · 5.0mm · 0.60mm/px · 1 of 3 slices shown]
[im 1/3]
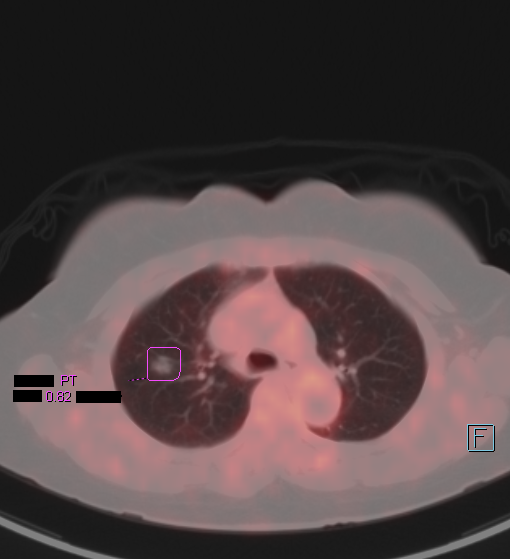

[25 of 25 positions shown; findings below may reference images not displayed]

FINDINGS: Mediastinal blood pool activity: SUV max

NECK: Activity at the level of the glottis and along the right
posterior glottic region is thought to be physiologic.

Incidental CT findings: none

CHEST: The [DATE] by 1.0 cm right upper lobe lung nodule has a maximum
SUV of 0.8. The other previously seen miniscule pulmonary nodules
are not well seen on the CT data and are not appreciably
hypermetabolic, but are below sensitive PET-CT size thresholds.

Incidental CT findings: none

ABDOMEN/PELVIS: No significant abnormal hypermetabolic activity in
this region.

Incidental CT findings: 0.9 cm exophytic hypodense lesion of the
right mid kidney has fluid density and is likely a cyst. No
associated hypermetabolic activity is observed.

SKELETON: At the T4 vertebral level there is mildly accentuated
metabolic activity without CT correlate, slightly eccentric to the
right, maximum SUV 4.7. Significance uncertain

Incidental CT findings: Mild grade 1 anterolisthesis at L4-5.
IMPRESSION: 1. The 1.3 by 1.0 cm right upper lobe pulmonary nodule has a maximum
SUV of 0.8, well below the blood pool activity of 2.1. As such this
may represent a benign nodule, or a very low-grade adenocarcinoma of
the lung. Surveillance or biopsy may be reasonable in this
circumstance.
2. Focally mildly accentuated activity in the T4 vertebral level
slightly eccentric to the right without a corresponding CT
abnormality. Presumably this could relate to some sort of benign
degenerative endplate findings; if the patient has back pain or if
otherwise clinically warranted than thoracic spine MRI with
attention to this level could be obtained.
3. Mild grade 1 anterolisthesis at the L4-5 level.

## 2019-12-29 ENCOUNTER — Other Ambulatory Visit: Payer: Self-pay | Admitting: Internal Medicine

## 2019-12-29 DIAGNOSIS — R918 Other nonspecific abnormal finding of lung field: Secondary | ICD-10-CM

## 2020-01-16 ENCOUNTER — Other Ambulatory Visit: Payer: Self-pay | Admitting: Internal Medicine

## 2020-01-17 ENCOUNTER — Other Ambulatory Visit: Payer: Self-pay

## 2020-01-17 ENCOUNTER — Ambulatory Visit
Admission: RE | Admit: 2020-01-17 | Discharge: 2020-01-17 | Disposition: A | Discharge status: Home / Self Care | Payer: Medicare Other | Source: Ambulatory Visit | Attending: Internal Medicine | Admitting: Internal Medicine

## 2020-01-17 DIAGNOSIS — R918 Other nonspecific abnormal finding of lung field: Secondary | ICD-10-CM | POA: Diagnosis not present

## 2020-01-18 NOTE — Progress Notes (Signed)
Spoke with pt and notified of results per Dr. Wert. Pt verbalized understanding and denied any questions. 

## 2020-01-31 DIAGNOSIS — Z01419 Encounter for gynecological examination (general) (routine) without abnormal findings: Secondary | ICD-10-CM | POA: Diagnosis not present

## 2020-01-31 DIAGNOSIS — Z6830 Body mass index (BMI) 30.0-30.9, adult: Secondary | ICD-10-CM | POA: Diagnosis not present

## 2020-01-31 DIAGNOSIS — N958 Other specified menopausal and perimenopausal disorders: Secondary | ICD-10-CM | POA: Diagnosis not present

## 2020-01-31 DIAGNOSIS — Z1231 Encounter for screening mammogram for malignant neoplasm of breast: Secondary | ICD-10-CM | POA: Diagnosis not present

## 2020-01-31 DIAGNOSIS — M8588 Other specified disorders of bone density and structure, other site: Secondary | ICD-10-CM | POA: Diagnosis not present

## 2020-02-15 DIAGNOSIS — H2513 Age-related nuclear cataract, bilateral: Secondary | ICD-10-CM | POA: Diagnosis not present

## 2020-03-30 ENCOUNTER — Other Ambulatory Visit: Payer: Self-pay | Admitting: Internal Medicine

## 2020-03-30 ENCOUNTER — Telehealth: Payer: Self-pay | Admitting: Internal Medicine

## 2020-03-30 MED ORDER — BENZONATATE 200 MG PO CAPS: 200.0000 mg | ORAL_CAPSULE | Freq: Three times a day (TID) | ORAL | 0 refills | Status: DC | PRN

## 2020-03-30 NOTE — Telephone Encounter (Signed)
Patient is requesting to refill Tessalon pearls 200mg  . Dr. Melvyn Novas advised to follow in a year around 12/2020 after most recent CXR .   Dr. Melvyn Novas are okay to refill this medication?

## 2020-03-30 NOTE — Telephone Encounter (Signed)
Ok  X one but will need ov for more

## 2020-03-30 NOTE — Telephone Encounter (Signed)
Spoke with the pt and notified of recs per MW  Appt scheduled  Rx refilled x 1 only

## 2020-05-08 ENCOUNTER — Ambulatory Visit: Payer: Medicare Other | Admitting: Internal Medicine

## 2020-05-30 DIAGNOSIS — Z23 Encounter for immunization: Secondary | ICD-10-CM | POA: Diagnosis not present

## 2020-06-13 ENCOUNTER — Ambulatory Visit: Payer: Medicare Other | Admitting: Internal Medicine

## 2020-06-14 ENCOUNTER — Ambulatory Visit (INDEPENDENT_AMBULATORY_CARE_PROVIDER_SITE_OTHER): Payer: Medicare Other | Admitting: Internal Medicine

## 2020-06-14 ENCOUNTER — Encounter: Payer: Self-pay | Admitting: Internal Medicine

## 2020-06-14 ENCOUNTER — Other Ambulatory Visit: Payer: Self-pay

## 2020-06-14 DIAGNOSIS — J45991 Cough variant asthma: Secondary | ICD-10-CM

## 2020-06-14 DIAGNOSIS — R918 Other nonspecific abnormal finding of lung field: Secondary | ICD-10-CM

## 2020-06-14 NOTE — Progress Notes (Signed)
Erica Greene, female    DOB: Mar 11, 1946,    MRN: [id number]   Brief patient profile:  81 yobf never smoker from the [place] around 2014 with dx of hbp and problem cough and wheezing daily since then esp worse since late spring early summer of 2019 while maintained on ARB since at least 08/01/2017 (may have been on acei prior to that) so self referred to pulmonary clinic 06/04/2018.    History of Present Illness  06/04/2018  1st Pulmonary eval / Zayyan Mullen  Chief Complaint  Patient presents with  . Pulmonary Consult    Self referral. Pt c/o cough with clear sputum and wheezing x 3 months. She states the cough bothers her all day and also keeps her awake at night.   cough seems worse with meals and at hs and noct and first thing in am to point of gag/ vomit assoc throat itching/sneezing no better on singulair / clariton  Good ex tolerance =  eliptical x 30 min / some step ex just fine  rec The key to effective treatment for your cough is eliminating the non-stop cycle of cough  First take delsym two tsp every 12 hours and supplement if needed with tessalon 200 mg up to every 6-8 hours to suppress the urge to cough at all or even clear your throat.   Protonix (pantoprazole) 40mg  Take 30- 60 min before your first and last meals of the day  plus chlorpheniramine 4 mg x 2 at bedtime (both available over the counter)  until cough is completely gone for at least a week without the need for cough suppression GERD diet  Please see patient coordinator before you leave today  to schedule sinus ct Please remember to go to the lab and x-ray department downstairs in the basement  for your tests - we will call you with the results when they are available. Please schedule a follow up office visit in 4 weeks, sooner if needed  with all medications /inhalers/ solutions in hand so we can verify exactly what you are taking. This includes all medications from all doctors and over the  counters     07/02/2018  f/u ov/Jamilex Bohnsack re:  Cough/ MPNs Chief Complaint  Patient presents with  . Follow-up    Cough has resolved.   Dyspnea:  Not limited by breathing from desired activities  / eliptical/steps ok Cough: none now Sleeping: ok SABA use: none 02: none  rec f/u in 6 m   televist 4/153/21  rec We will call in the tessalon 200 mg up to every 8 hours  Zyrtec 10 mg at bedtime whenever your allergies are flaring Nasal Saline as much as possible  We will call you in early April 2021 for follow up CT chest - please call us sooner with any change in your symptoms    06/14/2020  f/u ov/English [place] re: cough ? etiology present since 2014 / mpns Chief Complaint  Patient presents with  . Follow-up    Cough variant asthma, cough more at night  Dyspnea:  Not limited by breathing from desired activities   Cough: every noct p zyrtec in am  Sleeping: bed is flat/ 3 pillows  SABA use: none  02: none    No obvious day to day or daytime variability or assoc excess/ purulent sputum or mucus plugs or hemoptysis or cp or chest tightness, subjective wheeze or overt sinus or hb symptoms.   Sleeps  without nocturnal  or early  am exacerbation  of respiratory  c/o's or need for noct saba. Also denies any obvious fluctuation of symptoms with weather or environmental changes or other aggravating or alleviating factors except as outlined above   No unusual exposure hx or h/o childhood pna/ asthma or knowledge of premature birth.  Current Allergies, Complete Past Medical History, Past Surgical History, Family History, and Social History were reviewed in [employer] record.  ROS  The following are not active complaints unless bolded Hoarseness, sore throat, dysphagia, dental problems, itching, sneezing,  nasal congestion or discharge of excess mucus or purulent secretions, ear ache,   fever, chills, sweats, unintended wt loss or wt gain, classically pleuritic or exertional cp,   orthopnea pnd or arm/hand swelling  or leg swelling, presyncope, palpitations, abdominal pain, anorexia, nausea, vomiting, diarrhea  or change in bowel habits or change in bladder habits, change in stools or change in urine, dysuria, hematuria,  rash, arthralgias, visual complaints, headache, numbness, weakness or ataxia or problems with walking or coordination,  change in mood or  memory.        Current Meds  Medication Sig  . benzonatate (TESSALON) 200 MG capsule Take 1 capsule (200 mg total) by mouth 3 (three) times daily as needed for cough.  . cetirizine (ZYRTEC ALLERGY) 10 MG tablet Take at bedtime for itching sneezing or runny nose as needed  . Cholecalciferol ([employer] VIT D 5000 HIGH-POTENCY PO) Take 1 capsule by mouth daily.  . diclofenac sodium (VOLTAREN) 1 % GEL Apply 4 g topically as directed.  . montelukast (SINGULAIR) 10 MG tablet Take 10 mg by mouth at bedtime.  Marland Kitchen olmesartan-hydrochlorothiazide (BENICAR HCT) 40-12.5 MG tablet Take 1 tablet by mouth daily.  . Turmeric 400 MG CAPS Take 1 capsule by mouth daily.                        Objective:     amb pleasant bf nad   06/14/2020       146   07/02/18 137 lb (62.1 kg)  06/04/18 140 lb (63.5 kg)  01/23/16 149 lb (67.6 kg)     Vital signs reviewed  06/14/2020  - Note at rest 02 sats  96% on RA     HEENT : pt wearing mask not removed for exam due to covid -19 concerns.    NECK :  without JVD/Nodes/TM/ nl carotid upstrokes bilaterally   LUNGS: no acc muscle use,  Nl contour chest which is clear to A and P bilaterally without cough on insp or exp maneuvers   CV:  RRR  no s3 or murmur or increase in P2, and no edema   ABD:  soft and nontender with nl inspiratory excursion in the supine position. No bruits or organomegaly appreciated, bowel sounds nl  MS:  Nl gait/ ext warm without deformities, calf tenderness, cyanosis or clubbing No obvious joint restrictions   SKIN: warm and dry without lesions    NEURO:   alert, approp, nl sensorium with  no motor or cerebellar deficits apparent.                Assessment

## 2020-06-14 NOTE — Patient Instructions (Addendum)
Stop zyrtec and only take in am for ithcy sneezing runny nose   For drainage / throat tickle try take CHLORPHENIRAMINE  4 mg  (Chlortab 4mg   at [employer] should be easiest to find in the green box)  take one every 4 hours as needed - available over the counter- may cause drowsiness so start with just a dose or two one hour before at bedtime  If not improving after a week then add pepcid 20 mg one  An hour before bedtime  GERD (REFLUX)  is an extremely common cause of respiratory symptoms just like yours , many times with no obvious heartburn at all.    It can be treated with medication, but also with lifestyle changes including elevation of the head of your bed (ideally with 6 -8inch blocks under the headboard of your bed),  Smoking cessation, avoidance of late meals, excessive alcohol, and avoid fatty foods, chocolate, peppermint, colas, red wine, and acidic juices such as orange juice.  NO MINT OR MENTHOL PRODUCTS SO NO COUGH DROPS  USE SUGARLESS CANDY INSTEAD (Jolley ranchers or Stover's or Life Savers) or even ice chips will also do - the key is to swallow to prevent all throat clearing. NO OIL BASED VITAMINS - use powdered substitutes.  Avoid fish oil when coughing.   Stop the tessalon at this point because it's just suppressing the cough/ delsym over the counter should suffice   We will contact you as planned in April 2022 for what should be the final CT

## 2020-06-15 ENCOUNTER — Encounter: Payer: Self-pay | Admitting: Internal Medicine

## 2020-06-15 NOTE — Assessment & Plan Note (Signed)
Apparent on baseline cxr 05/2018 s priors  HRCT 06/10/2018 >>> 1. No findings to suggest interstitial lung disease. 2. Small pulmonary nodules, most concerning of which is in the right upper lobe which measures 11 x 14 mm in the right upper lobe (axial image 37 of series 3), with an appearance highly concerning for primary bronchogenic adenocarcinoma  PET 06/25/18 : 1. The 1.3 by 1.0 cm right upper lobe pulmonary nodule has a maximum SUV of 0.8, well below the blood pool activity of 2.1. As such this may represent a benign nodule, or a very low-grade adenocarcinoma  - CT 12/31/2018 Stable semi-solid nodule in the RIGHT upper lobe compared to PET-CT 06/25/2018.>   Favor benign etiology - CT 01/18/2020 1. Redemonstrated subsolid nodule of the right upper lobe measuring 1.2 x 1.1 cm, unchanged in size and composition compared to priors> rec recheck one year  Reviewed guidelines on f/u of subsolid nodules and rec to keep appt for ? Final f/u CT due 12/2020   Discussed in detail all the  indications, usual  risks and alternatives  relative to the benefits with patient who agrees to proceed with w/u as outlined.            Medical decision making was a moderate level of complexity in this case because of  two chronic conditions /diagnoses requiring extra time for  H and P, chart review, counseling,  and generating customized AVS unique to this office visit and charting.   Each maintenance medication was reviewed in detail including emphasizing most importantly the difference between maintenance and prns and under what circumstances the prns are to be triggered using an action plan format where appropriate. Please see avs for details which were reviewed in writing by both me and my nurse and patient given a written copy highlighted where appropriate with yellow highlighter for the patient's continued care at home along with an updated version of their medications.  Patient was asked to maintain medication  reconciliation by comparing this list to the actual medications being used at home and to contact this office right away if there is a conflict or discrepancy.

## 2020-06-15 NOTE — Assessment & Plan Note (Addendum)
Onset 2015 p arrived in [place] from [place] 06/04/2018  =   5 - Spirometry 06/04/2018  FEV1 1.5 (117%)  Ratio 83 nl curvature  - Allergy profile 06/04/2018 >  Eos 0.1/  IgE 15 RAST neg   - Sinus CT 06/10/2018  wnl -  HRCT 06/10/18  RUL nodule > see mpn a/p - 07/02/2018 reported cough 100% resolved, on no rx at all p brief rx with 1st gen H1 blockers per guidelines and gerd rx  - 01/10/2019 flare with onset of spring > add tessalon 200 and zyrtec prn and nasal saline rinses, continue astelin> no better 06/14/2020 so add back 1st gen H1 blockers per guidelines  And hs gerd rx   Almost entirely hs problem now so rx as did in 07/02/2018 to see if get same response - if not improving return with all meds in hand using a trust but verify approach to confirm accurate Medication  Reconciliation The principal here is that until we are certain that the  patients are doing what we've asked, it makes no sense to ask them to do more.

## 2020-06-25 DIAGNOSIS — Z23 Encounter for immunization: Secondary | ICD-10-CM | POA: Diagnosis not present

## 2020-08-16 ENCOUNTER — Encounter: Payer: Self-pay | Admitting: Family Medicine

## 2020-08-16 ENCOUNTER — Ambulatory Visit (INDEPENDENT_AMBULATORY_CARE_PROVIDER_SITE_OTHER): Payer: Medicare Other | Admitting: Family Medicine

## 2020-08-16 ENCOUNTER — Other Ambulatory Visit: Payer: Self-pay

## 2020-08-16 VITALS — BP 120/80 | HR 67 | Ht <= 58 in | Wt 141.8 lb

## 2020-08-16 DIAGNOSIS — M8580 Other specified disorders of bone density and structure, unspecified site: Secondary | ICD-10-CM | POA: Diagnosis not present

## 2020-08-16 DIAGNOSIS — I10 Essential (primary) hypertension: Secondary | ICD-10-CM | POA: Diagnosis not present

## 2020-08-16 DIAGNOSIS — E559 Vitamin D deficiency, unspecified: Secondary | ICD-10-CM | POA: Diagnosis not present

## 2020-08-16 DIAGNOSIS — M19032 Primary osteoarthritis, left wrist: Secondary | ICD-10-CM

## 2020-08-16 DIAGNOSIS — E782 Mixed hyperlipidemia: Secondary | ICD-10-CM

## 2020-08-16 LAB — VITAMIN D 25 HYDROXY (VIT D DEFICIENCY, FRACTURES)

## 2020-08-16 LAB — COMPREHENSIVE METABOLIC PANEL

## 2020-08-16 LAB — LIPID PANEL

## 2020-08-16 LAB — CBC WITH DIFFERENTIAL/PLATELET: Basophils Absolute: 0 10*3/uL (ref 0.0–0.2)

## 2020-08-16 NOTE — Patient Instructions (Signed)
It was a pleasure meeting you today.  Keep an eye on your blood pressure and if you are seeing readings consistently higher than 130/80, let me know.  I will be in touch with your lab results     [address] stands for "Dietary Approaches to Stop Hypertension." The DASH eating plan is a healthy eating plan that has been shown to reduce high blood pressure (hypertension). It may also reduce your risk for type 2 diabetes, heart disease, and stroke. The DASH eating plan may also help with weight loss. What are tips for following this plan?  General guidelines  Avoid eating more than 2,300 mg (milligrams) of salt (sodium) a day. If you have hypertension, you may need to reduce your sodium intake to 1,500 mg a day.  Limit alcohol intake to no more than 1 drink a day for nonpregnant women and 2 drinks a day for men. One drink equals 12 oz of beer, 5 oz of wine, or 1 oz of hard liquor.  Work with your health care provider to maintain a healthy body weight or to lose weight. Ask what an ideal weight is for you.  Get at least 30 minutes of exercise that causes your heart to beat faster (aerobic exercise) most days of the week. Activities may include walking, swimming, or biking.  Work with your health care provider or diet and nutrition specialist (dietitian) to adjust your eating plan to your individual calorie needs. Reading food labels   Check food labels for the amount of sodium per serving. Choose foods with less than 5 percent of the Daily Value of sodium. Generally, foods with less than 300 mg of sodium per serving fit into this eating plan.  To find whole grains, look for the word "whole" as the first word in the ingredient list. Shopping  Buy products labeled as "low-sodium" or "no salt added."  Buy fresh foods. Avoid canned foods and premade or frozen meals. Cooking  Avoid adding salt when cooking. Use salt-free seasonings or herbs instead of table salt or sea salt.  Check with your health care provider or pharmacist before using salt substitutes.  Do not fry foods. Cook foods using healthy methods such as baking, boiling, grilling, and broiling instead.  Cook with heart-healthy oils, such as olive, canola, soybean, or sunflower oil. Meal planning  Eat a balanced diet that includes: ? 5 or more servings of fruits and vegetables each day. At each meal, try to fill half of your plate with fruits and vegetables. ? Up to 6-8 servings of whole grains each day. ? Less than 6 oz of lean meat, poultry, or fish each day. A 3-oz serving of meat is about the same size as a deck of cards. One egg equals 1 oz. ? 2 servings of low-fat dairy each day. ? A serving of nuts, seeds, or beans 5 times each week. ? Heart-healthy fats. Healthy fats called Omega-3 fatty acids are found in foods such as flaxseeds and coldwater fish, like sardines, salmon, and mackerel.  Limit how much you eat of the following: ? Canned or prepackaged foods. ? Food that is high in trans fat, such as fried foods. ? Food that is high in saturated fat, such as fatty meat. ? Sweets, desserts, sugary drinks, and other foods with added sugar. ? Full-fat dairy products.  Do not salt foods before eating.  Try to eat at least 2 vegetarian meals each week.  Eat more home-cooked food and less restaurant,  buffet, and fast food.  When eating at a restaurant, ask that your food be prepared with less salt or no salt, if possible. What foods are recommended? The items listed may not be a complete list. Talk with your dietitian about what dietary choices are best for you. Grains Whole-grain or whole-wheat bread. Whole-grain or whole-wheat pasta. Brown rice. [place] Morrow. Bulgur. Whole-grain and low-sodium cereals. Pita bread. Low-fat, low-sodium crackers. Whole-wheat flour tortillas. Vegetables Fresh or frozen vegetables (raw, steamed, roasted, or grilled). Low-sodium or reduced-sodium tomato and  vegetable juice. Low-sodium or reduced-sodium tomato sauce and tomato paste. Low-sodium or reduced-sodium canned vegetables. Fruits All fresh, dried, or frozen fruit. Canned fruit in natural juice (without added sugar). Meat and other protein foods Skinless chicken or [place]. Ground chicken or [place]. Pork with fat trimmed off. Fish and seafood. Egg whites. Dried beans, peas, or lentils. Unsalted nuts, nut butters, and seeds. Unsalted canned beans. Lean cuts of beef with fat trimmed off. Low-sodium, lean deli meat. Dairy Low-fat (1%) or fat-free (skim) milk. Fat-free, low-fat, or reduced-fat cheeses. Nonfat, low-sodium ricotta or cottage cheese. Low-fat or nonfat yogurt. Low-fat, low-sodium cheese. Fats and oils Soft margarine without trans fats. Vegetable oil. Low-fat, reduced-fat, or light mayonnaise and salad dressings (reduced-sodium). Canola, safflower, olive, soybean, and sunflower oils. Avocado. Seasoning and other foods Herbs. Spices. Seasoning mixes without salt. Unsalted popcorn and pretzels. Fat-free sweets. What foods are not recommended? The items listed may not be a complete list. Talk with your dietitian about what dietary choices are best for you. Grains Baked goods made with fat, such as croissants, muffins, or some breads. Dry pasta or rice meal packs. Vegetables Creamed or fried vegetables. Vegetables in a cheese sauce. Regular canned vegetables (not low-sodium or reduced-sodium). Regular canned tomato sauce and paste (not low-sodium or reduced-sodium). Regular tomato and vegetable juice (not low-sodium or reduced-sodium). Angie Fava. Olives. Fruits Canned fruit in a light or heavy syrup. Fried fruit. Fruit in cream or butter sauce. Meat and other protein foods Fatty cuts of meat. Ribs. Fried meat. Berniece Salines. Sausage. Bologna and other processed lunch meats. Salami. Fatback. Hotdogs. Bratwurst. Salted nuts and seeds. Canned beans with added salt. Canned or smoked fish. Whole eggs or  egg yolks. Chicken or [place] with skin. Dairy Whole or 2% milk, cream, and half-and-half. Whole or full-fat cream cheese. Whole-fat or sweetened yogurt. Full-fat cheese. Nondairy creamers. Whipped toppings. Processed cheese and cheese spreads. Fats and oils Butter. Stick margarine. Lard. Shortening. Ghee. Bacon fat. Tropical oils, such as coconut, palm kernel, or palm oil. Seasoning and other foods Salted popcorn and pretzels. Onion salt, garlic salt, seasoned salt, table salt, and sea salt. Worcestershire sauce. Tartar sauce. Barbecue sauce. Teriyaki sauce. Soy sauce, including reduced-sodium. Steak sauce. Canned and packaged gravies. Fish sauce. Oyster sauce. Cocktail sauce. Horseradish that you find on the shelf. Ketchup. Mustard. Meat flavorings and tenderizers. Bouillon cubes. Hot sauce and Tabasco sauce. Premade or packaged marinades. Premade or packaged taco seasonings. Relishes. Regular salad dressings. Where to find more information:  National Heart, Lung, and [place]: https://wilson-eaton.com/  American Heart Association: www.heart.org Summary  The DASH eating plan is a healthy eating plan that has been shown to reduce high blood pressure (hypertension). It may also reduce your risk for type 2 diabetes, heart disease, and stroke.  With the DASH eating plan, you should limit salt (sodium) intake to 2,300 mg a day. If you have hypertension, you may need to reduce your sodium intake to 1,500 mg a day.  When  on the DASH eating plan, aim to eat more fresh fruits and vegetables, whole grains, lean proteins, low-fat dairy, and heart-healthy fats.  Work with your health care provider or diet and nutrition specialist (dietitian) to adjust your eating plan to your individual calorie needs. This information is not intended to replace advice given to you by your health care provider. Make sure you discuss any questions you have with your health care provider. Document Revised: 08/28/2017 Document  Reviewed: 09/08/2016 Elsevier Patient Education  2020 [employer].

## 2020-08-16 NOTE — Progress Notes (Signed)
   Subjective:    Patient ID: Erica Greene, female    DOB: 1946/02/28, 74 y.o.   MRN: [id number]  HPI Chief Complaint  Patient presents with  . new pt    new pt get established. no concerns   She is new to the practice and here to establish care.  Previous medical care: Dr. Ernie Hew   Other providers: Dr. Melvyn Novas- pulmonology  Dr. Katy Fitch- eyes  Dr. Julien Girt- OB/GYN  Dr. Loletha Carrow - GI   HTN- she was diagnosed 7 years ago when moving to [facility] She took olmesartan-HCT 40-12.5 Has not been on medication for 2 years. 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 Retired. She has lived in several different states including [place] for several years.   Denies fever, chills, dizziness, chest pain, palpitations, shortness of breath, abdominal pain, N/V/D, urinary symptoms, LE edema.     Reviewed allergies, medications, past medical, surgical, family, and social history.    Review of Systems Pertinent positives and negatives in the history of present illness.     Objective:   Physical Exam BP 120/80   Pulse 67   Ht 4\' 10"  (1.473 m)   Wt 141 lb 12.8 oz (64.3 kg)   SpO2 98%   BMI 29.64 kg/m   Alert and in no distress. Neck is supple without adenopathy or thyromegaly. Cardiac exam shows a regular sinus rhythm without murmurs or gallops. Lungs are clear to auscultation.  Extremities without edema.  Skin is warm and dry.  Normal mood, thought process       Assessment & Plan:  Essential hypertension - Plan: CBC with Differential/Platelet, Comprehensive metabolic panel -Blood pressure is in goal range today and controlled with diet and exercise.  She has not been on  medication in 2 years reportedly.  She does eat a healthy diet.  She exercises.  Discussed keeping an eye on her blood pressure at home and if she notices readings higher than 130/80 to let me know.  Mixed hyperlipidemia - Plan: Lipid panel -Recommend low-fat, low-cholesterol diet.  Check lipid panel and follow-up  Osteopenia, unspecified location -Continue getting adequate calcium in her diet, taking a vitamin D supplement and getting weightbearing exercises.  We will need to repeat DEXA when she is due for this.  Primary osteoarthritis of left wrist -Continue using Voltaren gel  Vitamin D deficiency - Plan: VITAMIN D 25 Hydroxy (Vit-D Deficiency, Fractures) -Check vitamin D level and adjust supplemental dose as needed

## 2020-08-17 ENCOUNTER — Encounter: Payer: Self-pay | Admitting: Family Medicine

## 2020-08-17 DIAGNOSIS — Z9189 Other specified personal risk factors, not elsewhere classified: Secondary | ICD-10-CM

## 2020-08-17 DIAGNOSIS — E785 Hyperlipidemia, unspecified: Secondary | ICD-10-CM | POA: Insufficient documentation

## 2020-08-17 DIAGNOSIS — E7800 Pure hypercholesterolemia, unspecified: Secondary | ICD-10-CM | POA: Insufficient documentation

## 2020-08-17 HISTORY — DX: Other specified personal risk factors, not elsewhere classified: Z91.89

## 2020-08-17 LAB — COMPREHENSIVE METABOLIC PANEL
ALT: 12 IU/L (ref 0–32)
AST: 17 IU/L (ref 0–40)
Albumin/Globulin Ratio: 1.4 (ref 1.2–2.2)
Albumin: 4.1 g/dL (ref 3.7–4.7)
Alkaline Phosphatase: 51 IU/L (ref 44–121)
BUN/Creatinine Ratio: 14 (ref 12–28)
BUN: 11 mg/dL (ref 8–27)
Bilirubin Total: 0.6 mg/dL (ref 0.0–1.2)
CO2: 26 mmol/L (ref 20–29)
Chloride: 106 mmol/L (ref 96–106)
Creatinine, Ser: 0.79 mg/dL (ref 0.57–1.00)
GFR calc Af Amer: 85 mL/min/{1.73_m2} (ref >59)
GFR calc non Af Amer: 74 mL/min/{1.73_m2} (ref >59)
Glucose: 75 mg/dL (ref 65–99)
Potassium: 5.2 mmol/L (ref 3.5–5.2)
Sodium: 144 mmol/L (ref 134–144)
Total Protein: 7.1 g/dL (ref 6.0–8.5)

## 2020-08-17 LAB — CBC WITH DIFFERENTIAL/PLATELET
Basos: 1 %
EOS (ABSOLUTE): 0.1 10*3/uL (ref 0.0–0.4)
Eos: 1 %
Hematocrit: 40.3 % (ref 34.0–46.6)
Hemoglobin: 13.4 g/dL (ref 11.1–15.9)
Immature Grans (Abs): 0 10*3/uL (ref 0.0–0.1)
Immature Granulocytes: 1 %
Lymphocytes Absolute: 2 10*3/uL (ref 0.7–3.1)
Lymphs: 31 %
MCH: 28.2 pg (ref 26.6–33.0)
MCHC: 33.3 g/dL (ref 31.5–35.7)
MCV: 85 fL (ref 79–97)
Monocytes Absolute: 0.3 10*3/uL (ref 0.1–0.9)
Monocytes: 5 %
Neutrophils Absolute: 4 10*3/uL (ref 1.4–7.0)
Neutrophils: 61 %
Platelets: 344 10*3/uL (ref 150–450)
RBC: 4.76 x10E6/uL (ref 3.77–5.28)
RDW: 12.6 % (ref 11.7–15.4)
WBC: 6.5 10*3/uL (ref 3.4–10.8)

## 2020-08-17 LAB — LIPID PANEL
Chol/HDL Ratio: 3.6 ratio (ref 0.0–4.4)
HDL: 63 mg/dL (ref >39)
Triglycerides: 56 mg/dL (ref 0–149)
VLDL Cholesterol Cal: 10 mg/dL (ref 5–40)

## 2020-08-30 ENCOUNTER — Encounter: Payer: Self-pay | Admitting: Family Medicine

## 2020-09-10 ENCOUNTER — Telehealth: Payer: Self-pay | Admitting: Family Medicine

## 2020-09-10 NOTE — Telephone Encounter (Signed)
Requested records received from Dr. Rachell Cipro.

## 2021-01-14 ENCOUNTER — Other Ambulatory Visit: Payer: Self-pay | Admitting: Internal Medicine

## 2021-01-14 DIAGNOSIS — R918 Other nonspecific abnormal finding of lung field: Secondary | ICD-10-CM

## 2021-01-25 ENCOUNTER — Ambulatory Visit
Admission: RE | Admit: 2021-01-25 | Discharge: 2021-01-25 | Disposition: A | Discharge status: Home / Self Care | Payer: Medicare Other | Source: Ambulatory Visit | Attending: Internal Medicine | Admitting: Internal Medicine

## 2021-01-25 ENCOUNTER — Other Ambulatory Visit: Payer: Self-pay

## 2021-01-25 DIAGNOSIS — R918 Other nonspecific abnormal finding of lung field: Secondary | ICD-10-CM

## 2021-01-31 NOTE — Progress Notes (Signed)
Spoke with pt and notified of results per Dr. Wert. Pt verbalized understanding and denied any questions. 

## 2021-02-04 ENCOUNTER — Ambulatory Visit: Payer: Medicare Other | Admitting: Family Medicine

## 2021-02-04 NOTE — Progress Notes (Signed)
Erica Greene is a 75 y.o. female who presents for annual wellness visit and follow-up on chronic medical conditions.  She has the following concerns:  Numerous skin tags and moles. She has a dermatologist.   HL, ASCVD risk 16.2% - states she has made healthy diet changes and prefers to have her cholesterol rechecked today. Will only take a statin if still recommended. Not in favor of medications and prefers to do all natural.   Tdap in [place] in 2013 per patient  Covid vaccines -received all 4    Immunization History  Administered Date(s) Administered  . Influenza Split 09/29/2010  . Influenza Whole 05/06/2018  . Influenza, High Dose Seasonal PF 06/25/2012  . Influenza,inj,Quad PF,6+ Mos 08/09/2013, 06/12/2014  . PFIZER(Purple Top)SARS-COV-2 Vaccination 10/19/2019, 11/06/2019, 06/25/2020, 01/02/2021  . Pneumococcal Conjugate-13 06/12/2014  . Pneumococcal Polysaccharide-23 09/29/2009  . Tdap 09/29/2006  . Zoster 09/29/2009   Last Pap smear: Erica Greene Greene 2021 Last mammogram: Erica Greene- next week Erica Greene Last colonoscopy: 2017 and due in 2027 Last DEXA: 2021 and osteopenia. Managed by Erica Greene Dentist: Erica Greene and Daily Ophtho: Erica Greene Exercise: 3-4 days a week  Other doctors caring for patient include: Erica Greene- pulmonology  Erica Greene- eyes  Erica Greene- Erica Greene  Erica Greene - GI  Dermatologist    Depression screen:  See questionnaire below.  Depression screen Erica Greene 2/9 02/05/2021 08/16/2020 06/12/2014  Decreased Interest 0 0 0  Down, Depressed, Hopeless 0 0 0  PHQ - 2 Score 0 0 0    Fall Risk Screen: see questionnaire below. Fall Risk  02/05/2021 08/16/2020 08/18/2019 08/17/2018 04/28/2018  Falls in the past year? 0 0 0 0 No  Comment - - Erica Greene: data to providers prior to load Erica Greene: data to providers prior to load Erica Greene: data to providers prior to load  Number falls in past yr: 0 0 - - -  Injury with Fall? 0 0 - -  -  Risk for fall due to : No Fall Risks - - - -  Follow up Falls evaluation completed - - - -    ADL screen:  See questionnaire below Functional Status Greene: Is the patient deaf or have difficulty hearing?: No Does the patient have difficulty seeing, even when wearing glasses/contacts?: No Does the patient have difficulty concentrating, remembering, or making decisions?: No Does the patient have difficulty walking or climbing stairs?: No Does the patient have difficulty dressing or bathing?: No Does the patient have difficulty doing errands alone such as visiting a doctor's office or shopping?: No   End of Life Discussion:  Patient has a living will and medical power of attorney. Her HCPOA is her husband.  She does not want to decide today on MOST form and code status. Will think about this.    Review of Systems Constitutional: -fever, -chills, -sweats, -unexpected weight change, -anorexia, -fatigue Allergy: -sneezing, -itching, -congestion Dermatology: denies changing moles, rash, lumps, new worrisome lesions ENT: -runny nose, -ear pain, -sore throat, -hoarseness, -sinus pain, -teeth pain, -tinnitus, -hearing loss, -epistaxis Cardiology:  -chest pain, -palpitations, -edema, -orthopnea, -paroxysmal nocturnal dyspnea Respiratory: -cough, -shortness of breath, -dyspnea on exertion, -wheezing, -hemoptysis Gastroenterology: -abdominal pain, -nausea, -vomiting, -diarrhea, -constipation, -blood in stool, -changes in bowel movement, -dysphagia Hematology: -bleeding or bruising problems Musculoskeletal: -arthralgias, -myalgias, -joint swelling, -back pain, -neck pain, -cramping, -gait changes Ophthalmology: -vision changes, -eye redness, -itching, -discharge Urology: -dysuria, -difficulty urinating, -hematuria, -urinary frequency, -urgency, incontinence Neurology: -headache, -weakness, -tingling, -numbness, -  speech abnormality, -memory loss, -falls, -dizziness Psychology:  -depressed mood,  -agitation, -sleep problems    PHYSICAL EXAM:  BP 130/76   Pulse 74   Ht 4' 9.75" (1.467 m)   Wt 141 lb 12.8 oz (64.3 kg)   BMI 29.89 kg/m   General Appearance: Alert, cooperative, no distress, appears stated age Head: Normocephalic, without obvious abnormality, atraumatic Eyes: PERRL, conjunctiva/corneas clear, EOM's intact Ears: Normal TM's and external ear canals Nose: mask on  Throat: mask on  Neck: Supple, no lymphadenopathy; thyroid: no enlargement/tenderness/nodules; no JVD Back: Spine nontender, no curvature, ROM normal, no CVA tenderness Lungs: Clear to auscultation bilaterally without wheezes, rales or ronchi; respirations unlabored Chest Wall: No tenderness or deformity Heart: Regular rate and rhythm, S1 and S2 normal, no murmur, rub or gallop Breast Exam: Erica Greene  Abdomen: Soft, non-tender, nondistended, normoactive bowel sounds, no masses, no hepatosplenomegaly Genitalia: Erica Greene  Extremities: No clubbing, cyanosis or edema Pulses: 2+ and symmetric all extremities Skin: Skin color, texture, turgor normal, no rashes or lesions. Numerous skin tags on face, neck and trunk. Numerous moles Lymph nodes: Cervical, supraclavicular, and axillary nodes normal Neurologic: CNII-XII intact, normal strength, sensation and gait Psych: Normal mood, affect, hygiene and grooming.  ASSESSMENT/PLAN: Medicare annual wellness visit, subsequent -She denies any concerns with ADLs, mood, or memory.  No falls.  She initially drew the clock on her memory test backwards but she quickly realized this and was able to draw a correct clock later in the visit.  Counseling on advanced directives.  Medications reviewed.  Immunizations reviewed.  Essential hypertension - Plan: CBC with Differential/Platelet, Comprehensive metabolic panel -Blood pressure controlled with diet and exercise.  She will keep this up.  Continue exercising  Pure hypercholesterolemia - Plan: Lipid panel -In-depth counseling  on potential long-term health consequences associated with uncontrolled hyperlipidemia and discussed her 10-year ASCVD risk which is significantly elevated.  She prefers to try and do this naturally so she has been eating a healthier diet over the past 6 months.  We will recheck her lipid panel today.  10 year risk of MI or stroke 7.5% or greater - Plan: Lipid panel -Declines statin for now but I will recheck her lipid panel today.  See above note regarding HL  Need for hepatitis C screening test - Plan: Hepatitis C antibody -Done per screening guidelines  Osteopenia, unspecified location -Continue getting adequate calcium in her diet, vitamin D supplement and weightbearing exercises.  This is managed by her Erica Greene.  Multiple acquired skin tags  Multiple nevi -She will call and schedule with her dermatologist     Discussed monthly self breast exams and yearly mammograms; at least 30 minutes of aerobic activity at least 5 days/week and weight-bearing exercise 2x/week; proper sunscreen use reviewed; healthy diet, including goals of calcium and vitamin D intake and alcohol recommendations (less than or equal to 1 drink/day) reviewed; regular seatbelt use; changing batteries in smoke detectors.  Immunization recommendations discussed.  Colonoscopy recommendations reviewed   Medicare Attestation I have personally reviewed: The patient's medical and social history Their use of alcohol, tobacco or illicit drugs Their current medications and supplements The patient's functional ability including ADLs,fall risks, home safety risks, cognitive, and hearing and visual impairment Diet and physical activities Evidence for depression or mood disorders  The patient's weight, height, and BMI have been recorded in the chart.  I have made referrals, counseling, and provided education to the patient based on review of the above and I have provided the patient  with a written personalized care plan for  preventive services.     Harland Dingwall, NP-C   02/05/2021

## 2021-02-05 ENCOUNTER — Other Ambulatory Visit: Payer: Self-pay

## 2021-02-05 ENCOUNTER — Ambulatory Visit (INDEPENDENT_AMBULATORY_CARE_PROVIDER_SITE_OTHER): Payer: Medicare Other | Admitting: Family Medicine

## 2021-02-05 ENCOUNTER — Encounter: Payer: Self-pay | Admitting: Family Medicine

## 2021-02-05 VITALS — BP 130/76 | HR 74 | Ht <= 58 in | Wt 141.8 lb

## 2021-02-05 DIAGNOSIS — I10 Essential (primary) hypertension: Secondary | ICD-10-CM

## 2021-02-05 DIAGNOSIS — L918 Other hypertrophic disorders of the skin: Secondary | ICD-10-CM

## 2021-02-05 DIAGNOSIS — E7800 Pure hypercholesterolemia, unspecified: Secondary | ICD-10-CM | POA: Diagnosis not present

## 2021-02-05 DIAGNOSIS — M8580 Other specified disorders of bone density and structure, unspecified site: Secondary | ICD-10-CM | POA: Diagnosis not present

## 2021-02-05 DIAGNOSIS — Z1159 Encounter for screening for other viral diseases: Secondary | ICD-10-CM | POA: Diagnosis not present

## 2021-02-05 DIAGNOSIS — Z9189 Other specified personal risk factors, not elsewhere classified: Secondary | ICD-10-CM | POA: Diagnosis not present

## 2021-02-05 DIAGNOSIS — Z0000 Encounter for general adult medical examination without abnormal findings: Secondary | ICD-10-CM | POA: Diagnosis not present

## 2021-02-05 DIAGNOSIS — D229 Melanocytic nevi, unspecified: Secondary | ICD-10-CM

## 2021-02-05 LAB — COMPREHENSIVE METABOLIC PANEL
AST: 21 IU/L (ref 0–40)
Albumin: 4.2 g/dL (ref 3.7–4.7)
BUN: 15 mg/dL (ref 8–27)
Bilirubin Total: 0.5 mg/dL (ref 0.0–1.2)
CO2: 22 mmol/L (ref 20–29)
Sodium: 143 mmol/L (ref 134–144)

## 2021-02-05 LAB — CBC WITH DIFFERENTIAL/PLATELET: MCHC: 32.8 g/dL (ref 31.5–35.7)

## 2021-02-05 NOTE — Patient Instructions (Addendum)
  Ms. Higdon , Thank you for taking time to come for your Medicare Wellness Visit. I appreciate your ongoing commitment to your health goals. Please review the following plan we discussed and let me know if I can assist you in the future.   These are the goals we discussed:  I recommend following up with your dermatologist.   Continue eating a low fat, low cholesterol diet and stay physically active (150 minutes of exercise per week)  Consider your code status and whether you would want to be resuscitated or not and bring in your advance directives at your convenience.   We will be in touch with your lab results.     This is a list of the screening recommended for you and due dates:  Health Maintenance  Topic Date Due  . Hepatitis C Screening: USPSTF Recommendation to screen - Ages 81-79 yo.  Never done  . Pneumonia vaccines (2 of 2 - PPSV23) 06/13/2015  . Tetanus Vaccine  09/29/2016  . Flu Shot  04/29/2021  . Colon Cancer Screening  01/22/2026  . DEXA scan (bone density measurement)  Completed  . COVID-19 Vaccine  Completed  . HPV Vaccine  Aged Out

## 2021-02-06 LAB — COMPREHENSIVE METABOLIC PANEL
ALT: 13 IU/L (ref 0–32)
Albumin/Globulin Ratio: 1.7 (ref 1.2–2.2)
Alkaline Phosphatase: 55 IU/L (ref 44–121)
BUN/Creatinine Ratio: 20 (ref 12–28)
Calcium: 9.8 mg/dL (ref 8.7–10.3)
Chloride: 104 mmol/L (ref 96–106)
Creatinine, Ser: 0.74 mg/dL (ref 0.57–1.00)
Globulin, Total: 2.5 g/dL (ref 1.5–4.5)
Glucose: 87 mg/dL (ref 65–99)
Potassium: 5.3 mmol/L — ABNORMAL HIGH (ref 3.5–5.2)
Total Protein: 6.7 g/dL (ref 6.0–8.5)
eGFR: 84 mL/min/{1.73_m2} (ref >59)

## 2021-02-06 LAB — CBC WITH DIFFERENTIAL/PLATELET
Basophils Absolute: 0 10*3/uL (ref 0.0–0.2)
Basos: 0 %
EOS (ABSOLUTE): 0.1 10*3/uL (ref 0.0–0.4)
Eos: 1 %
Hematocrit: 39.6 % (ref 34.0–46.6)
Hemoglobin: 13 g/dL (ref 11.1–15.9)
Immature Grans (Abs): 0.1 10*3/uL (ref 0.0–0.1)
Immature Granulocytes: 1 %
Lymphocytes Absolute: 2.1 10*3/uL (ref 0.7–3.1)
Lymphs: 29 %
MCH: 28.7 pg (ref 26.6–33.0)
MCV: 87 fL (ref 79–97)
Monocytes Absolute: 0.5 10*3/uL (ref 0.1–0.9)
Monocytes: 7 %
Neutrophils Absolute: 4.6 10*3/uL (ref 1.4–7.0)
Neutrophils: 62 %
Platelets: 344 10*3/uL (ref 150–450)
RBC: 4.53 x10E6/uL (ref 3.77–5.28)
RDW: 13.1 % (ref 11.7–15.4)
WBC: 7.3 10*3/uL (ref 3.4–10.8)

## 2021-02-06 LAB — LIPID PANEL
Chol/HDL Ratio: 2.7 ratio (ref 0.0–4.4)
Cholesterol, Total: 203 mg/dL — ABNORMAL HIGH (ref 100–199)
HDL: 75 mg/dL (ref >39)
LDL Chol Calc (NIH): 118 mg/dL — ABNORMAL HIGH (ref 0–99)
Triglycerides: 54 mg/dL (ref 0–149)
VLDL Cholesterol Cal: 10 mg/dL (ref 5–40)

## 2021-02-06 LAB — HEPATITIS C ANTIBODY: Hep C Virus Ab: <0.1 s/co ratio (ref 0.0–0.9)

## 2021-03-25 ENCOUNTER — Encounter: Payer: Self-pay | Admitting: Family Medicine

## 2021-05-20 ENCOUNTER — Other Ambulatory Visit: Payer: Self-pay | Admitting: Orthopedic Surgery

## 2022-01-15 ENCOUNTER — Other Ambulatory Visit: Payer: Self-pay | Admitting: Internal Medicine

## 2022-01-15 DIAGNOSIS — R918 Other nonspecific abnormal finding of lung field: Secondary | ICD-10-CM

## 2022-02-03 ENCOUNTER — Ambulatory Visit
Admission: RE | Admit: 2022-02-03 | Discharge: 2022-02-03 | Disposition: A | Discharge status: Home / Self Care | Payer: Medicare Other | Source: Ambulatory Visit | Attending: Internal Medicine | Admitting: Internal Medicine

## 2022-02-03 DIAGNOSIS — R918 Other nonspecific abnormal finding of lung field: Secondary | ICD-10-CM

## 2022-02-05 ENCOUNTER — Telehealth: Payer: Self-pay | Admitting: Physician Assistant

## 2022-02-05 NOTE — Telephone Encounter (Signed)
I spoke with patient to schedule her AWV.  She stated she is no longer a patient @ [facility]. ? ? ?

## 2022-02-06 ENCOUNTER — Ambulatory Visit: Payer: Medicare Other | Admitting: Family Medicine

## 2022-02-07 ENCOUNTER — Telehealth: Payer: Self-pay | Admitting: Internal Medicine

## 2022-02-07 NOTE — Progress Notes (Signed)
Dr Melvyn Novas, Icard's next appt not until 02/27/22. Could we put her in with [place]?

## 2022-02-07 NOTE — Telephone Encounter (Signed)
Spoke with the pt and scheduled appt with BI for 02/27/22 at 2 pm- address given. ?

## 2022-02-07 NOTE — Progress Notes (Signed)
Called pt and there was no answer-LMTCB °

## 2022-02-21 NOTE — Progress Notes (Signed)
Appt with BI for 02/27/22

## 2022-02-27 ENCOUNTER — Ambulatory Visit (INDEPENDENT_AMBULATORY_CARE_PROVIDER_SITE_OTHER): Payer: Medicare Other | Admitting: Pulmonary Disease

## 2022-02-27 ENCOUNTER — Encounter: Payer: Self-pay | Admitting: Pulmonary Disease

## 2022-02-27 VITALS — BP 132/62 | HR 66 | Temp 98.2°F | Ht <= 58 in | Wt 142.6 lb

## 2022-02-27 DIAGNOSIS — Z789 Other specified health status: Secondary | ICD-10-CM | POA: Diagnosis not present

## 2022-02-27 DIAGNOSIS — R911 Solitary pulmonary nodule: Secondary | ICD-10-CM

## 2022-02-27 DIAGNOSIS — R918 Other nonspecific abnormal finding of lung field: Secondary | ICD-10-CM | POA: Diagnosis not present

## 2022-02-27 NOTE — Patient Instructions (Addendum)
Thank you for visiting Dr. Valeta Harms at [facility]. Today we recommend the following:  Orders Placed This Encounter  Procedures   NM PET Image Initial (PI) Skull Base To Thigh (F-18 FDG)   Ambulatory referral to Cardiothoracic Surgery   Pulmonary Function Test   Expect a call from Dr. Abran Duke office  Return in about 3 months (around 05/30/2022) for with Eric Form, NP, or Dr. Valeta Harms.    Please do your part to reduce the spread of COVID-19.

## 2022-02-27 NOTE — Progress Notes (Signed)
Synopsis: Referred in June 2023 for Lung nodule by No ref. provider found  Subjective:   PATIENT ID: Erica Greene GENDER: female DOB: 02-04-1946, MRN: [id number]  Chief Complaint  Patient presents with   Consult    Patient is here to talk about lung nodule.     This is a 76 year old female, past medical history of hypertension, lifelong non-smoker.  Paternal uncle with lung cancer, smoker mother with breast and uterine cancer.  Patient has a right upper lobe groundglass subsolid lesion that has slowly been enlarging.  Initially discovered in April 2022.  Repeat CT scan of the chest in 1 year documents that the lesion has grown minimally in size.  Slowly been getting bigger since 2019.  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  Patient referred to talk today about potential for bronchoscopy or even consideration for surgical evaluation and removal.   Past Medical History:  Diagnosis Date   10 year risk of MI or stroke 7.5% or greater 08/17/2020   16.2%   Family history of breast cancer    Family history of uterine cancer    Hypertension      Family History  Problem Relation Age of Onset   Breast cancer Mother 106   Uterine cancer Mother 13   Cancer Father        LUNG AND THROAT - SMOKER   Cancer Paternal Uncle        LUNG    Cancer Paternal Uncle        LUNG   Cancer Paternal Uncle        LUNG   Cancer Paternal Uncle        LUNG   Kidney failure Brother    Cervical cancer Maternal Aunt        dx in her 4s   Congestive Heart Failure Maternal Aunt 80   Uterine cancer Paternal Aunt        dx >50   Kidney failure Maternal Grandfather    Cancer Paternal Grandmother        NOS   Cancer Paternal Grandfather        lung and throat - smoker   Colon cancer Neg Hx    Colon polyps Neg Hx      Past Surgical History:  Procedure Laterality Date   ABDOMINAL HYSTERECTOMY  1997   TOTAL ABDOMINAL HYSTERECTOMY   BREAST SURGERY     BREAST REDUCTION   COLONOSCOPY      11 yrs ago in [place]- was Normal per pt.   cyst removal foot     CYST REMOVAL HAND     WISDOM TOOTH EXTRACTION      Social History   Socioeconomic History   Marital status: Married    Spouse name: [name]   Number of children: 2   Years of education: Not on file   Highest education level: Not on file  Occupational History   Not on file  Tobacco Use   Smoking status: Never   Smokeless tobacco: Never  Substance and Sexual Activity   Alcohol use: Yes    Alcohol/week: 0.0 standard drinks    Comment: OCC   Drug use: No   Sexual activity: Yes  Other Topics Concern   Not on file  Social History Narrative   Not on file   Social Determinants of Health   Financial Resource Strain: Not on file  Food Insecurity: Not on file  Transportation Needs: Not on file  Physical Activity: Not on file  Stress: Not on file  Social Connections: Not on file  Intimate Partner Violence: Not on file     Allergies  Allergen Reactions   Penicillins Anaphylaxis   Sulfa Antibiotics Itching   Latex Rash     Outpatient Medications Prior to Visit  Medication Sig Dispense Refill   diclofenac sodium (VOLTAREN) 1 % GEL Apply 4 g topically as directed.     Famotidine (PEPCID PO) Take 20 mg by mouth.     hydrochlorothiazide (HYDRODIURIL) 12.5 MG tablet Take 12.5 mg by mouth daily.     metoprolol succinate (TOPROL-XL) 25 MG 24 hr tablet Take 25 mg by mouth daily.     Turmeric 400 MG CAPS Take 1 capsule by mouth daily.     chlorpheniramine (CHLOR-TRIMETON) 4 MG tablet Take 4 mg by mouth 2 (two) times daily as needed for allergies. (Patient not taking: Reported on 02/27/2022)     Cholecalciferol ([employer] VIT D 5000 HIGH-POTENCY PO) Take 1 capsule by mouth daily. (Patient not taking: Reported on 02/27/2022)     No facility-administered medications prior to visit.    Review of Systems  Constitutional:  Negative for chills, fever, malaise/fatigue and weight loss.  HENT:  Negative for hearing loss, sore  throat and tinnitus.   Eyes:  Negative for blurred vision and double vision.  Respiratory:  Negative for cough, hemoptysis, sputum production, shortness of breath, wheezing and stridor.   Cardiovascular:  Negative for chest pain, palpitations, orthopnea, leg swelling and PND.  Gastrointestinal:  Negative for abdominal pain, constipation, diarrhea, heartburn, nausea and vomiting.  Genitourinary:  Negative for dysuria, hematuria and urgency.  Musculoskeletal:  Negative for joint pain and myalgias.  Skin:  Negative for itching and rash.  Neurological:  Negative for dizziness, tingling, weakness and headaches.  Endo/Heme/Allergies:  Negative for environmental allergies. Does not bruise/bleed easily.  Psychiatric/Behavioral:  Negative for depression. The patient is not nervous/anxious and does not have insomnia.   All other systems reviewed and are negative.   Objective:  Physical Exam Vitals reviewed.  Constitutional:      General: She is not in acute distress.    Appearance: She is well-developed.  HENT:     Head: Normocephalic and atraumatic.  Eyes:     General: No scleral icterus.    Conjunctiva/sclera: Conjunctivae normal.     Pupils: Pupils are equal, round, and reactive to light.  Neck:     Vascular: No JVD.     Trachea: No tracheal deviation.  Cardiovascular:     Rate and Rhythm: Normal rate and regular rhythm.     Heart sounds: Normal heart sounds. No murmur heard. Pulmonary:     Effort: Pulmonary effort is normal. No tachypnea, accessory muscle usage or respiratory distress.     Breath sounds: No stridor. No wheezing, rhonchi or rales.  Abdominal:     General: There is no distension.     Palpations: Abdomen is soft.     Tenderness: There is no abdominal tenderness.  Musculoskeletal:        General: No tenderness.     Cervical back: Neck supple.  Lymphadenopathy:     Cervical: No cervical adenopathy.  Skin:    General: Skin is warm and dry.     Capillary Refill:  Capillary refill takes less than 2 seconds.     Findings: No rash.  Neurological:     Mental Status: She is alert and oriented to person, place, and time.  Psychiatric:  Behavior: Behavior normal.     Vitals:   02/27/22 1406  BP: 132/62  Pulse: 66  Temp: 98.2 F (36.8 C)  TempSrc: Oral  SpO2: 99%  Weight: 142 lb 9.6 oz (64.7 kg)  Height: 4\' 9"  (1.448 m)   99% on RA BMI Readings from Last 3 Encounters:  02/27/22 30.86 kg/m  02/05/21 29.89 kg/m  08/16/20 29.64 kg/m   Wt Readings from Last 3 Encounters:  02/27/22 142 lb 9.6 oz (64.7 kg)  02/05/21 141 lb 12.8 oz (64.3 kg)  08/16/20 141 lb 12.8 oz (64.3 kg)     CBC    Component Value Date/Time   WBC 7.3 02/05/2021 0958   WBC 7.5 06/04/2018 1015   RBC 4.53 02/05/2021 0958   RBC 4.60 06/04/2018 1015   HGB 13.0 02/05/2021 0958   HCT 39.6 02/05/2021 0958   PLT 344 02/05/2021 0958   MCV 87 02/05/2021 0958   MCH 28.7 02/05/2021 0958   MCHC 32.8 02/05/2021 0958   MCHC 33.6 06/04/2018 1015   RDW 13.1 02/05/2021 0958   LYMPHSABS 2.1 02/05/2021 0958   MONOABS 0.5 06/04/2018 1015   EOSABS 0.1 02/05/2021 0958   BASOSABS 0.0 02/05/2021 0958     Chest Imaging: 02/03/2022 super D CT chest without contrast: Right upper lobe subsolid groundglass lesion concerning for malignancy. Has slowly been enlarging. The patient's images have been independently reviewed by me.    Pulmonary Functions Testing Results:     View : No data to display.          FeNO:   Pathology:   Echocardiogram:   Heart Catheterization:     Assessment & Plan:     ICD-10-CM   1. Lung nodule  R91.1 Ambulatory referral to Cardiothoracic Surgery    Pulmonary Function Test    NM PET Image Initial (PI) Skull Base To Thigh (F-18 FDG)    2. Ground glass opacity present on imaging of lung  R91.8 Ambulatory referral to Cardiothoracic Surgery    Pulmonary Function Test    NM PET Image Initial (PI) Skull Base To Thigh (F-18 FDG)    3.  Non-smoker  Z78.9       Discussion:  This is a 76 year old female, non-smoker, right upper lobe groundglass opacity that has slowly enlarged over time.  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  Plan: Today in the office we talked about the risk benefits and alternatives of consideration for robotic assisted navigational bronchoscopy with tissue sampling or referral to cardiothoracic surgery for consideration of removal. I think that she would likely be a good candidate for single anesthetic event. We will have a nuclear medicine PET scan complete as well as PFTs complete. We will get an appointment to see Dr. Kipp Brood at cardiothoracic surgery office. Patient is agreeable to this plan. Ultimately if she decided like tissue biopsy we can always bring her back for robotic assisted navigational bronchoscopy tissue sampling. With top of the risk benefits and alternatives of that today in the office as well.  Orders placed. Return to clinic in 3 months hopefully after all of her procedures are complete.   Current Outpatient Medications:    diclofenac sodium (VOLTAREN) 1 % GEL, Apply 4 g topically as directed., Disp: , Rfl:    Famotidine (PEPCID PO), Take 20 mg by mouth., Disp: , Rfl:    hydrochlorothiazide (HYDRODIURIL) 12.5 MG tablet, Take 12.5 mg by mouth daily., Disp: , Rfl:    metoprolol succinate (TOPROL-XL) 25 MG 24 hr tablet, Take  25 mg by mouth daily., Disp: , Rfl:    Turmeric 400 MG CAPS, Take 1 capsule by mouth daily., Disp: , Rfl:    chlorpheniramine (CHLOR-TRIMETON) 4 MG tablet, Take 4 mg by mouth 2 (two) times daily as needed for allergies. (Patient not taking: Reported on 02/27/2022), Disp: , Rfl:    Cholecalciferol ([employer] VIT D 5000 HIGH-POTENCY PO), Take 1 capsule by mouth daily. (Patient not taking: Reported on 02/27/2022), Disp: , Rfl:   I spent 63 minutes dedicated to the care of this patient on the date of this encounter to include pre-visit review of records,  face-to-face time with the patient discussing conditions above, post visit ordering of testing, clinical documentation with the electronic health record, making appropriate referrals as documented, and communicating necessary findings to members of the patients care team.   Garner Nash, DO [facility] 02/27/2022 2:32 PM

## 2022-03-03 ENCOUNTER — Ambulatory Visit (HOSPITAL_COMMUNITY): Payer: Medicare Other

## 2022-03-05 ENCOUNTER — Ambulatory Visit (HOSPITAL_COMMUNITY)
Admission: RE | Admit: 2022-03-05 | Discharge: 2022-03-05 | Disposition: A | Discharge status: Home / Self Care | Payer: Medicare Other | Source: Ambulatory Visit | Attending: Pulmonary Disease | Admitting: Pulmonary Disease

## 2022-03-05 DIAGNOSIS — R918 Other nonspecific abnormal finding of lung field: Secondary | ICD-10-CM | POA: Diagnosis present

## 2022-03-05 DIAGNOSIS — R911 Solitary pulmonary nodule: Secondary | ICD-10-CM | POA: Insufficient documentation

## 2022-03-05 LAB — GLUCOSE, CAPILLARY: Glucose-Capillary: 94 mg/dL (ref 70–99)

## 2022-03-05 MED ORDER — FLUDEOXYGLUCOSE F - 18 (FDG) INJECTION
7.0000 | Freq: Once | INTRAVENOUS | Status: AC | PRN
Start: 2022-03-05 — End: 2022-03-05
  Administered 2022-03-05: 7 via INTRAVENOUS

## 2022-03-07 ENCOUNTER — Ambulatory Visit (INDEPENDENT_AMBULATORY_CARE_PROVIDER_SITE_OTHER): Payer: Medicare Other | Admitting: Pulmonary Disease

## 2022-03-07 DIAGNOSIS — R911 Solitary pulmonary nodule: Secondary | ICD-10-CM

## 2022-03-07 DIAGNOSIS — R918 Other nonspecific abnormal finding of lung field: Secondary | ICD-10-CM

## 2022-03-07 LAB — PULMONARY FUNCTION TEST
DL/VA % pred: 147 %
DL/VA: 6.33 ml/min/mmHg/L
DLCO unc % pred: 119 %
DLCO unc: 18.45 ml/min/mmHg
FEF 25-75 Post: 2.49 L/sec
FEF 25-75 Pre: 2.19 L/sec
FEF2575-%Change-Post: 13 %
FEF2575-%Pred-Post: 194 %
FEF2575-%Pred-Pre: 170 %
FEV1-%Change-Post: 6 %
FEV1-%Pred-Post: 92 %
FEV1-%Pred-Pre: 87 %
FEV1-Post: 1.43 L
FEV1-Pre: 1.34 L
FEV1FVC-%Change-Post: 2 %
FEV1FVC-%Pred-Pre: 120 %
FEV6-%Change-Post: 0 %
FEV6-%Pred-Post: 77 %
FEV6-%Pred-Pre: 76 %
FEV6-Post: 1.51 L
FEV6-Pre: 1.5 L
FEV6FVC-%Pred-Post: 106 %
FEV6FVC-%Pred-Pre: 106 %
FVC-%Change-Post: 3 %
FVC-%Pred-Post: 74 %
FVC-%Pred-Pre: 72 %
FVC-Post: 1.55 L
FVC-Pre: 1.5 L
Post FEV1/FVC ratio: 92 %
Post FEV6/FVC ratio: 100 %
Pre FEV1/FVC ratio: 90 %
Pre FEV6/FVC Ratio: 100 %
RV % pred: 82 %
RV: 1.65 L
TLC % pred: 85 %
TLC: 3.53 L

## 2022-03-07 NOTE — Progress Notes (Signed)
Full PFT performed today. °

## 2022-03-07 NOTE — Patient Instructions (Signed)
Full PFT performed today. °

## 2022-03-21 ENCOUNTER — Institutional Professional Consult (permissible substitution) (INDEPENDENT_AMBULATORY_CARE_PROVIDER_SITE_OTHER): Payer: Medicare Other | Admitting: Thoracic Surgery (Cardiothoracic Vascular Surgery)

## 2022-03-21 VITALS — BP 172/79 | HR 74 | Ht <= 58 in | Wt 142.0 lb

## 2022-03-21 DIAGNOSIS — R911 Solitary pulmonary nodule: Secondary | ICD-10-CM | POA: Diagnosis not present

## 2022-03-21 NOTE — H&P (View-Only) (Signed)
[address]       [name] [address]             [phone]                    Erica Greene [facility] Medical Record #[id number] Date of Birth: 07/03/1946  Referring: Garner Nash, DO Primary Care: Cipriano Mile, NP Primary Cardiologist: None  Chief Complaint:    Chief Complaint  Patient presents with   Lung Lesion    Initial surgical consult, PET 6/5, PFT 6/9, CT 5/8    History of Present Illness:    Erica Greene 76 y.o. female presents for surgical evaluation of a right upper lobe pulmonary nodule.  This was found several years ago when she was being worked up for chronic cough.  Over the last several scans there has been some change in the size and consistency of the nodule.  She denies any shortness of breath.  Her weight has been stable and she denies any neurologic symptoms.    Smoking Hx: Lifelong non-smoker but has had significant secondhand smoke exposure.   Zubrod Score: At the time of surgery this patient's most appropriate activity status/level should be described as: [x]     0    Normal activity, no symptoms []     1    Restricted in physical strenuous activity but ambulatory, able to do out light work []     2    Ambulatory and capable of self care, unable to do work activities, up and about               >50 % of waking hours                              []     3    Only limited self care, in bed greater than 50% of waking hours []     4    Completely disabled, no self care, confined to bed or chair []     5    Moribund   Past Medical History:  Diagnosis Date   10 year risk of MI or stroke 7.5% or greater 08/17/2020   16.2%   Family history of breast cancer    Family history of uterine cancer    Hypertension     Past Surgical History:  Procedure Laterality Date   ABDOMINAL HYSTERECTOMY  1997   TOTAL ABDOMINAL HYSTERECTOMY   BREAST SURGERY     BREAST REDUCTION   COLONOSCOPY     11 yrs ago in [place]- was Normal per pt.    cyst removal foot     CYST REMOVAL HAND     WISDOM TOOTH EXTRACTION      Family History  Problem Relation Age of Onset   Breast cancer Mother 37   Uterine cancer Mother 23   Cancer Father        LUNG AND THROAT - SMOKER   Cancer Paternal Uncle        LUNG    Cancer Paternal Uncle        LUNG   Cancer Paternal Uncle        LUNG   Cancer Paternal Uncle        LUNG   Kidney failure Brother    Cervical cancer Maternal Aunt        dx in her 8s   Congestive Heart Failure Maternal Aunt  19   Uterine cancer Paternal Aunt        dx >50   Kidney failure Maternal Grandfather    Cancer Paternal Grandmother        NOS   Cancer Paternal Grandfather        lung and throat - smoker   Colon cancer Neg Hx    Colon polyps Neg Hx      Social History   Tobacco Use  Smoking Status Never  Smokeless Tobacco Never    Social History   Substance and Sexual Activity  Alcohol Use Yes   Alcohol/week: 0.0 standard drinks of alcohol   Comment: OCC     Allergies  Allergen Reactions   Penicillins Anaphylaxis   Amlodipine Swelling    Gums swell   Lisinopril Swelling    Facial swelling   Sulfa Antibiotics Itching   Latex Rash    Current Outpatient Medications  Medication Sig Dispense Refill   chlorpheniramine (CHLOR-TRIMETON) 4 MG tablet Take 4 mg by mouth 2 (two) times daily as needed for allergies.     Cholecalciferol ([employer] VIT D 5000 HIGH-POTENCY PO) Take 1 capsule by mouth daily.     diclofenac sodium (VOLTAREN) 1 % GEL Apply 4 g topically as directed.     hydrochlorothiazide (HYDRODIURIL) 12.5 MG tablet Take 12.5 mg by mouth daily.     metoprolol succinate (TOPROL-XL) 25 MG 24 hr tablet Take 25 mg by mouth daily.     Turmeric 400 MG CAPS Take 1 capsule by mouth daily.     No current facility-administered medications for this visit.    Review of Systems  Constitutional: Negative.   Respiratory: Negative.    Cardiovascular: Negative.   Neurological: Negative.       PHYSICAL EXAMINATION: BP (!) 172/79 (BP Location: Right Arm, Patient Position: Sitting)   Pulse 74   Ht 4\' 9"  (1.448 m)   Wt 142 lb (64.4 kg)   SpO2 94% Comment: RA  BMI 30.73 kg/m  Physical Exam Constitutional:      General: She is not in acute distress.    Appearance: Normal appearance. She is normal weight. She is not ill-appearing.  Eyes:     Extraocular Movements: Extraocular movements intact.  Cardiovascular:     Rate and Rhythm: Normal rate and regular rhythm.     Heart sounds: No murmur heard. Pulmonary:     Effort: Pulmonary effort is normal. No respiratory distress.     Breath sounds: Normal breath sounds.  Musculoskeletal:        General: Normal range of motion.     Cervical back: Normal range of motion.  Skin:    General: Skin is warm and dry.  Neurological:     General: No focal deficit present.     Mental Status: She is alert and oriented to person, place, and time.     Diagnostic Studies & Laboratory data:     Recent Radiology Findings:   NM PET Image Initial (PI) Skull Base To Thigh (F-18 FDG)  Result Date: 03/06/2022 CLINICAL DATA:  Initial treatment strategy for right upper lobe pulmonary nodule. EXAM: NUCLEAR MEDICINE PET SKULL BASE TO THIGH TECHNIQUE: 7.1 mCi F-18 FDG was injected intravenously. Full-ring PET imaging was performed from the skull base to thigh after the radiotracer. CT data was obtained and used for attenuation correction and anatomic localization. Fasting blood glucose: 94 mg/dl COMPARISON:  Multiple priors including most recent chest CT Feb 03, 2022 and PET-CT June 25, 2018 FINDINGS: Mediastinal  blood pool activity: SUV max 1.9 Liver activity: SUV max NA NECK: No hypermetabolic cervical adenopathy. Incidental CT findings: none CHEST: Right upper lobe pulmonary nodule measures 1.3 x 1.0 cm on image 18/7 with a max SUV of 1.3. No hypermetabolic thoracic lymph nodes. Incidental CT findings: Aortic atherosclerosis. ABDOMEN/PELVIS: No  abnormal hypermetabolic activity within the liver, pancreas, adrenal glands, or spleen. No hypermetabolic lymph nodes in the abdomen or pelvis. 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 SKELETON: No focal hypermetabolic activity to suggest skeletal metastasis. Incidental CT findings: Multilevel degenerative changes spine with mild multifocal degenerative joint disease. IMPRESSION: 1. No significant abnormal hypermetabolic activity in the 1.3 cm right upper lobe pulmonary nodule, possibly reflecting a benign nodule. 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 Consider continued surveillance or direct tissue sampling. 2. No pathologic hypermetabolic activity in the neck, chest, abdomen or pelvis. 3. Aortic Atherosclerosis (ICD10-I70.0). Electronically Signed   By: Dahlia Bailiff M.D.   On: 03/06/2022 10:45       I have independently reviewed the above radiology studies  and reviewed the findings with the patient.   Recent Lab Findings: Lab Results  Component Value Date   WBC 7.3 02/05/2021   HGB 13.0 02/05/2021   HCT 39.6 02/05/2021   PLT 344 02/05/2021   GLUCOSE 87 02/05/2021   CHOL 203 (H) 02/05/2021   TRIG 54 02/05/2021   HDL 75 02/05/2021   LDLCALC 118 (H) 02/05/2021   ALT 13 02/05/2021   AST 21 02/05/2021   NA 143 02/05/2021   K 5.3 (H) 02/05/2021   CL 104 02/05/2021   CREATININE 0.74 02/05/2021   BUN 15 02/05/2021   CO2 22 02/05/2021   TSH 1.00 08/03/2018     PFTs:  - FVC: 72% - FEV1: 87% -DLCO: 119%  Problem List: 1.3 cm right upper lobe pulmonary nodule with SUV of 1.3  Assessment / Plan:   76 year old female with a 1.3 cm right upper lobe pulmonary nodule with minimal uptake on PET/CT.  There is concern that this may be a low-grade primary  lung cancer.  Given its location within the lung a direct wedge resection will be challenging without removing a significant portion.  We discussed the risk and benefits of a combined procedure with a robotic navigational bronchoscopy and biopsy followed by a robotic assisted thoracoscopy with right upper lobectomy.  She is agreeable to proceed.  We will coordinate our schedules for single anesthetic event.     I  spent 40 minutes with  the patient face to face in counseling and coordination of care.    Lajuana Matte 03/21/2022 4:04 PM

## 2022-03-26 ENCOUNTER — Other Ambulatory Visit: Payer: Self-pay | Admitting: *Deleted

## 2022-03-26 ENCOUNTER — Encounter: Payer: Self-pay | Admitting: *Deleted

## 2022-03-26 DIAGNOSIS — Z5181 Encounter for therapeutic drug level monitoring: Secondary | ICD-10-CM

## 2022-03-26 DIAGNOSIS — R911 Solitary pulmonary nodule: Secondary | ICD-10-CM

## 2022-03-29 DIAGNOSIS — C3490 Malignant neoplasm of unspecified part of unspecified bronchus or lung: Secondary | ICD-10-CM

## 2022-03-29 HISTORY — DX: Malignant neoplasm of unspecified part of unspecified bronchus or lung: C34.90

## 2022-04-03 ENCOUNTER — Other Ambulatory Visit (HOSPITAL_COMMUNITY): Payer: Medicare Other

## 2022-04-03 NOTE — Pre-Procedure Instructions (Signed)
Erica Greene  04/03/2022     Surgical Instructions   Your procedure is scheduled on Tues., April 08, 2022 from 11:37AM-3:07PM.  Report to [facility] Main Entrance "A" at 9:35 A.M., then check in with the Admitting office.  Call this number if you have problems the morning of surgery:  [phone]   Remember:  Do not eat or drink after midnight on July 10th    Take these medicines the morning of surgery with A SIP OF WATER: Metoprolol succinate (TOPROL-XL)  As of today, STOP taking any Aspirin (unless otherwise instructed by your surgeon) Aleve, Naproxen, Ibuprofen, Motrin, Advil, Goody's, BC's, all herbal medications, fish oil, and all vitamins.             Day of Surgery: Do not wear jewelry or makeup. Do not wear lotions, powders, perfumes/colognes, or deodorant. Do not shave 48 hours prior to surgery.   Do not bring valuables to the hospital. DO Not wear nail polish, gel polish, artificial nails, or any other type of covering on natural nails (fingers and toes) If you have artificial nails or gel coating that need to be removed by a nail salon, please have this removed prior to surgery. Artificial nails or gel coating may interfere with anesthesia's ability to adequately monitor your vital signs.             [facility] is not responsible for any belongings or valuables.  Do NOT Smoke (Tobacco/Vaping)  24 hours prior to your procedure  If you use a CPAP at night, you may bring your mask and machine for your overnight stay.   Contacts, glasses, hearing aids, dentures or partials may not be worn into surgery, please bring cases for these belongings   For patients admitted to the hospital, discharge time will be determined by your treatment team.   Patients discharged the day of surgery will not be allowed to drive home, and someone needs to stay with them for 24 hours.  Special instructions:    Oral Hygiene is also important to reduce your risk of infection.  Remember -  BRUSH YOUR TEETH THE MORNING OF SURGERY WITH YOUR REGULAR TOOTHPASTE  [facility]- Preparing For Surgery  Before surgery, you can play an important role. Because skin is not sterile, your skin needs to be as free of germs as possible. You can reduce the number of germs on your skin by washing with CHG (chlorahexidine gluconate) Soap before surgery.  CHG is an antiseptic cleaner which kills germs and bonds with the skin to continue killing germs even after washing.    Please do not use if you have an allergy to CHG or antibacterial soaps. If your skin becomes reddened/irritated stop using the CHG.  Do not shave (including legs and underarms) for at least 48 hours prior to first CHG shower. It is OK to shave your face.  Please follow these instructions carefully.    Shower the NIGHT BEFORE SURGERY and the MORNING OF SURGERY with CHG Soap.   If you chose to wash your hair, wash your hair first as usual with your normal shampoo. After you shampoo, rinse your hair and body thoroughly to remove the shampoo.  Then [employer] and genitals (private parts) with your normal soap and rinse thoroughly to remove soap.  After that Use CHG Soap as you would any other liquid soap. You can apply CHG directly to the skin and wash gently with a scrungie or a clean washcloth.  Apply the CHG Soap to your body ONLY FROM THE NECK DOWN.  Do not use on open wounds or open sores. Avoid contact with your eyes, ears, mouth and genitals (private parts). Wash Face and genitals (private parts)  with your normal soap.   Wash thoroughly, paying special attention to the area where your surgery will be performed.  Thoroughly rinse your body with warm water from the neck down.  DO NOT shower/wash with your normal soap after using and rinsing off the CHG Soap.  Pat yourself dry with a CLEAN TOWEL.  Wear CLEAN PAJAMAS to bed the night before surgery  Place CLEAN SHEETS on your bed the night before your surgery  DO NOT SLEEP  WITH PETS.  Reminder: Take a shower with CHG soap. Wear Clean/Comfortable clothing the morning of surgery Do not apply any deodorants/lotions.   Remember to brush your teeth WITH YOUR REGULAR TOOTHPASTE.  If you received a COVID test during your pre-op visit  it is requested that you wear a mask when out in public, stay away from anyone that may not be feeling well and notify your surgeon if you develop symptoms. If you have been in contact with anyone that has tested positive in the last 10 days please notify you surgeon.  Notify your provider:  if you develop a fever of 100.4 or greater, sneezing, cough, sore throat, shortness of breath or body aches.  NO VISITORS WILL BE ALLOWED IN PRE-OP WHERE PATIENTS ARE PREPPED FOR SURGERY.    SURGICAL WAITING ROOM VISITATION Patients having surgery or a procedure in a hospital may have two support people. Children under the age of 47 must have an adult with them who is not the patient. They may stay in the waiting area during the procedure and may switch out with other visitors. If the patient needs to stay at the hospital during part of their recovery, the visitor guidelines for inpatient rooms apply.  Please refer to the [facility] website for the visitor guidelines for Inpatients (after your surgery is over and you are in a regular room).   Please read over the following fact sheets that you were given.

## 2022-04-04 ENCOUNTER — Other Ambulatory Visit: Payer: Self-pay

## 2022-04-04 ENCOUNTER — Encounter (HOSPITAL_COMMUNITY)
Admission: RE | Admit: 2022-04-04 | Discharge: 2022-04-04 | Disposition: A | Discharge status: Home / Self Care | Payer: Medicare Other | Source: Ambulatory Visit | Attending: Thoracic Surgery (Cardiothoracic Vascular Surgery) | Admitting: Thoracic Surgery (Cardiothoracic Vascular Surgery)

## 2022-04-04 ENCOUNTER — Ambulatory Visit (HOSPITAL_COMMUNITY)
Admission: RE | Admit: 2022-04-04 | Discharge: 2022-04-04 | Disposition: A | Discharge status: Home / Self Care | Payer: Medicare Other | Source: Ambulatory Visit | Attending: Thoracic Surgery (Cardiothoracic Vascular Surgery) | Admitting: Thoracic Surgery (Cardiothoracic Vascular Surgery)

## 2022-04-04 ENCOUNTER — Encounter (HOSPITAL_COMMUNITY): Payer: Self-pay

## 2022-04-04 VITALS — BP 154/75 | HR 64 | Temp 98.2°F | Resp 17 | Ht <= 58 in | Wt 141.6 lb

## 2022-04-04 DIAGNOSIS — I10 Essential (primary) hypertension: Secondary | ICD-10-CM | POA: Insufficient documentation

## 2022-04-04 DIAGNOSIS — R911 Solitary pulmonary nodule: Secondary | ICD-10-CM | POA: Insufficient documentation

## 2022-04-04 DIAGNOSIS — Z20822 Contact with and (suspected) exposure to covid-19: Secondary | ICD-10-CM | POA: Diagnosis not present

## 2022-04-04 DIAGNOSIS — Z5181 Encounter for therapeutic drug level monitoring: Secondary | ICD-10-CM | POA: Diagnosis not present

## 2022-04-04 DIAGNOSIS — Z01818 Encounter for other preprocedural examination: Secondary | ICD-10-CM | POA: Insufficient documentation

## 2022-04-04 DIAGNOSIS — I700 Atherosclerosis of aorta: Secondary | ICD-10-CM | POA: Insufficient documentation

## 2022-04-04 HISTORY — DX: Malignant (primary) neoplasm, unspecified: C80.1

## 2022-04-04 HISTORY — DX: Anemia, unspecified: D64.9

## 2022-04-04 HISTORY — DX: Pneumonia, unspecified organism: J18.9

## 2022-04-04 LAB — CBC
HCT: 41.8 % (ref 36.0–46.0)
Hemoglobin: 13.5 g/dL (ref 12.0–15.0)
MCH: 28.3 pg (ref 26.0–34.0)
MCHC: 32.3 g/dL (ref 30.0–36.0)
MCV: 87.6 fL (ref 80.0–100.0)
Platelets: 351 10*3/uL (ref 150–400)
RBC: 4.77 MIL/uL (ref 3.87–5.11)
RDW: 13.6 % (ref 11.5–15.5)
WBC: 8.8 10*3/uL (ref 4.0–10.5)
nRBC: 0 % (ref 0.0–0.2)

## 2022-04-04 LAB — COMPREHENSIVE METABOLIC PANEL
ALT: 17 U/L (ref 0–44)
AST: 20 U/L (ref 15–41)
Albumin: 4.2 g/dL (ref 3.5–5.0)
Alkaline Phosphatase: 50 U/L (ref 38–126)
Anion gap: 13 (ref 5–15)
BUN: 15 mg/dL (ref 8–23)
CO2: 28 mmol/L (ref 22–32)
Calcium: 9.9 mg/dL (ref 8.9–10.3)
Chloride: 98 mmol/L (ref 98–111)
Creatinine, Ser: 0.82 mg/dL (ref 0.44–1.00)
GFR, Estimated: >60 mL/min (ref >60)
Glucose, Bld: 92 mg/dL (ref 70–99)
Potassium: 3.3 mmol/L — ABNORMAL LOW (ref 3.5–5.1)
Sodium: 139 mmol/L (ref 135–145)
Total Bilirubin: 0.6 mg/dL (ref 0.3–1.2)
Total Protein: 7.7 g/dL (ref 6.5–8.1)

## 2022-04-04 LAB — TYPE AND SCREEN
ABO/RH(D): O POS
Antibody Screen: NEGATIVE

## 2022-04-04 LAB — APTT: aPTT: 30 seconds (ref 24–36)

## 2022-04-04 LAB — PROTIME-INR
INR: 1 (ref 0.8–1.2)
Prothrombin Time: 13.3 seconds (ref 11.4–15.2)

## 2022-04-04 LAB — URINALYSIS, ROUTINE W REFLEX MICROSCOPIC
Bilirubin Urine: NEGATIVE
Glucose, UA: NEGATIVE mg/dL
Hgb urine dipstick: NEGATIVE
Ketones, ur: NEGATIVE mg/dL
Leukocytes,Ua: NEGATIVE
Nitrite: NEGATIVE
Protein, ur: NEGATIVE mg/dL
Specific Gravity, Urine: 1.008 (ref 1.005–1.030)
pH: 6 (ref 5.0–8.0)

## 2022-04-04 LAB — SURGICAL PCR SCREEN
MRSA, PCR: NEGATIVE
Staphylococcus aureus: NEGATIVE

## 2022-04-04 NOTE — Progress Notes (Signed)
PCP - Cipriano Mile, NP Cardiologist - denies  PPM/ICD - n/a  Chest x-ray - 04/04/22 EKG - 04/04/22 Stress Test - denies ECHO - denies Cardiac Cath - denies  Sleep Study - denies  CPAP - denies  Blood Thinner Instructions: n/a Aspirin Instructions: n/a   NPO at MD  COVID TEST- 04/04/22, done in PAT  Anesthesia review: Yes, EKG review.  Patient denies shortness of breath, fever, cough and chest pain at PAT appointment   All instructions explained to the patient, with a verbal understanding of the material. Patient agrees to go over the instructions while at home for a better understanding. Patient also instructed to self quarantine after being tested for COVID-19. The opportunity to ask questions was provided.

## 2022-04-05 LAB — SARS CORONAVIRUS 2 (TAT 6-24 HRS): SARS Coronavirus 2: NEGATIVE

## 2022-04-07 NOTE — Progress Notes (Signed)
Anesthesia Chart Review:  Case: [id number] Date/Time: 04/08/22 1122   Procedure: XI ROBOTIC ASSISTED THORASCOPY-RIGHT UPPER LOBECTOMY (Right: Chest)   Anesthesia type: General   Pre-op diagnosis: PULMONARY NODULE   Location: [facility] OR ROOM 10 / [facility]   Surgeons: Lajuana Matte, MD       DISCUSSION: Patient is a 76 year old female scheduled for the above procedure.  History includes never smoker (some secondhand exposure), HTN, RUL pulmonary nodule (first noted on 06/10/18 chest CT, minimally enlarged on 02/03/22 CT).   Seen by pulmonology and CT surgery for lung nodule and the above procedure recommended.   Dr. Kipp Brood classified her Zubrod Score as 0: Normal activity, no symptoms.  Preoperative EKG and labs reviewed. For ABG and 2nd T&S specimen on arrival.  Anesthesia team to evaluate on the day of surgery.    VS: BP (!) 154/75   Pulse 64   Temp 36.8 C (Oral)   Resp 17   Ht 4\' 9"  (1.448 m)   Wt 64.2 kg   SpO2 100%   BMI 30.64 kg/m    PROVIDERS: Cipriano Mile, NP is PCP  Christinia Gully, MD & June Leap, DO are pulmonologists   LABS: Labs reviewed: Acceptable for surgery. (all labs ordered are listed, but only abnormal results are displayed)  Labs Reviewed  COMPREHENSIVE METABOLIC PANEL - Abnormal; Notable for the following components:      Result Value   Potassium 3.3 (*)    All other components within normal limits  URINALYSIS, ROUTINE W REFLEX MICROSCOPIC - Abnormal; Notable for the following components:   Color, Urine STRAW (*)    All other components within normal limits  SARS CORONAVIRUS 2 (TAT 6-24 HRS)  SURGICAL PCR SCREEN  CBC  PROTIME-INR  APTT  TYPE AND SCREEN    PFTs 03/07/22: FVC 1.50 (72%), post 1.55 (74%). FEV1 1.34 (87 5), post 1.43 (92%). DLCO unc 18.45 (119%).   IMAGES: CXR 04/04/22: IMPRESSION: Right upper lobe pulmonary nodule is grossly unchanged. No new focal lung infiltrate.   PET Scan 03/05/22: IMPRESSION: 1. No significant  abnormal hypermetabolic activity in the 1.3 cm right upper lobe pulmonary nodule, possibly reflecting a benign nodule. 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 Consider continued surveillance or direct tissue sampling. 2. No pathologic hypermetabolic activity in the neck, chest, abdomen or pelvis. 3. Aortic Atherosclerosis (ICD10-I70.0).   CT Super D Chest 02/03/22: IMPRESSION: 1. Redemonstrated pulmonary nodule of the right upper lobe with a solid-appearing core but with distinctly subsolid margins. This appears to have very minimally enlarged compared to prior examination, and despite overall very indolent behavior on examinations dating back to 2019, remains morphologically suspicious for low-grade adenocarcinoma. 2. No new nodules. - Aortic Atherosclerosis (ICD10-I70.0).     EKG: 04/04/22:  Normal sinus rhythm Minimal voltage criteria for LVH, may be normal variant ( R in aVL ) Borderline ECG No previous ECGs available Confirmed by Larae Grooms [phone]) on 04/05/2022 10:34:10 AM   CV: N/A  Past Medical History:  Diagnosis Date   10 year risk of MI or stroke 7.5% or greater 08/17/2020   16.2%   Anemia    during pregnancies   Cancer ([place])    pre-cancerous skin lesions on face. surgically removed.   Family history of breast cancer    Family history of uterine cancer    Hypertension    Pneumonia    as a child    Past Surgical History:  Procedure Laterality  Date   ABDOMINAL HYSTERECTOMY  1997   TOTAL ABDOMINAL HYSTERECTOMY   BREAST SURGERY     BREAST REDUCTION   COLONOSCOPY     11 yrs ago in [place]- was Normal per pt.   cyst removal foot     CYST REMOVAL HAND     WISDOM TOOTH EXTRACTION      MEDICATIONS:  Cholecalciferol ([employer]S VIT D 5000 HIGH-POTENCY PO)   hydrochlorothiazide (HYDRODIURIL) 12.5 MG tablet   metoprolol succinate (TOPROL-XL) 25 MG 24 hr tablet   Omega-3 Fatty Acids (FISH OIL)  1000 MG CAPS   Probiotic Product (PROBIOTIC DAILY PO)   Turmeric 500 MG CAPS   No current facility-administered medications for this encounter.    Myra Gianotti, PA-C Surgical Short Stay/Anesthesiology [facility] Phone [phone] [facility] Phone [phone] 04/07/2022 9:48 AM

## 2022-04-08 ENCOUNTER — Inpatient Hospital Stay (HOSPITAL_COMMUNITY)
Admission: RE | Admit: 2022-04-08 | Discharge: 2022-04-12 | DRG: 164 | Disposition: A | Discharge status: Hospice | Payer: Medicare Other | Attending: Thoracic Surgery (Cardiothoracic Vascular Surgery) | Admitting: Thoracic Surgery (Cardiothoracic Vascular Surgery)

## 2022-04-08 ENCOUNTER — Inpatient Hospital Stay (HOSPITAL_COMMUNITY): Payer: Medicare Other | Admitting: Anesthesiology

## 2022-04-08 ENCOUNTER — Inpatient Hospital Stay (HOSPITAL_COMMUNITY): Payer: Medicare Other

## 2022-04-08 ENCOUNTER — Encounter (HOSPITAL_COMMUNITY)
Admission: RE | Disposition: A | Discharge status: Hospice | Payer: Self-pay | Source: Home / Self Care | Attending: Thoracic Surgery (Cardiothoracic Vascular Surgery)

## 2022-04-08 ENCOUNTER — Other Ambulatory Visit: Payer: Self-pay

## 2022-04-08 ENCOUNTER — Encounter (HOSPITAL_COMMUNITY): Payer: Self-pay | Admitting: Thoracic Surgery (Cardiothoracic Vascular Surgery)

## 2022-04-08 DIAGNOSIS — R000 Tachycardia, unspecified: Secondary | ICD-10-CM | POA: Diagnosis not present

## 2022-04-08 DIAGNOSIS — I10 Essential (primary) hypertension: Secondary | ICD-10-CM | POA: Diagnosis present

## 2022-04-08 DIAGNOSIS — Z882 Allergy status to sulfonamides status: Secondary | ICD-10-CM | POA: Diagnosis not present

## 2022-04-08 DIAGNOSIS — Z880 Allergy status to penicillin: Secondary | ICD-10-CM

## 2022-04-08 DIAGNOSIS — J45909 Unspecified asthma, uncomplicated: Secondary | ICD-10-CM | POA: Diagnosis present

## 2022-04-08 DIAGNOSIS — R053 Chronic cough: Secondary | ICD-10-CM | POA: Diagnosis present

## 2022-04-08 DIAGNOSIS — Z841 Family history of disorders of kidney and ureter: Secondary | ICD-10-CM | POA: Diagnosis not present

## 2022-04-08 DIAGNOSIS — Z7722 Contact with and (suspected) exposure to environmental tobacco smoke (acute) (chronic): Secondary | ICD-10-CM | POA: Diagnosis present

## 2022-04-08 DIAGNOSIS — R911 Solitary pulmonary nodule: Secondary | ICD-10-CM

## 2022-04-08 DIAGNOSIS — Z79899 Other long term (current) drug therapy: Secondary | ICD-10-CM | POA: Diagnosis not present

## 2022-04-08 DIAGNOSIS — N289 Disorder of kidney and ureter, unspecified: Secondary | ICD-10-CM | POA: Diagnosis present

## 2022-04-08 DIAGNOSIS — Z91040 Latex allergy status: Secondary | ICD-10-CM

## 2022-04-08 DIAGNOSIS — Z803 Family history of malignant neoplasm of breast: Secondary | ICD-10-CM | POA: Diagnosis not present

## 2022-04-08 DIAGNOSIS — Z902 Acquired absence of lung [part of]: Principal | ICD-10-CM

## 2022-04-08 DIAGNOSIS — Z888 Allergy status to other drugs, medicaments and biological substances status: Secondary | ICD-10-CM | POA: Diagnosis not present

## 2022-04-08 DIAGNOSIS — C3411 Malignant neoplasm of upper lobe, right bronchus or lung: Secondary | ICD-10-CM | POA: Diagnosis present

## 2022-04-08 DIAGNOSIS — K920 Hematemesis: Secondary | ICD-10-CM | POA: Diagnosis not present

## 2022-04-08 DIAGNOSIS — Z8249 Family history of ischemic heart disease and other diseases of the circulatory system: Secondary | ICD-10-CM

## 2022-04-08 DIAGNOSIS — E785 Hyperlipidemia, unspecified: Secondary | ICD-10-CM | POA: Diagnosis present

## 2022-04-08 DIAGNOSIS — J939 Pneumothorax, unspecified: Secondary | ICD-10-CM | POA: Diagnosis not present

## 2022-04-08 DIAGNOSIS — Z8049 Family history of malignant neoplasm of other genital organs: Secondary | ICD-10-CM

## 2022-04-08 DIAGNOSIS — J95812 Postprocedural air leak: Secondary | ICD-10-CM | POA: Diagnosis not present

## 2022-04-08 DIAGNOSIS — R918 Other nonspecific abnormal finding of lung field: Secondary | ICD-10-CM | POA: Diagnosis present

## 2022-04-08 DIAGNOSIS — K5900 Constipation, unspecified: Secondary | ICD-10-CM | POA: Diagnosis present

## 2022-04-08 HISTORY — PX: NODE DISSECTION: SHX5269

## 2022-04-08 HISTORY — PX: INTERCOSTAL NERVE BLOCK: SHX5021

## 2022-04-08 LAB — BLOOD GAS, ARTERIAL
Acid-Base Excess: 3.4 mmol/L — ABNORMAL HIGH (ref 0.0–2.0)
Bicarbonate: 26.8 mmol/L (ref 20.0–28.0)
Drawn by: 23853
O2 Saturation: 99.5 %
Patient temperature: 37
pCO2 arterial: 36 mmHg (ref 32–48)
pH, Arterial: 7.48 — ABNORMAL HIGH (ref 7.35–7.45)
pO2, Arterial: 105 mmHg (ref 83–108)

## 2022-04-08 LAB — ABO/RH: ABO/RH(D): O POS

## 2022-04-08 SURGERY — LOBECTOMY, LUNG, ROBOT-ASSISTED, USING VATS
Anesthesia: General | Site: Chest | Laterality: Right

## 2022-04-08 MED ORDER — KETOROLAC TROMETHAMINE 30 MG/ML IJ SOLN
INTRAMUSCULAR | Status: AC
  Filled 2022-04-08: qty 1

## 2022-04-08 MED ORDER — PHENYLEPHRINE HCL-NACL 20-0.9 MG/250ML-% IV SOLN
INTRAVENOUS | Status: DC | PRN
  Administered 2022-04-08: 30 ug/min via INTRAVENOUS

## 2022-04-08 MED ORDER — 0.9 % SODIUM CHLORIDE (POUR BTL) OPTIME
TOPICAL | Status: DC | PRN
  Administered 2022-04-08: 2000 mL

## 2022-04-08 MED ORDER — PROPOFOL 10 MG/ML IV BOLUS
INTRAVENOUS | Status: AC
  Filled 2022-04-08: qty 20

## 2022-04-08 MED ORDER — BISACODYL 5 MG PO TBEC
10.0000 mg | DELAYED_RELEASE_TABLET | Freq: Every day | ORAL | Status: DC
  Administered 2022-04-08 – 2022-04-10 (×3): 10 mg via ORAL
  Filled 2022-04-08 (×4): qty 2

## 2022-04-08 MED ORDER — SUGAMMADEX SODIUM 200 MG/2ML IV SOLN
INTRAVENOUS | Status: DC | PRN
  Administered 2022-04-08 (×2): 200 mg via INTRAVENOUS

## 2022-04-08 MED ORDER — OXYCODONE HCL 5 MG PO TABS: 5.0000 mg | ORAL_TABLET | Freq: Once | ORAL | Status: DC | PRN

## 2022-04-08 MED ORDER — MIDAZOLAM HCL 2 MG/2ML IJ SOLN
INTRAMUSCULAR | Status: DC | PRN
  Administered 2022-04-08: 1 mg via INTRAVENOUS

## 2022-04-08 MED ORDER — VANCOMYCIN HCL IN DEXTROSE 1-5 GM/200ML-% IV SOLN
1000.0000 mg | Freq: Two times a day (BID) | INTRAVENOUS | Status: AC
  Administered 2022-04-08: 1000 mg via INTRAVENOUS
  Filled 2022-04-08: qty 200

## 2022-04-08 MED ORDER — ROCURONIUM BROMIDE 10 MG/ML (PF) SYRINGE
PREFILLED_SYRINGE | INTRAVENOUS | Status: DC | PRN
  Administered 2022-04-08: 70 mg via INTRAVENOUS
  Administered 2022-04-08: 20 mg via INTRAVENOUS

## 2022-04-08 MED ORDER — ACETAMINOPHEN 10 MG/ML IV SOLN
INTRAVENOUS | Status: DC | PRN
  Administered 2022-04-08: 1000 mg via INTRAVENOUS

## 2022-04-08 MED ORDER — MORPHINE SULFATE (PF) 2 MG/ML IV SOLN: 2.0000 mg | INTRAVENOUS | Status: DC | PRN

## 2022-04-08 MED ORDER — PROPOFOL 10 MG/ML IV BOLUS
INTRAVENOUS | Status: DC | PRN
  Administered 2022-04-08: 120 mg via INTRAVENOUS

## 2022-04-08 MED ORDER — ESMOLOL HCL 100 MG/10ML IV SOLN
INTRAVENOUS | Status: DC | PRN
  Administered 2022-04-08: 30 mg via INTRAVENOUS

## 2022-04-08 MED ORDER — METOPROLOL SUCCINATE ER 25 MG PO TB24
25.0000 mg | ORAL_TABLET | Freq: Every day | ORAL | Status: DC
  Administered 2022-04-09 – 2022-04-11 (×3): 25 mg via ORAL
  Filled 2022-04-08 (×3): qty 1

## 2022-04-08 MED ORDER — CHLORHEXIDINE GLUCONATE 0.12 % MT SOLN
OROMUCOSAL | Status: AC
  Administered 2022-04-08: 15 mL via OROMUCOSAL
  Filled 2022-04-08: qty 15

## 2022-04-08 MED ORDER — ENOXAPARIN SODIUM 40 MG/0.4ML IJ SOSY
40.0000 mg | PREFILLED_SYRINGE | Freq: Every day | INTRAMUSCULAR | Status: DC
  Administered 2022-04-09 – 2022-04-10 (×2): 40 mg via SUBCUTANEOUS
  Filled 2022-04-08 (×2): qty 0.4

## 2022-04-08 MED ORDER — LACTATED RINGERS IV SOLN: INTRAVENOUS | Status: DC | PRN

## 2022-04-08 MED ORDER — DEXAMETHASONE SODIUM PHOSPHATE 10 MG/ML IJ SOLN
INTRAMUSCULAR | Status: DC | PRN
  Administered 2022-04-08: 8 mg via INTRAVENOUS

## 2022-04-08 MED ORDER — LACTATED RINGERS IV SOLN: INTRAVENOUS | Status: DC

## 2022-04-08 MED ORDER — VANCOMYCIN HCL IN DEXTROSE 1-5 GM/200ML-% IV SOLN
INTRAVENOUS | Status: AC
  Administered 2022-04-08: 1000 mg via INTRAVENOUS
  Filled 2022-04-08: qty 200

## 2022-04-08 MED ORDER — FENTANYL CITRATE (PF) 250 MCG/5ML IJ SOLN
INTRAMUSCULAR | Status: DC | PRN
  Administered 2022-04-08: 25 ug via INTRAVENOUS
  Administered 2022-04-08: 75 ug via INTRAVENOUS

## 2022-04-08 MED ORDER — LIDOCAINE 2% (20 MG/ML) 5 ML SYRINGE
INTRAMUSCULAR | Status: DC | PRN
  Administered 2022-04-08: 100 mg via INTRAVENOUS

## 2022-04-08 MED ORDER — TRAMADOL HCL 50 MG PO TABS
50.0000 mg | ORAL_TABLET | Freq: Four times a day (QID) | ORAL | Status: DC | PRN
  Filled 2022-04-08: qty 2

## 2022-04-08 MED ORDER — ONDANSETRON HCL 4 MG/2ML IJ SOLN: 4.0000 mg | Freq: Four times a day (QID) | INTRAMUSCULAR | Status: DC | PRN

## 2022-04-08 MED ORDER — BUPIVACAINE HCL (PF) 0.5 % IJ SOLN
INTRAMUSCULAR | Status: AC
  Filled 2022-04-08: qty 30

## 2022-04-08 MED ORDER — KETAMINE HCL 50 MG/5ML IJ SOSY
PREFILLED_SYRINGE | INTRAMUSCULAR | Status: AC
  Filled 2022-04-08: qty 5

## 2022-04-08 MED ORDER — ACETAMINOPHEN 500 MG PO TABS
1000.0000 mg | ORAL_TABLET | Freq: Four times a day (QID) | ORAL | Status: DC
  Administered 2022-04-08 – 2022-04-12 (×9): 1000 mg via ORAL
  Filled 2022-04-08 (×10): qty 2

## 2022-04-08 MED ORDER — KETOROLAC TROMETHAMINE 15 MG/ML IJ SOLN
15.0000 mg | Freq: Four times a day (QID) | INTRAMUSCULAR | Status: DC
  Administered 2022-04-08 – 2022-04-10 (×6): 15 mg via INTRAVENOUS
  Filled 2022-04-08 (×6): qty 1

## 2022-04-08 MED ORDER — KETAMINE HCL 10 MG/ML IJ SOLN
INTRAMUSCULAR | Status: DC | PRN
  Administered 2022-04-08: 25 mg via INTRAVENOUS

## 2022-04-08 MED ORDER — HYDROCHLOROTHIAZIDE 12.5 MG PO TABS
12.5000 mg | ORAL_TABLET | Freq: Every day | ORAL | Status: DC
  Administered 2022-04-09 – 2022-04-11 (×3): 12.5 mg via ORAL
  Filled 2022-04-08 (×3): qty 1

## 2022-04-08 MED ORDER — SENNOSIDES-DOCUSATE SODIUM 8.6-50 MG PO TABS
1.0000 | ORAL_TABLET | Freq: Every day | ORAL | Status: DC
Start: 2022-04-08 — End: 2022-04-12
  Administered 2022-04-08 – 2022-04-09 (×2): 1 via ORAL
  Filled 2022-04-08 (×2): qty 1

## 2022-04-08 MED ORDER — MIDAZOLAM HCL 2 MG/2ML IJ SOLN
INTRAMUSCULAR | Status: AC
  Filled 2022-04-08: qty 2

## 2022-04-08 MED ORDER — HYDROMORPHONE HCL 1 MG/ML IJ SOLN: 0.2500 mg | INTRAMUSCULAR | Status: DC | PRN

## 2022-04-08 MED ORDER — BUPIVACAINE LIPOSOME 1.3 % IJ SUSP
INTRAMUSCULAR | Status: AC
  Filled 2022-04-08: qty 20

## 2022-04-08 MED ORDER — OXYCODONE HCL 5 MG/5ML PO SOLN: 5.0000 mg | Freq: Once | ORAL | Status: DC | PRN

## 2022-04-08 MED ORDER — ACETAMINOPHEN 10 MG/ML IV SOLN
1000.0000 mg | Freq: Once | INTRAVENOUS | Status: DC | PRN
Start: 2022-04-08 — End: 2022-04-08

## 2022-04-08 MED ORDER — FENTANYL CITRATE (PF) 250 MCG/5ML IJ SOLN
INTRAMUSCULAR | Status: AC
  Filled 2022-04-08: qty 5

## 2022-04-08 MED ORDER — VANCOMYCIN HCL IN DEXTROSE 1-5 GM/200ML-% IV SOLN: 1000.0000 mg | INTRAVENOUS | Status: AC

## 2022-04-08 MED ORDER — ONDANSETRON HCL 4 MG/2ML IJ SOLN: 4.0000 mg | Freq: Once | INTRAMUSCULAR | Status: DC | PRN

## 2022-04-08 MED ORDER — CHLORHEXIDINE GLUCONATE 0.12 % MT SOLN: 15.0000 mL | Freq: Once | OROMUCOSAL | Status: AC

## 2022-04-08 MED ORDER — KETOROLAC TROMETHAMINE 30 MG/ML IJ SOLN
INTRAMUSCULAR | Status: DC | PRN
  Administered 2022-04-08: 15 mg via INTRAVENOUS

## 2022-04-08 MED ORDER — ACETAMINOPHEN 160 MG/5ML PO SOLN: 1000.0000 mg | Freq: Four times a day (QID) | ORAL | Status: DC

## 2022-04-08 MED ORDER — GLYCOPYRROLATE 0.2 MG/ML IJ SOLN
INTRAMUSCULAR | Status: DC | PRN
  Administered 2022-04-08: .2 mg via INTRAVENOUS

## 2022-04-08 MED ORDER — ONDANSETRON HCL 4 MG/2ML IJ SOLN
INTRAMUSCULAR | Status: DC | PRN
  Administered 2022-04-08: 4 mg via INTRAVENOUS

## 2022-04-08 MED ORDER — OXYCODONE HCL 5 MG PO TABS
5.0000 mg | ORAL_TABLET | ORAL | Status: DC | PRN
  Administered 2022-04-08 – 2022-04-09 (×2): 10 mg via ORAL
  Filled 2022-04-08 (×2): qty 2

## 2022-04-08 MED ORDER — SODIUM CHLORIDE FLUSH 0.9 % IV SOLN
INTRAVENOUS | Status: DC | PRN
  Administered 2022-04-08: 100 mL

## 2022-04-08 MED ORDER — ORAL CARE MOUTH RINSE: 15.0000 mL | Freq: Once | OROMUCOSAL | Status: AC

## 2022-04-08 SURGICAL SUPPLY — 83 items
BLADE SURG 11 STRL SS (BLADE) ×2 IMPLANT
BNDG COHESIVE 6X5 TAN STRL LF (GAUZE/BANDAGES/DRESSINGS) ×2 IMPLANT
CANISTER SUCT 3000ML PPV (MISCELLANEOUS) ×4 IMPLANT
CANNULA REDUC XI 12-8 STAPL (CANNULA) ×4
CANNULA REDUCER 12-8 DVNC XI (CANNULA) ×2 IMPLANT
CATH THORACIC 28FR (CATHETERS) ×1 IMPLANT
CHLORAPREP W/TINT 26 (MISCELLANEOUS) ×2 IMPLANT
CNTNR URN SCR LID CUP LEK RST (MISCELLANEOUS) ×5 IMPLANT
CONT SPEC 4OZ STRL OR WHT (MISCELLANEOUS) ×10
DEFOGGER SCOPE WARMER CLEARIFY (MISCELLANEOUS) ×2 IMPLANT
DERMABOND ADVANCED (GAUZE/BANDAGES/DRESSINGS) ×2
DERMABOND ADVANCED .7 DNX12 (GAUZE/BANDAGES/DRESSINGS) ×1 IMPLANT
DRAPE ARM DVNC X/XI (DISPOSABLE) ×4 IMPLANT
DRAPE COLUMN DVNC XI (DISPOSABLE) ×1 IMPLANT
DRAPE CV SPLIT W-CLR ANES SCRN (DRAPES) ×2 IMPLANT
DRAPE DA VINCI XI ARM (DISPOSABLE) ×8
DRAPE DA VINCI XI COLUMN (DISPOSABLE) ×2
DRAPE ORTHO SPLIT 77X108 STRL (DRAPES) ×2
DRAPE SURG ORHT 6 SPLT 77X108 (DRAPES) ×1 IMPLANT
ELECT REM PT RETURN 9FT ADLT (ELECTROSURGICAL) ×2
ELECTRODE REM PT RTRN 9FT ADLT (ELECTROSURGICAL) ×1 IMPLANT
GAUZE 4X4 16PLY ~~LOC~~+RFID DBL (SPONGE) ×2 IMPLANT
GAUZE KITTNER 4X8 (MISCELLANEOUS) ×2 IMPLANT
GAUZE SPONGE 4X4 12PLY STRL (GAUZE/BANDAGES/DRESSINGS) ×2 IMPLANT
GLOVE BIO SURGEON STRL SZ7.5 (GLOVE) ×4 IMPLANT
GLOVE SURG POLYISO LF SZ8 (GLOVE) ×2 IMPLANT
GOWN STRL REUS W/ TWL LRG LVL3 (GOWN DISPOSABLE) ×2 IMPLANT
GOWN STRL REUS W/ TWL XL LVL3 (GOWN DISPOSABLE) ×3 IMPLANT
GOWN STRL REUS W/TWL 2XL LVL3 (GOWN DISPOSABLE) ×2 IMPLANT
GOWN STRL REUS W/TWL LRG LVL3 (GOWN DISPOSABLE) ×4
GOWN STRL REUS W/TWL XL LVL3 (GOWN DISPOSABLE) ×6
HEMOSTAT SURGICEL 2X14 (HEMOSTASIS) ×5 IMPLANT
KIT BASIN OR (CUSTOM PROCEDURE TRAY) ×2 IMPLANT
KIT TURNOVER KIT B (KITS) ×2 IMPLANT
NEEDLE 22X1 1/2 (OR ONLY) (NEEDLE) ×2 IMPLANT
NS IRRIG 1000ML POUR BTL (IV SOLUTION) ×6 IMPLANT
PACK CHEST (CUSTOM PROCEDURE TRAY) ×2 IMPLANT
PAD ARMBOARD 7.5X6 YLW CONV (MISCELLANEOUS) ×10 IMPLANT
PORT ACCESS TROCAR AIRSEAL 12 (TROCAR) ×1 IMPLANT
PORT ACCESS TROCAR AIRSEAL 5M (TROCAR) ×2
RELOAD STAPLE 45 2.0 GRY DVNC (STAPLE) IMPLANT
RELOAD STAPLE 45 2.5 WHT DVNC (STAPLE) IMPLANT
RELOAD STAPLE 45 3.5 BLU DVNC (STAPLE) IMPLANT
RELOAD STAPLE 45 4.3 GRN DVNC (STAPLE) IMPLANT
RELOAD STAPLER 2.5X45 WHT DVNC (STAPLE) ×4 IMPLANT
RELOAD STAPLER 3.5X45 BLU DVNC (STAPLE) ×6 IMPLANT
RELOAD STAPLER 4.3X45 GRN DVNC (STAPLE) ×1 IMPLANT
SEAL CANN UNIV 5-8 DVNC XI (MISCELLANEOUS) ×2 IMPLANT
SEAL XI 5MM-8MM UNIVERSAL (MISCELLANEOUS) ×4
SET TRI-LUMEN FLTR TB AIRSEAL (TUBING) ×2 IMPLANT
SOLUTION ELECTROLUBE (MISCELLANEOUS) ×1 IMPLANT
SPONGE T-LAP 18X18 ~~LOC~~+RFID (SPONGE) ×5 IMPLANT
STAPLE RELOAD 45 2.0 GRAY (STAPLE) ×4
STAPLE RELOAD 45 2.0 GRAY DVNC (STAPLE) ×2 IMPLANT
STAPLER 45 SUREFORM CVD (STAPLE) ×2
STAPLER 45 SUREFORM CVD DVNC (STAPLE) IMPLANT
STAPLER CANNULA SEAL DVNC XI (STAPLE) ×2 IMPLANT
STAPLER CANNULA SEAL XI (STAPLE) ×4
STAPLER RELOAD 2.5X45 WHITE (STAPLE) ×8
STAPLER RELOAD 2.5X45 WHT DVNC (STAPLE) ×4
STAPLER RELOAD 3.5X45 BLU DVNC (STAPLE) ×6
STAPLER RELOAD 3.5X45 BLUE (STAPLE) ×12
STAPLER RELOAD 4.3X45 GREEN (STAPLE) ×2
STAPLER RELOAD 4.3X45 GRN DVNC (STAPLE) ×1
STOPCOCK 4 WAY LG BORE MALE ST (IV SETS) ×2 IMPLANT
SUT SILK  1 MH (SUTURE) ×2
SUT SILK 1 MH (SUTURE) ×1 IMPLANT
SUT SILK 2 0 SH (SUTURE) ×1 IMPLANT
SUT VIC AB 2-0 CT1 27 (SUTURE) ×2
SUT VIC AB 2-0 CT1 TAPERPNT 27 (SUTURE) ×1 IMPLANT
SUT VIC AB 3-0 SH 27 (SUTURE) ×6
SUT VIC AB 3-0 SH 27X BRD (SUTURE) ×2 IMPLANT
SUT VICRYL 0 TIES 12 18 (SUTURE) ×2 IMPLANT
SUT VICRYL 0 UR6 27IN ABS (SUTURE) ×4 IMPLANT
SYR 10ML LL (SYRINGE) ×2 IMPLANT
SYR 20ML LL LF (SYRINGE) ×2 IMPLANT
SYR 50ML LL SCALE MARK (SYRINGE) ×2 IMPLANT
SYSTEM RETRIEVAL ANCHOR 15 (MISCELLANEOUS) ×1 IMPLANT
SYSTEM SAHARA CHEST DRAIN ATS (WOUND CARE) ×2 IMPLANT
TAPE CLOTH 4X10 WHT NS (GAUZE/BANDAGES/DRESSINGS) ×2 IMPLANT
TOWEL GREEN STERILE (TOWEL DISPOSABLE) ×2 IMPLANT
TRAY FOLEY MTR SLVR 16FR STAT (SET/KITS/TRAYS/PACK) ×2 IMPLANT
TUBING EXTENTION W/L.L. (IV SETS) ×2 IMPLANT

## 2022-04-08 NOTE — Anesthesia Procedure Notes (Signed)
Arterial Line Insertion Start/End7/07/2022 10:30 AM Performed by: Myrtie Soman, MD, Georgia Duff, CRNA, anesthesiologist  Patient location: Pre-op. Preanesthetic checklist: patient identified, IV checked, site marked, risks and benefits discussed, surgical consent, monitors and equipment checked, pre-op evaluation, timeout performed and anesthesia consent Lidocaine 1% used for infiltration and patient sedated Right, radial was placed Catheter size: 20 G Hand hygiene performed , maximum sterile barriers used  and Seldinger technique used  Attempts: 1 Procedure performed without using ultrasound guided technique. Following insertion, dressing applied and Biopatch. Post procedure assessment: normal  Patient tolerated the procedure well with no immediate complications.

## 2022-04-08 NOTE — Transfer of Care (Signed)
Immediate Anesthesia Transfer of Care Note  Patient: Erica Greene  Procedure(s) Performed: XI ROBOTIC ASSISTED THORASCOPY-RIGHT UPPER LOBECTOMY (Right: Chest) NODE DISSECTION (Right: Chest) INTERCOSTAL NERVE BLOCK (Right: Chest)  Patient Location: PACU  Anesthesia Type:General  Level of Consciousness: awake, drowsy and patient cooperative  Airway & Oxygen Therapy: Patient Spontanous Breathing and Patient connected to nasal cannula oxygen  Post-op Assessment: Report given to RN, Post -op Vital signs reviewed and stable and Patient moving all extremities  Post vital signs: Reviewed and stable  Last Vitals:  Vitals Value Taken Time  BP 162/81 04/08/22 1500  Temp    Pulse 57 04/08/22 1502  Resp 16 04/08/22 1502  SpO2 98 % 04/08/22 1502  Vitals shown include unvalidated device data.  Last Pain:  Vitals:   04/08/22 0955  TempSrc:   PainSc: 0-No pain         Complications: No notable events documented.

## 2022-04-08 NOTE — Progress Notes (Signed)
Admission from PACU by bed awake and alert. Whole body covered with blanket, claiming she feels cold.

## 2022-04-08 NOTE — Anesthesia Preprocedure Evaluation (Signed)
Anesthesia Evaluation  Patient identified by MRN, date of birth, ID band Patient awake    Reviewed: Allergy & Precautions, NPO status , Patient's Chart, lab work & pertinent test results  Airway Mallampati: II  TM Distance: >3 FB Neck ROM: Full    Dental no notable dental hx.    Pulmonary neg pulmonary ROS,    Pulmonary exam normal breath sounds clear to auscultation       Cardiovascular hypertension, Pt. on medications and Pt. on home beta blockers Normal cardiovascular exam Rhythm:Regular Rate:Normal     Neuro/Psych negative neurological ROS  negative psych ROS   GI/Hepatic negative GI ROS, Neg liver ROS,   Endo/Other  negative endocrine ROS  Renal/GU negative Renal ROS  negative genitourinary   Musculoskeletal negative musculoskeletal ROS (+)   Abdominal   Peds negative pediatric ROS (+)  Hematology negative hematology ROS (+)   Anesthesia Other Findings   Reproductive/Obstetrics negative OB ROS                             Anesthesia Physical Anesthesia Plan  ASA: 2  Anesthesia Plan: General   Post-op Pain Management:    Induction: Intravenous  PONV Risk Score and Plan: 3 and Ondansetron, Dexamethasone and Treatment may vary due to age or medical condition  Airway Management Planned: Double Lumen EBT  Additional Equipment: Arterial line  Intra-op Plan:   Post-operative Plan: Extubation in OR  Informed Consent: I have reviewed the patients History and Physical, chart, labs and discussed the procedure including the risks, benefits and alternatives for the proposed anesthesia with the patient or authorized representative who has indicated his/her understanding and acceptance.     Dental advisory given  Plan Discussed with: CRNA and Surgeon  Anesthesia Plan Comments:         Anesthesia Quick Evaluation

## 2022-04-08 NOTE — Anesthesia Postprocedure Evaluation (Signed)
Anesthesia Post Note  Patient: Erica Greene  Procedure(s) Performed: XI ROBOTIC ASSISTED THORASCOPY-RIGHT UPPER LOBECTOMY (Right: Chest) NODE DISSECTION (Right: Chest) INTERCOSTAL NERVE BLOCK (Right: Chest)     Patient location during evaluation: PACU Anesthesia Type: General Level of consciousness: awake Pain management: pain level controlled Vital Signs Assessment: post-procedure vital signs reviewed and stable Respiratory status: spontaneous breathing, nonlabored ventilation, respiratory function stable and patient connected to nasal cannula oxygen Cardiovascular status: blood pressure returned to baseline and stable Postop Assessment: no apparent nausea or vomiting Anesthetic complications: no   No notable events documented.  Last Vitals:  Vitals:   04/08/22 1630 04/08/22 1713  BP: (!) 151/67 (!) 160/68  Pulse: 60 68  Resp: (!) 24 14  Temp:  36.6 C  SpO2: 99% 100%    Last Pain:  Vitals:   04/08/22 1713  TempSrc: Oral  PainSc:                  Wynell Halberg P Emmett Arntz

## 2022-04-08 NOTE — Anesthesia Procedure Notes (Signed)
Procedure Name: Intubation Date/Time: 04/08/2022 12:02 PM  Performed by: Georgia Duff, CRNAPre-anesthesia Checklist: Patient identified, Emergency Drugs available, Suction available and Patient being monitored Patient Re-evaluated:Patient Re-evaluated prior to induction Oxygen Delivery Method: Circle system utilized Preoxygenation: Pre-oxygenation with 100% oxygen Induction Type: IV induction Ventilation: Mask ventilation without difficulty Laryngoscope Size: Mac and 4 (Grade with MAC 3) Grade View: Grade II Tube type: Oral Endobronchial tube: Double lumen EBT and 35 Fr Number of attempts: 1 Airway Equipment and Method: Stylet and Oral airway Placement Confirmation: ETT inserted through vocal cords under direct vision, positive ETCO2 and breath sounds checked- equal and bilateral Secured at: 25 cm Tube secured with: Tape Dental Injury: Teeth and Oropharynx as per pre-operative assessment

## 2022-04-08 NOTE — Op Note (Signed)
      [address]       [place] [address]             [phone]        04/08/2022  Patient:  [name] Pre-Op Dx: Right upper lobe pulmonary nodule Post-op Dx: Same Procedure: - Robotic assisted right video thoracoscopy -Right upper lobectomy - Mediastinal lymph node sampling - Intercostal nerve block  Surgeon and Role:      * Lajuana Matte, MD - Primary  Assistant: E.Barrett, PA-C  An experienced assistant was required given the complexity of this surgery and the standard of surgical care. The assistant was needed for exposure, dissection, suctioning, retraction of delicate tissues and sutures, instrument exchange and for overall help during this procedure.    Anesthesia  general EBL: 100 ml Blood Administration: None Specimen: Right upper lobe, hilar and mediastinal lymph nodes  Drains: 28 F argyle chest tube in right chest Counts: correct   Indications: 76 year old female with a 1.3 cm right upper lobe pulmonary nodule with minimal uptake on PET/CT.  There is concern that this may be a low-grade primary lung cancer.  Given its location within the lung a direct wedge resection will be challenging without removing a significant portion.  We discussed the risk and benefits of a combined procedure with a robotic navigational bronchoscopy and biopsy followed by a robotic assisted thoracoscopy with right upper lobectomy.  She ultimately decided to proceed directly with a lobectomy.     Findings: Normal anatomy.  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  Operative Technique: After the risks, benefits and alternatives were thoroughly discussed, the patient was brought to the operative theatre.  Anesthesia was induced, and the patient was then placed in a left lateral decubitus position and was prepped and draped in normal sterile fashion.  An appropriate surgical pause was performed, and pre-operative antibiotics were dosed  accordingly.  We began by placing our 4 robotic ports in the the 7th intercostal space targeting the hilum of the lung.  A 36mm assistant port was placed in the 9th intercostal space in the anterior axillary line.  The robot was then docked and all instruments were passed under direct visualization.    The lung was then retracted superiorly, and the inferior pulmonary ligament was divided.  The hilum was mobilized anteriorly and posteriorly.  We identified the upper lobe pulmonary vein, and after careful isolation, it was divided with a vascular stapler.  We next moved to the pulmonary artery.  The artery was then divided with a vascular load stapler.  The bronchus to the upper lobe was then isolated.  After a test clamp, with good ventilation of the middle and lower lobes, the bronchus was then divided.  The fissure was completed, and the specimen was passed into an endocatch bag.  It was removed from the anterior access site.    Lymph nodes were then sampled at levels 7, 9 and the hilum.  The chest was irrigated, and an air leak test was performed.  An intercostal nerve block was performed under direct visualization.  A 28 F chest tube was then placed, and we watch the remaining lobes re-expand.  The skin and soft tissue were closed with absorbable suture    The patient tolerated the procedure without any immediate complications, and was transferred to the PACU in stable condition.  Tyresse Jayson Bary Leriche

## 2022-04-08 NOTE — Interval H&P Note (Signed)
History and Physical Interval Note:  04/08/2022 11:01 AM  Erica Greene  has presented today for surgery, with the diagnosis of PULMONARY NODULE.  The various methods of treatment have been discussed with the patient and family. After consideration of risks, benefits and other options for treatment, the patient has consented to  Procedure(s): XI ROBOTIC ASSISTED THORASCOPY-RIGHT UPPER LOBECTOMY (Right) as a surgical intervention.  The patient's history has been reviewed, patient examined, no change in status, stable for surgery.  I have reviewed the patient's chart and labs.  Questions were answered to the patient's satisfaction.     Reba Hulett Bary Leriche

## 2022-04-08 NOTE — Brief Op Note (Signed)
04/08/2022  2:33 PM  PATIENT:  Erica Greene  76 y.o. female  PRE-OPERATIVE DIAGNOSIS:  PULMONARY NODULE  POST-OPERATIVE DIAGNOSIS:  PULMONARY NODULE  PROCEDURE:  Procedure(s):  XI ROBOTIC ASSISTED THORASCOPY-RIGHT UPPER LOBECTOMY (Right) NODE DISSECTION (Right) INTERCOSTAL NERVE BLOCK (Right)  SURGEON:  Surgeon(s) and Role:    * Lightfoot, Lucile Crater, MD - Primary  PHYSICIAN ASSISTANT: Ellwood Handler PA-C  ASSISTANTS: none   ANESTHESIA:   general  EBL:  100 mL   BLOOD ADMINISTERED:none  DRAINS:  Cleone Slim, right chest    LOCAL MEDICATIONS USED:  MARCAINE     SPECIMEN:  Source of Specimen:  Right Upper Lobe, Lymph Nodes  DISPOSITION OF SPECIMEN:  PATHOLOGY  COUNTS:  YES  TOURNIQUET:  * No tourniquets in log *  DICTATION: .Dragon Dictation  PLAN OF CARE: Admit to inpatient   PATIENT DISPOSITION:  PACU - hemodynamically stable.   Delay start of Pharmacological VTE agent (>24hrs) due to surgical blood loss or risk of bleeding: no

## 2022-04-08 NOTE — Hospital Course (Addendum)
History of Present Illness:  Erica Greene is a 76 yo female with history of HTN and pulmonary nodules. The patient has been followed for a right upper lobe ground glass lesion that has been slowly enlarging.  This was initially diagnosed in April of 2022.  Her most recent CT scan showed the lesion's morphology to be concerning for low-grade adenocarcinoma.  The patient ultimately decided she wanted to undergo surgical resection of the area.  She was referred to Dr. Kipp Brood at which time the patient was asymptomatic.  It was felt the area of the nodule would be challenging to wedge out.  It was felt she would benefit from lobectomy to fully remove the nodule.  This could be done via robotic assisted approach. The risks and benefits of the procedure were explained to the patient and she was agreeable to proceed.

## 2022-04-09 ENCOUNTER — Encounter (HOSPITAL_COMMUNITY): Payer: Self-pay | Admitting: Thoracic Surgery (Cardiothoracic Vascular Surgery)

## 2022-04-09 ENCOUNTER — Inpatient Hospital Stay (HOSPITAL_COMMUNITY): Payer: Medicare Other

## 2022-04-09 LAB — BASIC METABOLIC PANEL
Anion gap: 7 (ref 5–15)
BUN: 12 mg/dL (ref 8–23)
CO2: 20 mmol/L — ABNORMAL LOW (ref 22–32)
Calcium: 8.4 mg/dL — ABNORMAL LOW (ref 8.9–10.3)
Chloride: 102 mmol/L (ref 98–111)
Creatinine, Ser: 0.84 mg/dL (ref 0.44–1.00)
GFR, Estimated: >60 mL/min (ref >60)
Glucose, Bld: 146 mg/dL — ABNORMAL HIGH (ref 70–99)
Potassium: 4.5 mmol/L (ref 3.5–5.1)
Sodium: 129 mmol/L — ABNORMAL LOW (ref 135–145)

## 2022-04-09 NOTE — Plan of Care (Signed)

## 2022-04-09 NOTE — Progress Notes (Signed)
Mobility Specialist Progress Note    04/09/22 1433  Mobility  Activity Ambulated with assistance in hallway  Level of Assistance Contact guard assist, steadying assist  Assistive Device Front wheel walker  Distance Ambulated (ft) 110 ft  Activity Response Tolerated fair  $Mobility charge 1 Mobility   Pre-Mobility: 88 HR Post-Mobility: 80 HR  Pt received in bed and agreeable. C/o SOB with activity. Pt took frequent short standing rest breaks. Encouraged pursed lip breathing. Returned to chair with call bell in reach. RN notified.   Hildred Alamin Mobility Specialist

## 2022-04-09 NOTE — TOC Progression Note (Signed)
Transition of Care [facility]) - Progression Note    Patient Details  Name: Erica Greene MRN: [id number] Date of Birth: December 24, 1945  Transition of Care [facility]) CM/SW [place], RN Phone Number:[phone]  04/09/2022, 3:59 PM  Clinical Narrative:    TOC following patient POD # 1 RIGHT UPPER LOBECTOMY.  Transition of Care Department [facility]) has reviewed patient and no TOC needs have been identified at this time. We will continue to monitor patient advancement through interdisciplinary progression rounds         Expected Discharge Plan and Services                                                 Social Determinants of Health (SDOH) Interventions    Readmission Risk Interventions     No data to display

## 2022-04-09 NOTE — Discharge Summary (Incomplete)
Physician Discharge Summary  Patient ID: Erica Greene MRN: [id number] DOB/AGE: June 04, 1946 76 y.o.  Admit date: 04/08/2022 Discharge date:04/12/2022 Admission Diagnoses:  Pulmonary nodules Asthma Hypertension Dyslipidemia  Discharge Diagnoses:   Pulmonary nodules Asthma Hypertension Dyslipidemia S/P robotic-assisted right upper lobectomy of lung  Treatments: Surgery 04/08/22 Robotic-assisted right upper lobectomy   Discharged Condition: good  History of Present Illness:  Erica Greene is a 76 yo female with history of HTN and pulmonary nodules. The patient has been followed for a right upper lobe ground glass lesion that has been slowly enlarging.  This was initially diagnosed in April of 2022.  Her most recent CT scan showed the lesion's morphology to be concerning for low-grade adenocarcinoma.  The patient ultimately decided she wanted to undergo surgical resection of the area.  She was referred to Dr. Kipp Brood at which time the patient was asymptomatic.  It was felt the area of the nodule would be challenging to wedge out.  It was felt she would benefit from lobectomy to fully remove the nodule.  This could be done via robotic assisted approach. The risks and benefits of the procedure were explained to the patient and she was agreeable to proceed.    Hospital Course:  Erica Greene presented to [facility] on 04/08/2022.  She was taken to the operating room and underwent Robotic Assisted Right Video Thoracoscopy with right upper lobectomy, lymph node dissection, and intercostal nerve block.  She tolerated the procedure without difficulty, was extubated, and taken to the PACU in stable condition. On post-op day one she was noted to have a small air leak and expected right apical space on chest x-ray.  This was monitored closely with serial chest x-rays.  It did show overall improvement over time.  She was reasonably comfortable and was oxygenating well. Diet was begun and she  was mobilized. The chest tube was left to water seal and on postop day 4 she underwent a clamp trial which showed an unchanged small right space.  Renal function has remained within normal limits following a slight bump on postoperative day #1-1.35.  He is noted not to have a postoperative anemia.  Oxygen was weaned and he maintained good oxygen saturations on room air.  She was tolerating routine activity.  Incisions were healing well without evidence of infection.  Overall, at the time of discharge the patient was felt to be quite stable.   Consults: None  Significant Diagnostic Studies:   CLINICAL DATA:  Pneumothorax.   EXAM: PORTABLE CHEST 1 VIEW   COMPARISON:  04/10/2022.   FINDINGS: Similar size of a right moderate pneumothorax. Similar position of right chest tube. Lungs are clear. Postsurgical changes of right lobectomy. Unchanged cardiomediastinal silhouette. No acute osseous abnormality.   IMPRESSION: Similar size of a right moderate pneumothorax with chest tube in place.     Electronically Signed   By: Margaretha Sheffield M.D.   On: 04/11/2022 08:15   Path: SURGICAL PATHOLOGY  CASE: [id number]  PATIENT: Erica Greene  Surgical Pathology Report   Clinical History: pulmonary nodule (cm)   FINAL MICROSCOPIC DIAGNOSIS:   A. LUNG, RIGHT UPPER LOBE, LOBECTOMY:  - Invasive lung adenocarcinoma, acinar predominant, 1.2 cm  - Visceral pleura is not involved  - Resection margin is negative for carcinoma  - Four benign lymph nodes (0/4)  - See oncology table   B. LYMPH NODE, LEVEL 9, EXCISION:  - Lymph node, negative for carcinoma (0/1)   C. LYMPH NODE, HILAR, EXCISION:  - Lymph  node, negative for carcinoma (0/1)   D. LYMPH NODE, HILAR #2, EXCISION:  - Lymph node, negative for carcinoma (0/1)   E. LYMPH NODE, HILAR #3, EXCISION:  - Lymph node, negative for carcinoma (0/1)   F. LYMPH NODE, LEVEL 7, EXCISION:  - Lymph node, negative for carcinoma (0/1)   G.  LYMPH NODE, LEVEL 7 #2, EXCISION:  - Lymph node, negative for carcinoma (0/1)   H. LYMPH NODE, LEVEL 7 #3, EXCISION:  - Lymph node, negative for carcinoma (0/1)    ONCOLOGY TABLE:   LUNG: Resection   Synchronous Tumors: Not applicable  Total Number of Primary Tumors: 1  Procedure: Lobectomy, lung  Specimen Laterality: Right  Tumor Focality: Unifocal  Tumor Site: Upper lobe  Tumor Size: 1.2 cm  Histologic Type: Adenocarcinoma, acinar predominant  Visceral Pleura Invasion: Not identified  Direct Invasion of Adjacent Structures: No adjacent structures present  Lymphovascular Invasion: Not identified  Margins: All margins negative for invasive carcinoma       Closest Margin(s) to Invasive Carcinoma: Bronchovascular margin       Margin(s) Involved by Invasive Carcinoma: Not applicable        Margin Status for Non-Invasive Tumor: Not applicable  Treatment Effect: No known presurgical therapy  Regional Lymph Nodes:       Number of Lymph Nodes Involved: 0                            Nodal Sites with Tumor: Not applicable       Number of Lymph Nodes Examined: 11                       Nodal Sites Examined: Levels 7, 9 and 10  Distant Metastasis:       Distant Site(s) Involved: Not applicable  Pathologic Stage Classification (pTNM, AJCC 8th Edition): pT1b, pN0  Ancillary Studies: Can be performed upon request  Representative Tumor Block: A1  Comment(s): None   (v4.2.0.1)    INTRAOPERATIVE DIAGNOSIS:  A.  Right upper lobectomy: "Adenocarcinoma"  Intraoperative diagnosis rendered by Dr. Alric Seton at 2:36 PM on  04/09/2022.   GROSS DESCRIPTION:   A: Specimen: Right upper lobectomy, received fresh for rapid  intraoperative consult.  Specimen integrity: Intact  Size, weight: 16.2 x 9.7 x 3.5 cm, 106 g  Pleura: The pleura is smooth, tan-red anthracotic.  There is a suture  attached identifying a nodule.  Lesion: At the attached suture there is a 1.2 x 1 x 0.9 cm firm white   nodule with ill-defined borders.  Margin(s): The nodule is located 2.5 cm from the bronchial margin.  The  nodule does not involve the pleura.  Hilar vessels: Grossly free of tumor  Nonneoplastic parenchyma: The uninvolved parenchyma is spongy red-brown.  Lymph nodes: At the hilum there are 4 anthracotic lymph nodes measuring  0.6 to 1.2 cm greatest dimension.  Block Summary:  A section of the nodule is submitted for frozen section.  Sections are  submitted in 8 cassettes.  1 = section of nodule submitted for frozen section  2 = bronchial margin  3 = vascular margins  4, 5 = remainder of nodule  6 = uninvolved parenchyma  7 = 3 whole lymph nodes  8 = 1 sectioned lymph node   B: Received fresh is a 1 x 0.6 x 0.3 cm portion of soft anthracotic  tissue.  The specimen is submitted in toto.  C: Received fresh is a 0.7 x 0.5 x 0.2 cm portion of soft tan-yellow  tissue.  The specimen is submitted in toto.   D: Received fresh is a 0.7 x 0.5 x 0.3 cm portion of soft anthracotic  tissue.  The specimen is submitted in toto.   E: Received fresh is a 0.6 x 0.5 x 0.2 cm portion of soft anthracotic  tissue.  The specimen is submitted in toto.   F: Received fresh is a 1.6 x 0.7 x 0.5 cm soft anthracotic nodule.  The  specimen is sectioned and entirely submitted in 1 cassette.   G: Received fresh is a 1 x 0.7 x 0.6 cm soft anthracotic nodule.  The  specimen is bisected and entirely submitted in 1 cassette.   H: Received fresh is a 1 x 0.9 x 0.5 cm soft anthracotic nodule.  The  specimen is sectioned and entirely submitted in 1 cassette.  [facility]  04/09/2022)     Final Diagnosis performed by Jaquita Folds, MD.   Electronically  signed 04/10/2022  Technical component performed at [facility]. [facility], [place] [address], [place], [place] [address].   Professional component performed at [facility],  [place] [address] [place], [place] [address].    Immunohistochemistry Technical component (if applicable) was performed  at [facility]. [address], [place],  [place], [place] [address].   IMMUNOHISTOCHEMISTRY DISCLAIMER (if applicable):  Some of these immunohistochemical stains may have been developed and the  performance characteristics determine by [facility]. Some  may not have been cleared or approved by the U.S. Food and Drug  Administration. The FDA has determined that such clearance or approval  is not necessary. This test is used for clinical purposes. It should not  be regarded as investigational or for research. This laboratory is  certified under the [place]  (CLIA-88) as qualified to perform high complexity clinical laboratory  testing.  The controls stained appropriately.   Discharge Exam: Blood pressure (!) 149/76, pulse 100, temperature 98.1 F (36.7 C), temperature source Oral, resp. rate 20, height 4\' 9"  (1.448 m), weight 64.4 kg, SpO2 97 %.  output data recorded.   General appearance: alert, cooperative, and no distress Heart: regular rate and rhythm Lungs: mildly dim in bases Abdomen: benign Extremities: no edema or calf tenderness Wound: incis healing well Disposition: Discharge disposition: 01-Home or Self Care       Discharge Instructions     Discharge patient   Complete by: As directed    Discharge disposition: 01-Home or Self Care   Discharge patient date: 04/12/2022      Allergies as of 04/12/2022       Reactions   Penicillins Anaphylaxis   Amlodipine Swelling   Gums swell   Lisinopril Swelling   Facial swelling   Sulfa Antibiotics Itching   Latex Rash        Medication List     STOP taking these medications    metoprolol succinate 25 MG 24 hr tablet Commonly known as: TOPROL-XL       TAKE these medications    acetaminophen 325 MG tablet Commonly known as: TYLENOL Take 2 tablets (650 mg total)  by mouth every 4 (four) hours as needed for mild pain or moderate pain.   carvedilol 3.125 MG tablet Commonly known as: COREG Take 1 tablet (3.125 mg total) by mouth 2 (two) times daily with a meal.   [employer]  VIT D 5000 HIGH-POTENCY PO Take 5,000 Units by mouth daily.   Fish Oil 1000 MG Caps Take 1,000 mg by mouth daily.   hydrochlorothiazide 50 MG tablet Commonly known as: HYDRODIURIL Take 1 tablet (50 mg total) by mouth daily. What changed:  medication strength how much to take   PROBIOTIC DAILY PO Take 1 Capful by mouth daily.   traMADol 50 MG tablet Commonly known as: ULTRAM Take 1 tablet (50 mg total) by mouth every 6 (six) hours as needed for up to 7 days (mild pain).   Turmeric 500 MG Caps Take 500 mg by mouth daily.        Follow-up Information     Lajuana Matte, MD. Go on 04/25/2022.   Specialty: Cardiothoracic Surgery Why: Your appointnemt is at 10:50am Please arrive 30 minutes early for a chest x-ray to be performed by [facility] located on the first floor of the same building. Contact information: [address] [place] [address] [phone]                 Signed: John Giovanni, PA-C 04/14/2022, 8:21 AM

## 2022-04-09 NOTE — Discharge Instructions (Signed)

## 2022-04-09 NOTE — Progress Notes (Addendum)
      [address]       [place], [address]             [phone]      1 Day Post-Op Procedure(s) (LRB): XI ROBOTIC ASSISTED THORASCOPY-RIGHT UPPER LOBECTOMY (Right) NODE DISSECTION (Right) INTERCOSTAL NERVE BLOCK (Right) Subjective: Awake and alert, says pain is well controlled. Breathing OK.   Objective: Vital signs in last 24 hours: Temp:  [96.4 F (35.8 C)-98.1 F (36.7 C)] 97.9 F (36.6 C) (07/12 0258) Pulse Rate:  [55-72] 63 (07/12 0403) Cardiac Rhythm: Normal sinus rhythm (07/12 0706) Resp:  [13-24] 17 (07/12 0403) BP: (123-169)/(59-101) 123/59 (07/12 0258) SpO2:  [96 %-100 %] 99 % (07/12 0403) Arterial Line BP: (155-170)/(74-82) 162/74 (07/11 1615) Weight:  [64.4 kg] 64.4 kg (07/11 0942)     Intake/Output from previous day: 07/11 0701 - 07/12 0700 In: 1740 [P.O.:240; I.V.:1200; IV Piggyback:300] Out: 9381 [Urine:795; Blood:100; Chest Tube:300] Intake/Output this shift: No intake/output data recorded.  General appearance: alert, cooperative, and no distress Neurologic: intact Heart: RRR, monitor showing NSR Lungs: Clear and full breath sounds on left, coarse, diminished on the right. Active air leak with deep breathing. CT drainage 122ml past 12 hours.  Wound: Port incisions are intact and dry.   Lab Results: No results for input(s): "WBC", "HGB", "HCT", "PLT" in the last 72 hours. BMET:  Recent Labs    04/09/22 0428  NA 129*  K 4.5  CL 102  CO2 20*  GLUCOSE 146*  BUN 12  CREATININE 0.84  CALCIUM 8.4*    PT/INR: No results for input(s): "LABPROT", "INR" in the last 72 hours. ABG    Component Value Date/Time   PHART 7.48 (H) 04/08/2022 1032   HCO3 26.8 04/08/2022 1032   O2SAT 99.5 04/08/2022 1032   CBG (last 3)  No results for input(s): "GLUCAP" in the last 72 hours.  Assessment/Plan: S/P Procedure(s) (LRB): XI ROBOTIC ASSISTED THORASCOPY-RIGHT UPPER LOBECTOMY (Right) NODE DISSECTION (Right) INTERCOSTAL NERVE BLOCK  (Right)  -POD1 robotic-assisted RUL. Stable respiratory status and pain controlled. Has air leak and expected right apical space on CXR. Leave the CT to water seal. Mobilize.   -History of HTN- BP controlled since surgery. Re-starting her toprol and HCTZ today.   DVT PPX- to start daily enoxaparin today. Ambulate.   LOS: 1 day    Antony Odea, PA-C [phone] 04/09/2022  Agree with above + air leak, and small pneumoT IS, ambulation  Kameo Bains O Elizette Shek

## 2022-04-10 ENCOUNTER — Inpatient Hospital Stay (HOSPITAL_COMMUNITY): Payer: Medicare Other

## 2022-04-10 LAB — COMPREHENSIVE METABOLIC PANEL
ALT: 18 U/L (ref 0–44)
AST: 33 U/L (ref 15–41)
Albumin: 3.3 g/dL — ABNORMAL LOW (ref 3.5–5.0)
Alkaline Phosphatase: 38 U/L (ref 38–126)
Anion gap: 12 (ref 5–15)
BUN: 26 mg/dL — ABNORMAL HIGH (ref 8–23)
CO2: 20 mmol/L — ABNORMAL LOW (ref 22–32)
Calcium: 8.9 mg/dL (ref 8.9–10.3)
Chloride: 97 mmol/L — ABNORMAL LOW (ref 98–111)
Creatinine, Ser: 1.35 mg/dL — ABNORMAL HIGH (ref 0.44–1.00)
GFR, Estimated: 41 mL/min — ABNORMAL LOW (ref >60)
Glucose, Bld: 158 mg/dL — ABNORMAL HIGH (ref 70–99)
Potassium: 4.4 mmol/L (ref 3.5–5.1)
Sodium: 129 mmol/L — ABNORMAL LOW (ref 135–145)
Total Bilirubin: 1 mg/dL (ref 0.3–1.2)
Total Protein: 6.1 g/dL — ABNORMAL LOW (ref 6.5–8.1)

## 2022-04-10 LAB — CBC
HCT: 36.4 % (ref 36.0–46.0)
Hemoglobin: 12.4 g/dL (ref 12.0–15.0)
MCH: 28.9 pg (ref 26.0–34.0)
MCHC: 34.1 g/dL (ref 30.0–36.0)
MCV: 84.8 fL (ref 80.0–100.0)
Platelets: 322 10*3/uL (ref 150–400)
RBC: 4.29 MIL/uL (ref 3.87–5.11)
RDW: 13.8 % (ref 11.5–15.5)
WBC: 13.3 10*3/uL — ABNORMAL HIGH (ref 4.0–10.5)
nRBC: 0 % (ref 0.0–0.2)

## 2022-04-10 LAB — SURGICAL PATHOLOGY

## 2022-04-10 MED ORDER — POLYETHYLENE GLYCOL 3350 17 G PO PACK
17.0000 g | PACK | Freq: Every day | ORAL | Status: DC | PRN
  Administered 2022-04-10: 17 g via ORAL
  Filled 2022-04-10: qty 1

## 2022-04-10 MED ORDER — LACTULOSE 10 GM/15ML PO SOLN: 20.0000 g | Freq: Every day | ORAL | Status: DC | PRN

## 2022-04-10 MED ORDER — ENOXAPARIN SODIUM 30 MG/0.3ML IJ SOSY
30.0000 mg | PREFILLED_SYRINGE | Freq: Every day | INTRAMUSCULAR | Status: DC
  Administered 2022-04-11: 30 mg via SUBCUTANEOUS
  Filled 2022-04-10: qty 0.3

## 2022-04-10 MED ORDER — METOCLOPRAMIDE HCL 5 MG/ML IJ SOLN
10.0000 mg | Freq: Four times a day (QID) | INTRAMUSCULAR | Status: AC
  Administered 2022-04-10 – 2022-04-11 (×3): 10 mg via INTRAVENOUS
  Filled 2022-04-10 (×3): qty 2

## 2022-04-10 MED ORDER — PANTOPRAZOLE SODIUM 40 MG IV SOLR
40.0000 mg | Freq: Two times a day (BID) | INTRAVENOUS | Status: DC
  Administered 2022-04-10 (×2): 40 mg via INTRAVENOUS
  Filled 2022-04-10 (×2): qty 10

## 2022-04-10 MED ORDER — PANTOPRAZOLE SODIUM 40 MG IV SOLR: 40.0000 mg | INTRAVENOUS | Status: DC

## 2022-04-10 NOTE — Progress Notes (Signed)
Mobility Specialist Progress Note    04/10/22 1620  Mobility  Activity Ambulated with assistance in hallway  Level of Assistance Standby assist, set-up cues, supervision of patient - no hands on  Assistive Device Front wheel walker  Distance Ambulated (ft) 200 ft  Activity Response Tolerated well  $Mobility charge 1 Mobility   Pre-Mobility: 73 HR, BP, 98% on 2LO2 SpO2 During Mobility: 89-90% on RA SpO2 Post-Mobility: 90% on RA SpO2  Pt received in bed and agreeable. Able to maintain SpO2 88-93% on RA. Encouraged pursed lip breathing. Return to bed on RA. RN agreeable to leave pt on RA. Left with call bell in reach.   Hildred Alamin Mobility Specialist

## 2022-04-10 NOTE — Progress Notes (Signed)
Per pt, she vomited large amount of dark blood in toilet. Pt flushed before staff able to see. Pt endorsed that fullness in her belly was relieved.   [place] PA paged.

## 2022-04-10 NOTE — Progress Notes (Addendum)
      [address]       [place] [address]             [phone]      2 Days Post-Op Procedure(s) (LRB): XI ROBOTIC ASSISTED THORASCOPY-RIGHT UPPER LOBECTOMY (Right) NODE DISSECTION (Right) INTERCOSTAL NERVE BLOCK (Right) Subjective: Awake and alert, progressing with mobility. Pain controlled.  No BM since before surgery.  Remains on 2Lnc O2  Objective: Vital signs in last 24 hours: Temp:  [97.8 F (36.6 C)] 97.8 F (36.6 C) (07/13 0339) Pulse Rate:  [57-90] 71 (07/13 0300) Cardiac Rhythm: Normal sinus rhythm (07/13 0709) Resp:  [16-21] 19 (07/13 0300) BP: (108-130)/(59-85) 123/85 (07/13 0339) SpO2:  [95 %-100 %] 98 % (07/13 0300)     Intake/Output from previous day: 07/12 0701 - 07/13 0700 In: 480 [P.O.:480] Out: 200 [Chest Tube:200] Intake/Output this shift: No intake/output data recorded.  General appearance: alert, cooperative, and no distress Neurologic: intact Heart: RRR, monitor showing NSR Lungs: Clear and full breath sounds on left, coarse, diminished on the right. Active air leak with deep breathing. CT drainage 245ml past 24 hours.  Wound: Port incisions are intact and dry.   Lab Results: Recent Labs    04/10/22 0230  WBC 13.3*  HGB 12.4  HCT 36.4  PLT 322   BMET:  Recent Labs    04/09/22 0428 04/10/22 0230  NA 129* 129*  K 4.5 4.4  CL 102 97*  CO2 20* 20*  GLUCOSE 146* 158*  BUN 12 26*  CREATININE 0.84 1.35*  CALCIUM 8.4* 8.9     PT/INR: No results for input(s): "LABPROT", "INR" in the last 72 hours. ABG    Component Value Date/Time   PHART 7.48 (H) 04/08/2022 1032   HCO3 26.8 04/08/2022 1032   O2SAT 99.5 04/08/2022 1032   CBG (last 3)  No results for input(s): "GLUCAP" in the last 72 hours.  Assessment/Plan: S/P Procedure(s) (LRB): XI ROBOTIC ASSISTED THORASCOPY-RIGHT UPPER LOBECTOMY (Right) NODE DISSECTION (Right) INTERCOSTAL NERVE BLOCK (Right)  -POD2 robotic-assisted RUL. Stable respiratory status  and pain controlled. Air leak has slowed. Has expected right apical space on CXR. Leave the CT to water seal. Continue to work on mobility.   -History of HTN- BP controlled since surgery. Continue her Toprol and HCTZ.   -Acute renal insufficiency-Creat 0.84-->>1.35. Stop Toradol, monitor.  -Constipation-Miralax today, Lactulose if needed.  DVT PPX- to start daily enoxaparin today. Ambulate.    LOS: 2 days    Antony Odea, Hershal Coria [phone] 04/10/2022  Air leak with cough pneumoT IS ambulation  Jalicia Roszak O Miguelangel Korn

## 2022-04-10 NOTE — Progress Notes (Signed)
13:55 Called to see patient regarding "vomiting with blood" earlier this morning. Pt reports emesis with brown clots after taking the Miralax this morning. Had immediate relief of abdominal discomfort initially but a feeling of indigestion has returned. No prior h/o PUD.   VS are stable Abd is soft with vague diffuse discomfort with palpation.   I/P:  May have old blood in her stomach from ET intubation 2 days ago vs new upper GI bleed from gastritis or gastric ulcer.  Will give IV Protonix q12 hours and add IV Reglan for 24 hours.  Monitor lab and patient's symptoms.   Macarthur Critchley, PA-C

## 2022-04-11 ENCOUNTER — Inpatient Hospital Stay (HOSPITAL_COMMUNITY): Payer: Medicare Other

## 2022-04-11 LAB — BASIC METABOLIC PANEL
Anion gap: 10 (ref 5–15)
BUN: 13 mg/dL (ref 8–23)
CO2: 23 mmol/L (ref 22–32)
Calcium: 8.5 mg/dL — ABNORMAL LOW (ref 8.9–10.3)
Chloride: 99 mmol/L (ref 98–111)
Creatinine, Ser: 0.63 mg/dL (ref 0.44–1.00)
GFR, Estimated: >60 mL/min (ref >60)
Glucose, Bld: 121 mg/dL — ABNORMAL HIGH (ref 70–99)
Potassium: 3.7 mmol/L (ref 3.5–5.1)
Sodium: 132 mmol/L — ABNORMAL LOW (ref 135–145)

## 2022-04-11 MED ORDER — PANTOPRAZOLE SODIUM 40 MG PO TBEC
40.0000 mg | DELAYED_RELEASE_TABLET | Freq: Every day | ORAL | Status: DC
  Administered 2022-04-11 – 2022-04-12 (×2): 40 mg via ORAL
  Filled 2022-04-11 (×2): qty 1

## 2022-04-11 MED ORDER — ENOXAPARIN SODIUM 40 MG/0.4ML IJ SOSY
40.0000 mg | PREFILLED_SYRINGE | Freq: Every day | INTRAMUSCULAR | Status: DC
  Administered 2022-04-12: 40 mg via SUBCUTANEOUS
  Filled 2022-04-11: qty 0.4

## 2022-04-11 NOTE — Progress Notes (Signed)
Mobility Specialist Progress Note    04/11/22 1559  Mobility  Activity Ambulated with assistance in hallway  Level of Assistance Standby assist, set-up cues, supervision of patient - no hands on  Assistive Device Front wheel walker  Distance Ambulated (ft) 360 ft  Activity Response Tolerated well  $Mobility charge 1 Mobility   Pre-Mobility: 97 HR Post-Mobility: 100 HR  Pt received in bed and agreeable. No complaints on walk. Returned to bed with call bell in reach.    Hildred Alamin Mobility Specialist

## 2022-04-11 NOTE — Plan of Care (Signed)

## 2022-04-11 NOTE — TOC Progression Note (Signed)
Transition of Care [facility]) - Progression Note    Patient Details  Name: Erica Greene MRN: [id number] Date of Birth: 1946-09-28  Transition of Care [facility]) CM/SW [place], RN Phone Number:[phone]  04/11/2022, 9:48 AM  Clinical Narrative:    TOC consulted fo DME needs. Orders have been entered for rolling walker. Rolling walker has been ordered per [employer]. To be delivered to the room        Expected Discharge Plan and Services                                                 Social Determinants of Health (SDOH) Interventions    Readmission Risk Interventions     No data to display

## 2022-04-11 NOTE — Progress Notes (Signed)
      [address]       [place] [address]             [phone]      3 Days Post-Op Procedure(s) (LRB): XI ROBOTIC ASSISTED THORASCOPY-RIGHT UPPER LOBECTOMY (Right) NODE DISSECTION (Right) INTERCOSTAL NERVE BLOCK (Right) Subjective:  Sitting up finishing breakfast. Says she feels much better today, BM yesterday. No further nausea.   Objective: Vital signs in last 24 hours: Temp:  [97.3 F (36.3 C)-98.4 F (36.9 C)] 98.1 F (36.7 C) (07/14 0331) Pulse Rate:  [70-90] 90 (07/14 0300) Cardiac Rhythm: Normal sinus rhythm (07/13 1916) Resp:  [15-20] 18 (07/14 0300) BP: (132-151)/(73-80) 151/79 (07/14 0331) SpO2:  [90 %-100 %] 94 % (07/14 0300)     Intake/Output from previous day: 07/13 0701 - 07/14 0700 In: -  Out: 104 [Urine:3; Stool:1; Chest Tube:100] Intake/Output this shift: No intake/output data recorded.  General appearance: alert, cooperative, and no distress Neurologic: intact Heart: RRR, monitor showing NSR Lungs: breath sounds clear bilat,  diminished on the right. Active air leak with deep breathing and cough. CT drainage 128ml past 24 hours.  Wound: Port incisions are intact and dry.   Lab Results: Recent Labs    04/10/22 0230  WBC 13.3*  HGB 12.4  HCT 36.4  PLT 322    BMET:  Recent Labs    04/10/22 0230 04/11/22 0122  NA 129* 132*  K 4.4 3.7  CL 97* 99  CO2 20* 23  GLUCOSE 158* 121*  BUN 26* 13  CREATININE 1.35* 0.63  CALCIUM 8.9 8.5*     PT/INR: No results for input(s): "LABPROT", "INR" in the last 72 hours. ABG    Component Value Date/Time   PHART 7.48 (H) 04/08/2022 1032   HCO3 26.8 04/08/2022 1032   O2SAT 99.5 04/08/2022 1032   CBG (last 3)  No results for input(s): "GLUCAP" in the last 72 hours.  Assessment/Plan: S/P Procedure(s) (LRB): XI ROBOTIC ASSISTED THORASCOPY-RIGHT UPPER LOBECTOMY (Right) NODE DISSECTION (Right) INTERCOSTAL NERVE BLOCK (Right)  -POD3 robotic-assisted RUL. Stable respiratory  status and pain controlled. Air leak continues to slow but but has not resolved. CXR showing slight increase in apical PTX.  Leave the CT to water seal. Continue to work on mobility.   -History of HTN- BP controlled since surgery. Continue her Toprol and HCTZ. Sinus tachycardia this am, Toprol not given yet.  -Acute renal insufficiency- resolved. Creat 0.84-->>1.35-->0.63.   -Constipation-resolved  DVT PPX-  enoxaparin today. Ambulate.    LOS: 3 days    Antony Odea, PA-C [phone] 04/11/2022

## 2022-04-11 NOTE — Care Management Important Message (Signed)
Important Message  Patient Details  Name: Erica Greene MRN: [id number] Date of Birth: Feb 05, 1946   Medicare Important Message Given:  Yes     Orbie Pyo 04/11/2022, 3:16 PM

## 2022-04-12 ENCOUNTER — Inpatient Hospital Stay (HOSPITAL_COMMUNITY): Payer: Medicare Other

## 2022-04-12 LAB — BASIC METABOLIC PANEL
Anion gap: 8 (ref 5–15)
BUN: 10 mg/dL (ref 8–23)
CO2: 27 mmol/L (ref 22–32)
Calcium: 9.1 mg/dL (ref 8.9–10.3)
Chloride: 102 mmol/L (ref 98–111)
Creatinine, Ser: 0.82 mg/dL (ref 0.44–1.00)
GFR, Estimated: >60 mL/min (ref >60)
Glucose, Bld: 119 mg/dL — ABNORMAL HIGH (ref 70–99)
Potassium: 4.6 mmol/L (ref 3.5–5.1)
Sodium: 137 mmol/L (ref 135–145)

## 2022-04-12 MED ORDER — CARVEDILOL 3.125 MG PO TABS
3.1250 mg | ORAL_TABLET | Freq: Two times a day (BID) | ORAL | Status: DC
  Administered 2022-04-12: 3.125 mg via ORAL
  Filled 2022-04-12: qty 1

## 2022-04-12 MED ORDER — TRAMADOL HCL 50 MG PO TABS
50.0000 mg | ORAL_TABLET | Freq: Four times a day (QID) | ORAL | 0 refills | Status: AC | PRN
Start: 2022-04-12 — End: 2022-04-19

## 2022-04-12 MED ORDER — HYDROCHLOROTHIAZIDE 50 MG PO TABS: 50.0000 mg | ORAL_TABLET | Freq: Every day | ORAL | 1 refills | Status: DC

## 2022-04-12 MED ORDER — ACETAMINOPHEN 325 MG PO TABS: 650.0000 mg | ORAL_TABLET | ORAL | Status: AC | PRN

## 2022-04-12 MED ORDER — HYDROCHLOROTHIAZIDE 25 MG PO TABS
50.0000 mg | ORAL_TABLET | Freq: Every day | ORAL | Status: DC
  Administered 2022-04-12: 50 mg via ORAL
  Filled 2022-04-12 (×2): qty 2

## 2022-04-12 MED ORDER — CARVEDILOL 3.125 MG PO TABS: 3.1250 mg | ORAL_TABLET | Freq: Two times a day (BID) | ORAL | 1 refills | Status: DC

## 2022-04-12 NOTE — Progress Notes (Addendum)
[address]       [place] [address]             [phone]      4 Days Post-Op Procedure(s) (LRB): XI ROBOTIC ASSISTED THORASCOPY-RIGHT UPPER LOBECTOMY (Right) NODE DISSECTION (Right) INTERCOSTAL NERVE BLOCK (Right) Subjective: Feeling better overall  Objective: Vital signs in last 24 hours: Temp:  [97.6 F (36.4 C)-98.4 F (36.9 C)] 98.4 F (36.9 C) (07/15 0732) Pulse Rate:  [91-119] 119 (07/15 0732) Cardiac Rhythm: Sinus tachycardia (07/15 0700) Resp:  [16-22] 16 (07/15 0732) BP: (122-157)/(65-93) 143/69 (07/15 0732) SpO2:  [98 %] 98 % (07/15 0732)  Hemodynamic parameters for last 24 hours:    Intake/Output from previous day: 07/14 0701 - 07/15 0700 In: 360 [P.O.:360] Out: 100 [Chest Tube:100] Intake/Output this shift: No intake/output data recorded.  General appearance: alert, cooperative, and no distress Heart: regular rate and rhythm Lungs: mildly dim in bases Abdomen: benign Extremities: no edema or calf tenderness Wound: incis healing well  Lab Results: Recent Labs    04/10/22 0230  WBC 13.3*  HGB 12.4  HCT 36.4  PLT 322   BMET:  Recent Labs    04/11/22 0122 04/12/22 0020  NA 132* 137  K 3.7 4.6  CL 99 102  CO2 23 27  GLUCOSE 121* 119*  BUN 13 10  CREATININE 0.63 0.82  CALCIUM 8.5* 9.1    PT/INR: No results for input(s): "LABPROT", "INR" in the last 72 hours. ABG    Component Value Date/Time   PHART 7.48 (H) 04/08/2022 1032   HCO3 26.8 04/08/2022 1032   O2SAT 99.5 04/08/2022 1032   CBG (last 3)  No results for input(s): "GLUCAP" in the last 72 hours.  Meds Scheduled Meds:  acetaminophen  1,000 mg Oral Q6H   Or   acetaminophen (TYLENOL) oral liquid 160 mg/5 mL  1,000 mg Oral Q6H   bisacodyl  10 mg Oral Daily   enoxaparin (LOVENOX) injection  40 mg Subcutaneous Daily   hydrochlorothiazide  12.5 mg Oral Daily   metoprolol succinate  25 mg Oral Daily   pantoprazole  40 mg Oral Daily   senna-docusate  1  tablet Oral QHS   Continuous Infusions: PRN Meds:.lactulose, morphine injection, ondansetron (ZOFRAN) IV, oxyCODONE, polyethylene glycol, traMADol  Xrays DG CHEST PORT 1 VIEW  Result Date: 04/12/2022 CLINICAL DATA:  Follow-up pneumothorax EXAM: PORTABLE CHEST 1 VIEW COMPARISON:  04/11/2022 FINDINGS: Cardiomediastinal silhouette is unchanged. RIGHT thoracostomy tube is again noted with decreased size of RIGHT apical pneumothorax, now 15-20%. RIGHT surgical changes are again noted. No pleural effusion or LEFT pneumothorax noted. IMPRESSION: Decreased RIGHT apical pneumothorax, now 15-20%. Electronically Signed   By: Margarette Canada M.D.   On: 04/12/2022 08:10   DG CHEST PORT 1 VIEW  Result Date: 04/11/2022 CLINICAL DATA:  Pneumothorax. EXAM: PORTABLE CHEST 1 VIEW COMPARISON:  04/10/2022. FINDINGS: Similar size of a right moderate pneumothorax. Similar position of right chest tube. Lungs are clear. Postsurgical changes of right lobectomy. Unchanged cardiomediastinal silhouette. No acute osseous abnormality. IMPRESSION: Similar size of a right moderate pneumothorax with chest tube in place. Electronically Signed   By: Margaretha Sheffield M.D.   On: 04/11/2022 08:15    Assessment/Plan: S/P Procedure(s) (LRB): XI ROBOTIC ASSISTED THORASCOPY-RIGHT UPPER LOBECTOMY (Right) NODE DISSECTION (Right) INTERCOSTAL NERVE BLOCK (Right) POD#4  1 afeb, S BP 120's-150's , mostly elevated, sinus rhythm/tachy( 91-119 range), increase HCTZ dose, will add low dose coreg and d/c metoprolol,  she  is allergic to norvasc and ACE-I. Hypertension has been an issue for a while 2 sats good on RA 3 CT 100 cc, + tidaling but no definite air leak, cont to H2O seal v put on suction for short term? 4 CXR decreased right apical pntx, may be a space 5 normal renal fxn 6 cont to mobilize and work on pulm hygiene 7 lovenox for DVT PPx     LOS: 4 days    John Giovanni PA-C Pager [phone] 04/12/2022   Agree with above Will  clamp chest tube and repeat CXR If stable, home tomorrow  Lajuana Matte

## 2022-04-12 NOTE — TOC Transition Note (Signed)
Transition of Care [facility]) - CM/SW Discharge Note   Patient Details  Name: Erica Greene MRN: [id number] Date of Birth: 03-28-1946  Transition of Care [facility]) CM/SW Contact:  Konrad Penta, RN Phone Number: [phone] 04/12/2022, 4:29 PM   Clinical Narrative:       Final next level of care: Home w Hospice Care Barriers to Discharge: No Barriers Identified   Patient Goals and CMS Choice Patient states their goals for this hospitalization and ongoing recovery are:: return home      Discharge Placement                       Discharge Plan and Services                DME Arranged: Walker rolling DME Agency: [employer] Date DME Agency Contacted: 04/11/22 Time DME Agency Contacted: [phone] Representative spoke with at DME Agency: [place] ([place]) Interventions     Readmission Risk Interventions     No data to display

## 2022-04-18 ENCOUNTER — Other Ambulatory Visit: Payer: Self-pay | Admitting: *Deleted

## 2022-04-18 NOTE — Progress Notes (Signed)
The proposed treatment discussed in conference is for discussion purpose only and is not a binding recommendation.  The patients have not been physically examined, or presented with their treatment options.  Therefore, final treatment plans cannot be decided.  

## 2022-04-24 NOTE — Progress Notes (Signed)
[address]       [place] [address]             [phone]        Jeraldin Gruhn [place] Medical Record #[id number] Date of Birth: 07/15/1946  Referring: Garner Nash, DO Primary Care: Cipriano Mile, NP Primary Cardiologist:None  Reason for visit:   follow-up  History of Present Illness:     76yo female presents for her 1st follow-up appointment after undergoing a RULectomy.  She states that she continues to have some right-sided soreness.  She has not been taking her pain medication and only takes Tylenol once a day.  Physical Exam: BP (!) 145/82   Pulse (!) 106   Resp 20   Ht 5' (1.524 m)   Wt 133 lb (60.3 kg)   SpO2 98%   BMI 25.97 kg/m   Alert NAD Incision clean, stitch removed.   Abdomen, ND No peripheral edema   Diagnostic Studies & Laboratory data: Path: FINAL MICROSCOPIC DIAGNOSIS:    A. LUNG, RIGHT UPPER LOBE, LOBECTOMY:  - Invasive lung adenocarcinoma, acinar predominant, 1.2 cm  - Visceral pleura is not involved  - Resection margin is negative for carcinoma  - Four benign lymph nodes (0/4)  - See oncology table   B. LYMPH NODE, LEVEL 9, EXCISION:  - Lymph node, negative for carcinoma (0/1)   C. LYMPH NODE, HILAR, EXCISION:  - Lymph node, negative for carcinoma (0/1)   D. LYMPH NODE, HILAR #2, EXCISION:  - Lymph node, negative for carcinoma (0/1)   E. LYMPH NODE, HILAR #3, EXCISION:  - Lymph node, negative for carcinoma (0/1)   F. LYMPH NODE, LEVEL 7, EXCISION:  - Lymph node, negative for carcinoma (0/1)   G. LYMPH NODE, LEVEL 7 #2, EXCISION:  - Lymph node, negative for carcinoma (0/1)   H. LYMPH NODE, LEVEL 7 #3, EXCISION:  - Lymph node, negative for carcinoma (0/1)       ONCOLOGY TABLE:   LUNG: Resection   Synchronous Tumors: Not applicable  Total Number of Primary Tumors: 1  Procedure: Lobectomy, lung  Specimen Laterality: Right  Tumor Focality: Unifocal  Tumor Site: Upper lobe  Tumor Size:  1.2 cm  Histologic Type: Adenocarcinoma, acinar predominant  Visceral Pleura Invasion: Not identified  Direct Invasion of Adjacent Structures: No adjacent structures present  Lymphovascular Invasion: Not identified  Margins: All margins negative for invasive carcinoma       Closest Margin(s) to Invasive Carcinoma: Bronchovascular margin       Margin(s) Involved by Invasive Carcinoma: Not applicable        Margin Status for Non-Invasive Tumor: Not applicable  Treatment Effect: No known presurgical therapy  Regional Lymph Nodes:       Number of Lymph Nodes Involved: 0                            Nodal Sites with Tumor: Not applicable       Number of Lymph Nodes Examined: 11                       Nodal Sites Examined: Levels 7, 9 and 10  Distant Metastasis:       Distant Site(s) Involved: Not applicable  Pathologic Stage Classification (pTNM, AJCC 8th Edition): pT1b, pN0      Assessment / Plan:   76yo female s/p RULectomy for T1bN0M0 Stage I  adenocarcinoma.  She will follow-up with [place] in 1 months.  She will also be referred to medical oncology to establish surveillance care.  I informed her to stagger her ibuprofen and Tylenol, and take her tramadol for significant pain.   Lajuana Matte 04/25/2022 9:36 PM

## 2022-04-25 ENCOUNTER — Ambulatory Visit (INDEPENDENT_AMBULATORY_CARE_PROVIDER_SITE_OTHER): Payer: Self-pay | Admitting: Thoracic Surgery (Cardiothoracic Vascular Surgery)

## 2022-04-25 VITALS — BP 145/82 | HR 106 | Resp 20 | Ht 60.0 in | Wt 133.0 lb

## 2022-04-25 DIAGNOSIS — Z902 Acquired absence of lung [part of]: Secondary | ICD-10-CM

## 2022-04-28 ENCOUNTER — Telehealth: Payer: Self-pay | Admitting: Internal Medicine

## 2022-04-28 NOTE — Telephone Encounter (Signed)
Scheduled appt per 7/28 referral. Pt is aware of appt date and time. Pt is aware to arrive 15 mins prior to appt time and to bring and updated insurance card. Pt is aware of appt location.

## 2022-05-12 ENCOUNTER — Other Ambulatory Visit: Payer: Self-pay | Admitting: Medical Oncology

## 2022-05-12 ENCOUNTER — Other Ambulatory Visit: Payer: Self-pay

## 2022-05-12 DIAGNOSIS — R918 Other nonspecific abnormal finding of lung field: Secondary | ICD-10-CM

## 2022-05-12 DIAGNOSIS — C3490 Malignant neoplasm of unspecified part of unspecified bronchus or lung: Secondary | ICD-10-CM

## 2022-05-13 ENCOUNTER — Other Ambulatory Visit: Payer: Self-pay

## 2022-05-13 ENCOUNTER — Inpatient Hospital Stay: Payer: Medicare Other

## 2022-05-13 ENCOUNTER — Inpatient Hospital Stay: Payer: Medicare Other | Attending: Internal Medicine | Admitting: Internal Medicine

## 2022-05-13 ENCOUNTER — Encounter: Payer: Self-pay | Admitting: Internal Medicine

## 2022-05-13 VITALS — BP 152/74 | HR 102 | Temp 98.4°F | Resp 15 | Ht 60.0 in | Wt 134.2 lb

## 2022-05-13 DIAGNOSIS — C3411 Malignant neoplasm of upper lobe, right bronchus or lung: Secondary | ICD-10-CM | POA: Diagnosis present

## 2022-05-13 DIAGNOSIS — Z882 Allergy status to sulfonamides status: Secondary | ICD-10-CM | POA: Diagnosis not present

## 2022-05-13 DIAGNOSIS — Z8249 Family history of ischemic heart disease and other diseases of the circulatory system: Secondary | ICD-10-CM | POA: Insufficient documentation

## 2022-05-13 DIAGNOSIS — R059 Cough, unspecified: Secondary | ICD-10-CM | POA: Insufficient documentation

## 2022-05-13 DIAGNOSIS — R5383 Other fatigue: Secondary | ICD-10-CM | POA: Insufficient documentation

## 2022-05-13 DIAGNOSIS — E785 Hyperlipidemia, unspecified: Secondary | ICD-10-CM | POA: Insufficient documentation

## 2022-05-13 DIAGNOSIS — Z841 Family history of disorders of kidney and ureter: Secondary | ICD-10-CM | POA: Diagnosis not present

## 2022-05-13 DIAGNOSIS — R634 Abnormal weight loss: Secondary | ICD-10-CM | POA: Diagnosis not present

## 2022-05-13 DIAGNOSIS — Z8049 Family history of malignant neoplasm of other genital organs: Secondary | ICD-10-CM | POA: Insufficient documentation

## 2022-05-13 DIAGNOSIS — D649 Anemia, unspecified: Secondary | ICD-10-CM | POA: Insufficient documentation

## 2022-05-13 DIAGNOSIS — R0609 Other forms of dyspnea: Secondary | ICD-10-CM | POA: Diagnosis not present

## 2022-05-13 DIAGNOSIS — Z880 Allergy status to penicillin: Secondary | ICD-10-CM | POA: Diagnosis not present

## 2022-05-13 DIAGNOSIS — R0602 Shortness of breath: Secondary | ICD-10-CM | POA: Insufficient documentation

## 2022-05-13 DIAGNOSIS — I10 Essential (primary) hypertension: Secondary | ICD-10-CM | POA: Insufficient documentation

## 2022-05-13 DIAGNOSIS — Z888 Allergy status to other drugs, medicaments and biological substances status: Secondary | ICD-10-CM | POA: Diagnosis not present

## 2022-05-13 DIAGNOSIS — Z801 Family history of malignant neoplasm of trachea, bronchus and lung: Secondary | ICD-10-CM | POA: Diagnosis not present

## 2022-05-13 DIAGNOSIS — C3490 Malignant neoplasm of unspecified part of unspecified bronchus or lung: Secondary | ICD-10-CM

## 2022-05-13 DIAGNOSIS — Z79899 Other long term (current) drug therapy: Secondary | ICD-10-CM | POA: Insufficient documentation

## 2022-05-13 DIAGNOSIS — R0600 Dyspnea, unspecified: Secondary | ICD-10-CM

## 2022-05-13 DIAGNOSIS — Z803 Family history of malignant neoplasm of breast: Secondary | ICD-10-CM | POA: Diagnosis not present

## 2022-05-13 DIAGNOSIS — Z809 Family history of malignant neoplasm, unspecified: Secondary | ICD-10-CM

## 2022-05-13 LAB — CBC WITH DIFFERENTIAL (CANCER CENTER ONLY)
Abs Immature Granulocytes: 0.09 10*3/uL — ABNORMAL HIGH (ref 0.00–0.07)
Basophils Absolute: 0.1 10*3/uL (ref 0.0–0.1)
Basophils Relative: 1 %
Eosinophils Absolute: 0.2 10*3/uL (ref 0.0–0.5)
Eosinophils Relative: 2 %
HCT: 37.1 % (ref 36.0–46.0)
Hemoglobin: 12.4 g/dL (ref 12.0–15.0)
Immature Granulocytes: 1 %
Lymphocytes Relative: 26 %
Lymphs Abs: 2.8 10*3/uL (ref 0.7–4.0)
MCH: 28.6 pg (ref 26.0–34.0)
MCHC: 33.4 g/dL (ref 30.0–36.0)
MCV: 85.7 fL (ref 80.0–100.0)
Monocytes Absolute: 0.8 10*3/uL (ref 0.1–1.0)
Monocytes Relative: 7 %
Neutro Abs: 6.8 10*3/uL (ref 1.7–7.7)
Neutrophils Relative %: 63 %
Platelet Count: 406 10*3/uL — ABNORMAL HIGH (ref 150–400)
RBC: 4.33 MIL/uL (ref 3.87–5.11)
RDW: 13.7 % (ref 11.5–15.5)
WBC Count: 10.8 10*3/uL — ABNORMAL HIGH (ref 4.0–10.5)
nRBC: 0 % (ref 0.0–0.2)

## 2022-05-13 LAB — CMP (CANCER CENTER ONLY)
ALT: 10 U/L (ref 0–44)
AST: 14 U/L — ABNORMAL LOW (ref 15–41)
Albumin: 4 g/dL (ref 3.5–5.0)
Alkaline Phosphatase: 52 U/L (ref 38–126)
Anion gap: 8 (ref 5–15)
BUN: 16 mg/dL (ref 8–23)
CO2: 32 mmol/L (ref 22–32)
Calcium: 10.1 mg/dL (ref 8.9–10.3)
Chloride: 100 mmol/L (ref 98–111)
Creatinine: 0.69 mg/dL (ref 0.44–1.00)
GFR, Estimated: >60 mL/min (ref >60)
Glucose, Bld: 115 mg/dL — ABNORMAL HIGH (ref 70–99)
Potassium: 3.6 mmol/L (ref 3.5–5.1)
Sodium: 140 mmol/L (ref 135–145)
Total Bilirubin: 0.5 mg/dL (ref 0.3–1.2)
Total Protein: 7.4 g/dL (ref 6.5–8.1)

## 2022-05-13 NOTE — Progress Notes (Signed)
[place] Telephone:([phone]) [phone]   [phone]  CONSULT NOTE  REFERRING PHYSICIAN: Dr. Melodie Bouillon  REASON FOR CONSULTATION:  76 years old African-American female recently diagnosed with lung cancer.  HPI Erica Greene is a 76 y.o. female never smoker with past medical history significant for hypertension, anemia, pneumonia as well as dyslipidemia.  The patient mentioned that for the last 5 years she was told by her pulmonologist Dr. Melvyn Novas that she had a nodule in the right upper part of her lung.  This was monitored closely for several years with repeat imaging studies on annual basis with minimal changes.  The last scan on Feb 03, 2022 showed increase in the size of the right upper lobe pulmonary nodule measuring 1.4 x 1.2 cm.  The patient had a PET scan on 03/05/2022 and it showed low FDG activity in the right upper lobe lung nodule but is still suspicious for lobe grade adenocarcinoma.  The patient was referred to Dr. Kipp Brood and on April 08, 2022 she underwent right upper lobectomy with mediastinal lymph node sampling. The final pathology [id number]) showed invasive lung adenocarcinoma, acinar predominant, 1.2 cm with negative resection margin and no visceral pleural or lymphovascular invasion.  The dissected lymph nodes were negative for malignancy. Dr. Kipp Brood kindly referred the patient to me today for evaluation and recommendation regarding her condition and close monitoring. When seen today she is feeling fine with no concerning complaints except for dry cough and some soreness on the right side of the chest with shortness of breath on exertion.  She denied having any hemoptysis.  She lost around 10 pounds in the last 3 months.  She has no headache or visual changes.  She has no nausea, vomiting, diarrhea or constipation. Family history significant for mother with breast cancer and uterine cancer.  Father had lung cancer and many paternal uncles had  lung cancer. The patient is married and has 2 daughters.  She used to do clerical work for [employer]  She has no history of smoking, alcohol or drug abuse.  She was accompanied today by her husband Erica Greene.  HPI  Past Medical History:  Diagnosis Date   10 year risk of MI or stroke 7.5% or greater 08/17/2020   16.2%   Anemia    during pregnancies   Cancer ([place])    pre-cancerous skin lesions on face. surgically removed.   Family history of breast cancer    Family history of uterine cancer    Hypertension    Pneumonia    as a child    Past Surgical History:  Procedure Laterality Date   ABDOMINAL HYSTERECTOMY  1997   TOTAL ABDOMINAL HYSTERECTOMY   BREAST SURGERY     BREAST REDUCTION   COLONOSCOPY     11 yrs ago in [place]- was Normal per pt.   cyst removal foot     CYST REMOVAL HAND     INTERCOSTAL NERVE BLOCK Right 04/08/2022   Procedure: INTERCOSTAL NERVE BLOCK;  Surgeon: Erica Matte, MD;  Location: [facility];  Service: Thoracic;  Laterality: Right;   NODE DISSECTION Right 04/08/2022   Procedure: NODE DISSECTION;  Surgeon: Erica Matte, MD;  Location: [facility];  Service: Thoracic;  Laterality: Right;   WISDOM TOOTH EXTRACTION      Family History  Problem Relation Age of Onset   Breast cancer Mother 8   Uterine cancer Mother 34   Cancer Father        LUNG  AND THROAT - SMOKER   Cancer Paternal Uncle        LUNG    Cancer Paternal Uncle        LUNG   Cancer Paternal Uncle        LUNG   Cancer Paternal Uncle        LUNG   Kidney failure Brother    Cervical cancer Maternal Aunt        dx in her 23s   Congestive Heart Failure Maternal Aunt 80   Uterine cancer Paternal Aunt        dx >50   Kidney failure Maternal Grandfather    Cancer Paternal Grandmother        NOS   Cancer Paternal Grandfather        lung and throat - smoker   Colon cancer Neg Hx    Colon polyps Neg Hx     Social History Social History   Tobacco Use   Smoking status: Never    Smokeless tobacco: Never  Vaping Use   Vaping Use: Never used  Substance Use Topics   Alcohol use: Yes    Alcohol/week: 0.0 standard drinks of alcohol    Comment: OCC   Drug use: No    Allergies  Allergen Reactions   Penicillins Anaphylaxis   Amlodipine Swelling    Gums swell   Lisinopril Swelling    Facial swelling   Sulfa Antibiotics Itching   Latex Rash    Current Outpatient Medications  Medication Sig Dispense Refill   acetaminophen (TYLENOL) 325 MG tablet Take 2 tablets (650 mg total) by mouth every 4 (four) hours as needed for mild pain or moderate pain.     carvedilol (COREG) 3.125 MG tablet Take 1 tablet (3.125 mg total) by mouth 2 (two) times daily with a meal. 60 tablet 1   Cholecalciferol (CVS VIT D 5000 HIGH-POTENCY PO) Take 5,000 Units by mouth daily.     hydrochlorothiazide (HYDRODIURIL) 50 MG tablet Take 1 tablet (50 mg total) by mouth daily. 30 tablet 1   Omega-3 Fatty Acids (FISH OIL) 1000 MG CAPS Take 1,000 mg by mouth daily.     Probiotic Product (PROBIOTIC DAILY PO) Take 1 Capful by mouth daily.     Turmeric 500 MG CAPS Take 500 mg by mouth daily.     No current facility-administered medications for this visit.    Review of Systems  Constitutional: positive for fatigue and weight loss Eyes: negative Ears, nose, mouth, throat, and face: negative Respiratory: positive for cough, dyspnea on exertion, and pleurisy/chest pain Cardiovascular: negative Gastrointestinal: negative Genitourinary:negative Integument/breast: negative Hematologic/lymphatic: negative Musculoskeletal:negative Neurological: negative Behavioral/Psych: negative Endocrine: negative Allergic/Immunologic: negative  Physical Exam  GHW:EXHBZ, healthy, no distress, well nourished, and well developed SKIN: skin color, texture, turgor are normal, no rashes or significant lesions HEAD: Normocephalic, No masses, lesions, tenderness or abnormalities EYES: normal, PERRLA, Conjunctiva  are pink and non-injected EARS: External ears normal, Canals clear OROPHARYNX:no exudate, no erythema, and lips, buccal mucosa, and tongue normal  NECK: supple, no adenopathy, no JVD LYMPH:  no palpable lymphadenopathy BREAST:not examined LUNGS: clear to auscultation , and palpation HEART: regular rate & rhythm, no murmurs, and no gallops ABDOMEN:abdomen soft, non-tender, normal bowel sounds, and no masses or organomegaly BACK: Back symmetric, no curvature., No CVA tenderness EXTREMITIES:no joint deformities, effusion, or inflammation, no edema  NEURO: alert & oriented x 3 with fluent speech, no focal motor/sensory deficits  PERFORMANCE STATUS: ECOG 1  LABORATORY DATA: Lab Results  Component Value Date   WBC 10.8 (H) 05/13/2022   HGB 12.4 05/13/2022   HCT 37.1 05/13/2022   MCV 85.7 05/13/2022   PLT 406 (H) 05/13/2022      Chemistry      Component Value Date/Time   NA 137 04/12/2022 0020   NA 143 02/05/2021 0958   K 4.6 04/12/2022 0020   CL 102 04/12/2022 0020   CO2 27 04/12/2022 0020   BUN 10 04/12/2022 0020   BUN 15 02/05/2021 0958   CREATININE 0.82 04/12/2022 0020   GLU 78 08/03/2018 0834      Component Value Date/Time   CALCIUM 9.1 04/12/2022 0020   ALKPHOS 38 04/10/2022 0230   AST 33 04/10/2022 0230   ALT 18 04/10/2022 0230   BILITOT 1.0 04/10/2022 0230   BILITOT 0.5 02/05/2021 0958       RADIOGRAPHIC STUDIES: No results found.  ASSESSMENT: This is a very pleasant 76 years old and never smoker African-American female recently diagnosed with stage Ia (T1b, N0, M0) non-small cell lung cancer, adenocarcinoma presented with right upper lobe lung nodule status post right upper lobectomy with lymph node sampling under the care of Dr. Kipp Brood on 04/08/2022.  The tumor size was 1.2 cm with no evidence for metastasis to the lymph node or visceral pleural or lymphovascular invasion.   PLAN: I had a lengthy discussion with the patient today about her current disease  stage, prognosis and treatment options. I explained to the patient that she has incurable treatment for her condition with the surgical resection. I discussed with the patient her prognosis and explained to her that there is no survival benefit for adjuvant treatment with systemic chemotherapy or radiation for patient with a stage Ia non-small cell lung cancer and the current standard of care is observation. I recommended for the patient to have repeat CT scan of the chest in 6 months for restaging of her disease. I will see her back for follow-up visit at that time. The patient was advised to call immediately if she has any concerning symptoms in the interval. The patient voices understanding of current disease status and treatment options and is in agreement with the current care plan.  All questions were answered. The patient knows to call the clinic with any problems, questions or concerns. We can certainly see the patient much sooner if necessary.  Thank you so much for allowing me to participate in the care of Erica Greene. I will continue to follow up the patient with you and assist in her care.  The total time spent in the appointment was 60 minutes.  Disclaimer: This note was dictated with voice recognition software. Similar sounding words can inadvertently be transcribed and may not be corrected upon review.   Eilleen Kempf May 13, 2022, 2:26 PM

## 2022-06-01 ENCOUNTER — Other Ambulatory Visit: Payer: Self-pay | Admitting: Surgical

## 2022-06-05 ENCOUNTER — Ambulatory Visit
Admission: RE | Admit: 2022-06-05 | Discharge: 2022-06-05 | Disposition: A | Discharge status: Home / Self Care | Payer: Medicare Other | Source: Ambulatory Visit | Attending: Thoracic Surgery (Cardiothoracic Vascular Surgery) | Admitting: Thoracic Surgery (Cardiothoracic Vascular Surgery)

## 2022-06-05 ENCOUNTER — Other Ambulatory Visit: Payer: Self-pay | Admitting: Thoracic Surgery (Cardiothoracic Vascular Surgery)

## 2022-06-05 DIAGNOSIS — Z902 Acquired absence of lung [part of]: Secondary | ICD-10-CM

## 2022-06-06 ENCOUNTER — Ambulatory Visit (INDEPENDENT_AMBULATORY_CARE_PROVIDER_SITE_OTHER): Payer: Self-pay | Admitting: Thoracic Surgery (Cardiothoracic Vascular Surgery)

## 2022-06-06 VITALS — BP 137/80 | HR 82 | Resp 18 | Ht 60.0 in | Wt 132.0 lb

## 2022-06-06 DIAGNOSIS — Z902 Acquired absence of lung [part of]: Secondary | ICD-10-CM

## 2022-06-06 NOTE — Progress Notes (Signed)
      [address]       [place] [address]             [phone]        Ellyson Burk[facility] Medical Record #[id number] Date of Birth: Sep 02, 1946  Referring: Garner Nash, DO Primary Care: Cipriano Mile, NP Primary Cardiologist:None  Reason for visit:   follow-up  History of Present Illness:     Mrs. Helf comes in for 1 month follow-up appointment.  She complains of some reflux as well as a chronic cough.  She denies any shortness of breath.  Physical Exam: BP 137/80 (BP Location: Right Arm, Patient Position: Sitting)   Pulse 82   Resp 18   Ht 5' (1.524 m)   Wt 132 lb (59.9 kg)   SpO2 92% Comment: RA  BMI 25.78 kg/m   Alert NAD Abdomen, ND No peripheral edema   Diagnostic Studies & Laboratory data: CXR: Clear     Assessment / Plan:   76yo female s/p RULectomy for T1bN0M0 Stage I adenocarcinoma.  Overall doing well.  She is scheduled to meet with Dr. Valeta Harms in the next few weeks.  She will continue surveillance at the cancer center.  She will follow-up with [place] as needed.   Lajuana Matte 06/06/2022 12:59 PM

## 2022-06-09 ENCOUNTER — Encounter: Payer: Self-pay | Admitting: Pulmonary Disease

## 2022-06-09 ENCOUNTER — Ambulatory Visit (INDEPENDENT_AMBULATORY_CARE_PROVIDER_SITE_OTHER): Payer: Medicare Other | Admitting: Pulmonary Disease

## 2022-06-09 VITALS — BP 130/70 | HR 108 | Ht <= 58 in | Wt 131.4 lb

## 2022-06-09 DIAGNOSIS — Z8049 Family history of malignant neoplasm of other genital organs: Secondary | ICD-10-CM

## 2022-06-09 DIAGNOSIS — Z902 Acquired absence of lung [part of]: Secondary | ICD-10-CM

## 2022-06-09 DIAGNOSIS — C3411 Malignant neoplasm of upper lobe, right bronchus or lung: Secondary | ICD-10-CM | POA: Diagnosis not present

## 2022-06-09 DIAGNOSIS — Z803 Family history of malignant neoplasm of breast: Secondary | ICD-10-CM | POA: Diagnosis not present

## 2022-06-09 MED ORDER — ALBUTEROL SULFATE HFA 108 (90 BASE) MCG/ACT IN AERS: 2.0000 | INHALATION_SPRAY | Freq: Four times a day (QID) | RESPIRATORY_TRACT | 6 refills | Status: DC | PRN

## 2022-06-09 NOTE — Patient Instructions (Signed)
Thank you for visiting Dr. Valeta Harms at [facility]. Today we recommend the following:  Return if symptoms worsen or fail to improve, for follow up with Dr. Julien Nordmann for ct imaging .    Please do your part to reduce the spread of COVID-19.

## 2022-06-09 NOTE — Progress Notes (Signed)
Synopsis: Referred in June 2023 for Lung nodule by Cipriano Mile, NP  Subjective:   PATIENT ID: Erica Greene GENDER: female DOB: 1946/01/05, MRN: [id number]  Chief Complaint  Patient presents with   Follow-up    This is a 76 year old female, past medical history of hypertension, lifelong non-smoker.  Paternal uncle with lung cancer, smoker mother with breast and uterine cancer.  Patient has a right upper lobe groundglass subsolid lesion that has slowly been enlarging.  Initially discovered in April 2022.  Repeat CT scan of the chest in 1 year documents that the lesion has grown minimally in size.  Slowly been getting bigger since 2019.  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  Patient referred to talk today about potential for bronchoscopy or even consideration for surgical evaluation and removal.  OV 06/09/2022: Here today for follow-up after surgical resection.Patient was taken by Dr. Kipp Brood for right upper lobe lobectomy diagnosed with a T1b N0 M0 stage I adenocarcinoma of the lung.  Last follow-up visit in the office was on 06/06/2022, office note from Dr. Kipp Brood reviewed.  Patient has also established care with medical oncology.  Office note from 05/13/2022 reviewed.  Currently has surveillance imaging planned.  She has a repeat CT scan of the chest ordered by Dr. Earlie Server already.    Past Medical History:  Diagnosis Date   10 year risk of MI or stroke 7.5% or greater 08/17/2020   16.2%   Anemia    during pregnancies   Cancer ([place])    pre-cancerous skin lesions on face. surgically removed.   Family history of breast cancer    Family history of uterine cancer    Hypertension    Pneumonia    as a child     Family History  Problem Relation Age of Onset   Breast cancer Mother 55   Uterine cancer Mother 27   Cancer Father        LUNG AND THROAT - SMOKER   Cancer Paternal Uncle        LUNG    Cancer Paternal Uncle        LUNG   Cancer Paternal Uncle         LUNG   Cancer Paternal Uncle        LUNG   Kidney failure Brother    Cervical cancer Maternal Aunt        dx in her 29s   Congestive Heart Failure Maternal Aunt 80   Uterine cancer Paternal Aunt        dx >50   Kidney failure Maternal Grandfather    Cancer Paternal Grandmother        NOS   Cancer Paternal Grandfather        lung and throat - smoker   Colon cancer Neg Hx    Colon polyps Neg Hx      Past Surgical History:  Procedure Laterality Date   ABDOMINAL HYSTERECTOMY  1997   TOTAL ABDOMINAL HYSTERECTOMY   BREAST SURGERY     BREAST REDUCTION   COLONOSCOPY     11 yrs ago in [place]- was Normal per pt.   cyst removal foot     CYST REMOVAL HAND     INTERCOSTAL NERVE BLOCK Right 04/08/2022   Procedure: INTERCOSTAL NERVE BLOCK;  Surgeon: Lajuana Matte, MD;  Location: [facility];  Service: Thoracic;  Laterality: Right;   NODE DISSECTION Right 04/08/2022   Procedure: NODE DISSECTION;  Surgeon: Lajuana Matte, MD;  Location: [facility];  Service: Thoracic;  Laterality: Right;   WISDOM TOOTH EXTRACTION      Social History   Socioeconomic History   Marital status: Married    Spouse name: [name]   Number of children: 2   Years of education: Not on file   Highest education level: Not on file  Occupational History   Not on file  Tobacco Use   Smoking status: Never   Smokeless tobacco: Never  Vaping Use   Vaping Use: Never used  Substance and Sexual Activity   Alcohol use: Yes    Alcohol/week: 0.0 standard drinks of alcohol    Comment: OCC   Drug use: No   Sexual activity: Yes  Other Topics Concern   Not on file  Social History Narrative   Not on file   Social Determinants of Health   Financial Resource Strain: Not on file  Food Insecurity: Not on file  Transportation Needs: Not on file  Physical Activity: Not on file  Stress: Not on file  Social Connections: Not on file  Intimate Partner Violence: Not on file     Allergies  Allergen Reactions    Penicillins Anaphylaxis   Amlodipine Swelling    Gums swell   Lisinopril Swelling    Facial swelling   Sulfa Antibiotics Itching   Latex Rash     Outpatient Medications Prior to Visit  Medication Sig Dispense Refill   acetaminophen (TYLENOL) 325 MG tablet Take 2 tablets (650 mg total) by mouth every 4 (four) hours as needed for mild pain or moderate pain.     Cholecalciferol (CVS VIT D 5000 HIGH-POTENCY PO) Take 5,000 Units by mouth daily.     hydrochlorothiazide (HYDRODIURIL) 50 MG tablet Take 1 tablet (50 mg total) by mouth daily. 30 tablet 1   Omega-3 Fatty Acids (FISH OIL) 1000 MG CAPS Take 1,000 mg by mouth daily.     Probiotic Product (PROBIOTIC DAILY PO) Take 1 Capful by mouth daily.     Turmeric 500 MG CAPS Take 500 mg by mouth daily.     No facility-administered medications prior to visit.    Review of Systems  Constitutional:  Negative for chills, fever, malaise/fatigue and weight loss.  HENT:  Negative for hearing loss, sore throat and tinnitus.   Eyes:  Negative for blurred vision and double vision.  Respiratory:  Positive for cough. Negative for hemoptysis, sputum production, shortness of breath, wheezing and stridor.   Cardiovascular:  Negative for chest pain, palpitations, orthopnea, leg swelling and PND.  Gastrointestinal:  Positive for heartburn. Negative for abdominal pain, constipation, diarrhea, nausea and vomiting.  Genitourinary:  Negative for dysuria, hematuria and urgency.  Musculoskeletal:  Negative for joint pain and myalgias.  Skin:  Negative for itching and rash.  Neurological:  Negative for dizziness, tingling, weakness and headaches.  Endo/Heme/Allergies:  Negative for environmental allergies. Does not bruise/bleed easily.  Psychiatric/Behavioral:  Negative for depression. The patient is not nervous/anxious and does not have insomnia.   All other systems reviewed and are negative.    Objective:  Physical Exam Vitals reviewed.  Constitutional:       General: She is not in acute distress.    Appearance: She is well-developed.  HENT:     Head: Normocephalic and atraumatic.  Eyes:     General: No scleral icterus.    Conjunctiva/sclera: Conjunctivae normal.     Pupils: Pupils are equal, round, and reactive to light.  Neck:     Vascular: No JVD.  Trachea: No tracheal deviation.  Cardiovascular:     Rate and Rhythm: Normal rate and regular rhythm.     Heart sounds: Normal heart sounds. No murmur heard. Pulmonary:     Effort: Pulmonary effort is normal. No tachypnea, accessory muscle usage or respiratory distress.     Breath sounds: No stridor. No wheezing, rhonchi or rales.  Abdominal:     General: There is no distension.     Palpations: Abdomen is soft.     Tenderness: There is no abdominal tenderness.  Musculoskeletal:        General: No tenderness.     Cervical back: Neck supple.  Lymphadenopathy:     Cervical: No cervical adenopathy.  Skin:    General: Skin is warm and dry.     Capillary Refill: Capillary refill takes less than 2 seconds.     Findings: No rash.  Neurological:     Mental Status: She is alert and oriented to person, place, and time.  Psychiatric:        Behavior: Behavior normal.      Vitals:   06/09/22 0901  BP: 130/70  Pulse: (!) 108  SpO2: 97%  Weight: 131 lb 6.4 oz (59.6 kg)  Height: 4\' 9"  (1.448 m)   97% on RA BMI Readings from Last 3 Encounters:  06/09/22 28.43 kg/m  06/06/22 25.78 kg/m  05/13/22 26.21 kg/m   Wt Readings from Last 3 Encounters:  06/09/22 131 lb 6.4 oz (59.6 kg)  06/06/22 132 lb (59.9 kg)  05/13/22 134 lb 3.2 oz (60.9 kg)     CBC    Component Value Date/Time   WBC 10.8 (H) 05/13/2022 1405   WBC 13.3 (H) 04/10/2022 0230   RBC 4.33 05/13/2022 1405   HGB 12.4 05/13/2022 1405   HGB 13.0 02/05/2021 0958   HCT 37.1 05/13/2022 1405   HCT 39.6 02/05/2021 0958   PLT 406 (H) 05/13/2022 1405   PLT 344 02/05/2021 0958   MCV 85.7 05/13/2022 1405   MCV 87  02/05/2021 0958   MCH 28.6 05/13/2022 1405   MCHC 33.4 05/13/2022 1405   RDW 13.7 05/13/2022 1405   RDW 13.1 02/05/2021 0958   LYMPHSABS 2.8 05/13/2022 1405   LYMPHSABS 2.1 02/05/2021 0958   MONOABS 0.8 05/13/2022 1405   EOSABS 0.2 05/13/2022 1405   EOSABS 0.1 02/05/2021 0958   BASOSABS 0.1 05/13/2022 1405   BASOSABS 0.0 02/05/2021 0958     Chest Imaging: 02/03/2022 super D CT chest without contrast: Right upper lobe subsolid groundglass lesion concerning for malignancy. Has slowly been enlarging. The patient's images have been independently reviewed by me.    Pulmonary Functions Testing Results:    Latest Ref Rng & Units 03/07/2022   12:29 PM  PFT Results  FVC-Pre L 1.50   FVC-Predicted Pre % 72   FVC-Post L 1.55   FVC-Predicted Post % 74   Pre FEV1/FVC % % 90   Post FEV1/FCV % % 92   FEV1-Pre L 1.34   FEV1-Predicted Pre % 87   FEV1-Post L 1.43   DLCO uncorrected ml/min/mmHg 18.45   DLCO UNC% % 119   DLVA Predicted % 147   TLC L 3.53   TLC % Predicted % 85   RV % Predicted % 82     FeNO:   Pathology:   Echocardiogram:   Heart Catheterization:     Assessment & Plan:     ICD-10-CM   1. Malignant neoplasm of upper lobe of right lung (HCC)  C34.11     2. S/P lobectomy of lung  Z90.2     3. Family history of breast cancer  Z80.3     4. Family history of uterine cancer  Z80.49        Discussion:  This is a 76 year old female, non-smoker history of right upper lobe groundglass opacity slowly enlarging over time morphology concerning for a low-grade adenocarcinoma patient was taken for a single anesthetic event with Dr. Kipp Brood and myself.  Patient underwent a right upper lobe lobectomy.  Plan: Patient has follow-up with 46-month screening images/surveillance after malignancy diagnosis already scheduled by medical oncology. She is established care with medical oncology. She still has a cough.  This is somewhat better.  She has a lot of reflux symptoms I  counseled her on reflux precautions. She may need to consider a PPI in the future. Follow-up with [place] as needed.   Current Outpatient Medications:    albuterol (VENTOLIN HFA) 108 (90 Base) MCG/ACT inhaler, Inhale 2 puffs into the lungs every 6 (six) hours as needed for wheezing or shortness of breath., Disp: 8 g, Rfl: 6   acetaminophen (TYLENOL) 325 MG tablet, Take 2 tablets (650 mg total) by mouth every 4 (four) hours as needed for mild pain or moderate pain., Disp: , Rfl:    Cholecalciferol (CVS VIT D 5000 HIGH-POTENCY PO), Take 5,000 Units by mouth daily., Disp: , Rfl:    hydrochlorothiazide (HYDRODIURIL) 50 MG tablet, Take 1 tablet (50 mg total) by mouth daily., Disp: 30 tablet, Rfl: 1   Omega-3 Fatty Acids (FISH OIL) 1000 MG CAPS, Take 1,000 mg by mouth daily., Disp: , Rfl:    Probiotic Product (PROBIOTIC DAILY PO), Take 1 Capful by mouth daily., Disp: , Rfl:    Turmeric 500 MG CAPS, Take 500 mg by mouth daily., Disp: , Rfl:     Garner Nash, DO [facility] 06/09/2022 9:16 AM

## 2022-11-11 ENCOUNTER — Ambulatory Visit (HOSPITAL_COMMUNITY)
Admission: RE | Admit: 2022-11-11 | Discharge: 2022-11-11 | Disposition: A | Discharge status: Home / Self Care | Payer: Medicare Other | Source: Ambulatory Visit | Attending: Internal Medicine | Admitting: Internal Medicine

## 2022-11-11 ENCOUNTER — Inpatient Hospital Stay: Payer: Medicare Other | Attending: Internal Medicine

## 2022-11-11 ENCOUNTER — Other Ambulatory Visit: Payer: Self-pay

## 2022-11-11 DIAGNOSIS — Z79899 Other long term (current) drug therapy: Secondary | ICD-10-CM | POA: Insufficient documentation

## 2022-11-11 DIAGNOSIS — J439 Emphysema, unspecified: Secondary | ICD-10-CM | POA: Insufficient documentation

## 2022-11-11 DIAGNOSIS — Z888 Allergy status to other drugs, medicaments and biological substances status: Secondary | ICD-10-CM | POA: Insufficient documentation

## 2022-11-11 DIAGNOSIS — C3490 Malignant neoplasm of unspecified part of unspecified bronchus or lung: Secondary | ICD-10-CM | POA: Diagnosis present

## 2022-11-11 DIAGNOSIS — Z803 Family history of malignant neoplasm of breast: Secondary | ICD-10-CM | POA: Insufficient documentation

## 2022-11-11 DIAGNOSIS — K760 Fatty (change of) liver, not elsewhere classified: Secondary | ICD-10-CM | POA: Insufficient documentation

## 2022-11-11 DIAGNOSIS — Z882 Allergy status to sulfonamides status: Secondary | ICD-10-CM | POA: Insufficient documentation

## 2022-11-11 DIAGNOSIS — Z90710 Acquired absence of both cervix and uterus: Secondary | ICD-10-CM | POA: Insufficient documentation

## 2022-11-11 DIAGNOSIS — Z902 Acquired absence of lung [part of]: Secondary | ICD-10-CM | POA: Insufficient documentation

## 2022-11-11 DIAGNOSIS — R1110 Vomiting, unspecified: Secondary | ICD-10-CM | POA: Insufficient documentation

## 2022-11-11 DIAGNOSIS — Z880 Allergy status to penicillin: Secondary | ICD-10-CM | POA: Insufficient documentation

## 2022-11-11 DIAGNOSIS — Z8049 Family history of malignant neoplasm of other genital organs: Secondary | ICD-10-CM | POA: Insufficient documentation

## 2022-11-11 DIAGNOSIS — C3411 Malignant neoplasm of upper lobe, right bronchus or lung: Secondary | ICD-10-CM | POA: Insufficient documentation

## 2022-11-11 LAB — CBC WITH DIFFERENTIAL (CANCER CENTER ONLY)
Abs Immature Granulocytes: 0.04 10*3/uL (ref 0.00–0.07)
Basophils Absolute: 0.1 10*3/uL (ref 0.0–0.1)
Basophils Relative: 1 %
Eosinophils Absolute: 0.1 10*3/uL (ref 0.0–0.5)
Eosinophils Relative: 1 %
HCT: 37 % (ref 36.0–46.0)
Hemoglobin: 12.6 g/dL (ref 12.0–15.0)
Immature Granulocytes: 1 %
Lymphocytes Relative: 31 %
Lymphs Abs: 2.7 10*3/uL (ref 0.7–4.0)
MCH: 29.2 pg (ref 26.0–34.0)
MCHC: 34.1 g/dL (ref 30.0–36.0)
MCV: 85.8 fL (ref 80.0–100.0)
Monocytes Absolute: 0.6 10*3/uL (ref 0.1–1.0)
Monocytes Relative: 7 %
Neutro Abs: 5.1 10*3/uL (ref 1.7–7.7)
Neutrophils Relative %: 59 %
Platelet Count: 372 10*3/uL (ref 150–400)
RBC: 4.31 MIL/uL (ref 3.87–5.11)
RDW: 13.4 % (ref 11.5–15.5)
WBC Count: 8.7 10*3/uL (ref 4.0–10.5)
nRBC: 0 % (ref 0.0–0.2)

## 2022-11-11 LAB — CMP (CANCER CENTER ONLY)
ALT: 12 U/L (ref 0–44)
AST: 17 U/L (ref 15–41)
Albumin: 4.1 g/dL (ref 3.5–5.0)
Alkaline Phosphatase: 47 U/L (ref 38–126)
Anion gap: 7 (ref 5–15)
BUN: 18 mg/dL (ref 8–23)
CO2: 33 mmol/L — ABNORMAL HIGH (ref 22–32)
Calcium: 9.9 mg/dL (ref 8.9–10.3)
Chloride: 103 mmol/L (ref 98–111)
Creatinine: 0.73 mg/dL (ref 0.44–1.00)
GFR, Estimated: >60 mL/min (ref >60)
Glucose, Bld: 90 mg/dL (ref 70–99)
Potassium: 3.7 mmol/L (ref 3.5–5.1)
Sodium: 143 mmol/L (ref 135–145)
Total Bilirubin: 0.5 mg/dL (ref 0.3–1.2)
Total Protein: 7.1 g/dL (ref 6.5–8.1)

## 2022-11-11 MED ORDER — IOHEXOL 300 MG/ML  SOLN
75.0000 mL | Freq: Once | INTRAMUSCULAR | Status: AC | PRN
  Administered 2022-11-11: 75 mL via INTRAVENOUS

## 2022-11-11 MED ORDER — SODIUM CHLORIDE (PF) 0.9 % IJ SOLN
INTRAMUSCULAR | Status: AC
  Filled 2022-11-11: qty 50

## 2022-11-13 ENCOUNTER — Other Ambulatory Visit: Payer: Self-pay

## 2022-11-13 ENCOUNTER — Inpatient Hospital Stay (HOSPITAL_BASED_OUTPATIENT_CLINIC_OR_DEPARTMENT_OTHER): Payer: Medicare Other | Admitting: Internal Medicine

## 2022-11-13 VITALS — BP 110/74 | HR 79 | Temp 98.4°F | Resp 17 | Wt 125.7 lb

## 2022-11-13 DIAGNOSIS — C3411 Malignant neoplasm of upper lobe, right bronchus or lung: Secondary | ICD-10-CM | POA: Diagnosis present

## 2022-11-13 DIAGNOSIS — Z880 Allergy status to penicillin: Secondary | ICD-10-CM | POA: Diagnosis not present

## 2022-11-13 DIAGNOSIS — C3490 Malignant neoplasm of unspecified part of unspecified bronchus or lung: Secondary | ICD-10-CM | POA: Diagnosis not present

## 2022-11-13 DIAGNOSIS — Z803 Family history of malignant neoplasm of breast: Secondary | ICD-10-CM | POA: Diagnosis not present

## 2022-11-13 DIAGNOSIS — J439 Emphysema, unspecified: Secondary | ICD-10-CM | POA: Diagnosis not present

## 2022-11-13 DIAGNOSIS — Z79899 Other long term (current) drug therapy: Secondary | ICD-10-CM | POA: Diagnosis not present

## 2022-11-13 DIAGNOSIS — Z882 Allergy status to sulfonamides status: Secondary | ICD-10-CM | POA: Diagnosis not present

## 2022-11-13 DIAGNOSIS — R1110 Vomiting, unspecified: Secondary | ICD-10-CM | POA: Diagnosis not present

## 2022-11-13 DIAGNOSIS — Z90710 Acquired absence of both cervix and uterus: Secondary | ICD-10-CM | POA: Diagnosis not present

## 2022-11-13 DIAGNOSIS — K760 Fatty (change of) liver, not elsewhere classified: Secondary | ICD-10-CM | POA: Diagnosis not present

## 2022-11-13 DIAGNOSIS — Z902 Acquired absence of lung [part of]: Secondary | ICD-10-CM | POA: Diagnosis not present

## 2022-11-13 DIAGNOSIS — Z888 Allergy status to other drugs, medicaments and biological substances status: Secondary | ICD-10-CM | POA: Diagnosis not present

## 2022-11-13 DIAGNOSIS — Z8049 Family history of malignant neoplasm of other genital organs: Secondary | ICD-10-CM | POA: Diagnosis not present

## 2022-11-13 NOTE — Progress Notes (Signed)
[facility] [phone]   [phone]  OFFICE PROGRESS NOTE  Cipriano Mile, NP [place] [address]  DIAGNOSIS: Stage IA (T1b, N0, M0) non-small cell lung cancer, adenocarcinoma presented with right upper lobe lung nodule  PRIOR THERAPY:  Status post right upper lobectomy with lymph node sampling under the care of Dr. Kipp Brood on 04/08/2022. The tumor size was 1.2 cm with no evidence for metastasis to the lymph node or visceral pleural or lymphovascular invasion   CURRENT THERAPY: Observation  INTERVAL HISTORY: Erica Greene 77 y.o. female returns to the clinic today for 6 months follow-up visit accompanied by her daughter.  The patient is feeling fine today with no concerning complaints except for intermittent fits of cough followed by vomiting.  The patient denied having any current chest pain, shortness of breath or hemoptysis.  She has no current nausea, abdominal pain, diarrhea or constipation.  She has no headache or visual changes.  She has no recent weight loss or night sweats.  She had repeat CT scan of the chest performed recently and she is here for evaluation and discussion of her scan results.   MEDICAL HISTORY: Past Medical History:  Diagnosis Date   10 year risk of MI or stroke 7.5% or greater 08/17/2020   16.2%   Anemia    during pregnancies   Cancer ([facility])    pre-cancerous skin lesions on face. surgically removed.   Family history of breast cancer    Family history of uterine cancer    Hypertension    Pneumonia    as a child    ALLERGIES:  is allergic to penicillins, amlodipine, lisinopril, sulfa antibiotics, and latex.  MEDICATIONS:  Current Outpatient Medications  Medication Sig Dispense Refill   acetaminophen (TYLENOL) 325 MG tablet Take 2 tablets (650 mg total) by mouth every 4 (four) hours as needed for mild pain or moderate pain.     albuterol (VENTOLIN HFA) 108 (90 Base) MCG/ACT inhaler Inhale 2  puffs into the lungs every 6 (six) hours as needed for wheezing or shortness of breath. 8 g 6   Cholecalciferol ([employer]S VIT D 5000 HIGH-POTENCY PO) Take 5,000 Units by mouth daily.     hydrochlorothiazide (HYDRODIURIL) 50 MG tablet Take 1 tablet (50 mg total) by mouth daily. 30 tablet 1   Omega-3 Fatty Acids (FISH OIL) 1000 MG CAPS Take 1,000 mg by mouth daily.     Probiotic Product (PROBIOTIC DAILY PO) Take 1 Capful by mouth daily.     Turmeric 500 MG CAPS Take 500 mg by mouth daily.     No current facility-administered medications for this visit.    SURGICAL HISTORY:  Past Surgical History:  Procedure Laterality Date   ABDOMINAL HYSTERECTOMY  1997   TOTAL ABDOMINAL HYSTERECTOMY   BREAST SURGERY     BREAST REDUCTION   COLONOSCOPY     11 yrs ago in [place]- was Normal per pt.   cyst removal foot     CYST REMOVAL HAND     INTERCOSTAL NERVE BLOCK Right 04/08/2022   Procedure: INTERCOSTAL NERVE BLOCK;  Surgeon: Lajuana Matte, MD;  Location: [facility];  Service: Thoracic;  Laterality: Right;   NODE DISSECTION Right 04/08/2022   Procedure: NODE DISSECTION;  Surgeon: Lajuana Matte, MD;  Location: [facility];  Service: Thoracic;  Laterality: Right;   WISDOM TOOTH EXTRACTION      REVIEW OF SYSTEMS:  A comprehensive review of systems was negative except  for: Respiratory: positive for cough   PHYSICAL EXAMINATION: General appearance: alert, cooperative, and no distress Head: Normocephalic, without obvious abnormality, atraumatic Neck: no adenopathy, no JVD, supple, symmetrical, trachea midline, and thyroid not enlarged, symmetric, no tenderness/mass/nodules Lymph nodes: Cervical, supraclavicular, and axillary nodes normal. Resp: clear to auscultation bilaterally Back: symmetric, no curvature. ROM normal. No CVA tenderness. Cardio: regular rate and rhythm, S1, S2 normal, no murmur, click, rub or gallop GI: soft, non-tender; bowel sounds normal; no masses,  no organomegaly Extremities:  extremities normal, atraumatic, no cyanosis or edema  ECOG PERFORMANCE STATUS: 1 - Symptomatic but completely ambulatory  Blood pressure 110/74, pulse 79, temperature 98.4 F (36.9 C), temperature source Oral, resp. rate 17, weight 125 lb 11.2 oz (57 kg), SpO2 100 %.  LABORATORY DATA: Lab Results  Component Value Date   WBC 8.7 11/11/2022   HGB 12.6 11/11/2022   HCT 37.0 11/11/2022   MCV 85.8 11/11/2022   PLT 372 11/11/2022      Chemistry      Component Value Date/Time   NA 143 11/11/2022 0855   NA 143 02/05/2021 0958   K 3.7 11/11/2022 0855   CL 103 11/11/2022 0855   CO2 33 (H) 11/11/2022 0855   BUN 18 11/11/2022 0855   BUN 15 02/05/2021 0958   CREATININE 0.73 11/11/2022 0855   GLU 78 08/03/2018 0834      Component Value Date/Time   CALCIUM 9.9 11/11/2022 0855   ALKPHOS 47 11/11/2022 0855   AST 17 11/11/2022 0855   ALT 12 11/11/2022 0855   BILITOT 0.5 11/11/2022 0855       RADIOGRAPHIC STUDIES: CT Chest W Contrast  Result Date: 11/11/2022 CLINICAL DATA:  Staging right-sided non-small-cell lung cancer. Prior surgery. * Tracking Code: BO * EXAM: CT CHEST WITH CONTRAST TECHNIQUE: Multidetector CT imaging of the chest was performed during intravenous contrast administration. RADIATION DOSE REDUCTION: This exam was performed according to the departmental dose-optimization program which includes automated exposure control, adjustment of the mA and/or kV according to patient size and/or use of iterative reconstruction technique. CONTRAST:  36mL OMNIPAQUE IOHEXOL 300 MG/ML  SOLN COMPARISON:  X-ray 06/05/2022 and older. PET-CT 03/05/2022. CT scan 02/03/2022 and older FINDINGS: Cardiovascular: Heart is nonenlarged. No significant pericardial effusion. Normal caliber thoracic aorta with some mild atherosclerotic partially calcified plaque and some intimal thickening. Mediastinum/Nodes: There is no specific abnormal lymph node enlargement identified in the axillary region or hila.  There is a small soft tissue nodule prevascular on series 2, image 55 which has been present on previous examination. Today this measures 10 x 9 mm. In May 2023 11 x 9 mm and April 2020 9 by 6 mm. Otherwise no specific abnormal mediastinal lymph node enlargement. Lungs/Pleura: Left lung is grossly clear. Only mild basilar scarring medially, unchanged. No left-sided mass, consolidation, pleural effusion or pneumothorax. There is volume loss along the right lung from lobectomy. There are areas of scarring, fibrotic change and some ill-defined ground-glass. The surgical changes are new from prior examination. There is specific increased pleural thickening at the extreme right lung apex which has some nodular contours. On series 6, image 50, the coronal data set this area measures 2.1 by 1.6 cm. There is also a spiculated area in the posterior right upper hemithorax with some presumed surgical change on series 5, image 33 measuring 14 x 9 mm. Upper Abdomen: Fatty liver infiltration seen in the upper abdomen. Preserved adrenal glands. Musculoskeletal: Mild degenerative changes seen along the spine. Stable sclerotic focus  at the T10 level, possible bone island. Stable since 2020. IMPRESSION: Interval right upper lobectomy. Presumed associated surgical changes with some scarring, spiculation as well as nodular pleural thickening at the apex but recommend attention on short follow-up. No new pleural effusion or other mass lesion. Small lymph node seen along the anterior superior mediastinum slightly larger than older examinations and also recommend attention on short follow-up. Aortic Atherosclerosis (ICD10-I70.0) and Emphysema (ICD10-J43.9). Electronically Signed   By: Jill Side M.D.   On: 11/11/2022 14:35    ASSESSMENT AND PLAN: This is a very pleasant 77 years old African-American female diagnosed with stage Ia (T1b, N0, M0) non-small cell lung cancer, adenocarcinoma presented with right upper lobe lung nodule  status post right upper lobectomy with lymph node sampling under the care of Dr. Kipp Brood with tumor size of 1.2 cm and no evidence for visceral pleural or lymphovascular invasion. The patient is currently on observation and she is doing fine with no concerning complaints except for the intermittent episodes of cough followed by vomiting. She had repeat CT scan of the chest that showed no concerning findings for disease recurrence or metastasis but she continues to have a small lymph node seen in the anterior mediastinum slightly larger than previous examination and need close monitoring.  I discussed the scan and showed the images to the patient and her daughter. I recommended for her to continue on observation with repeat CT scan of the chest in 6 months. I also recommend for the patient to see Dr. Valeta Harms or Dr. Melvyn Novas for evaluation of her persistent cough. She was advised to call immediately if she has any other concerning symptoms in the interval. The patient voices understanding of current disease status and treatment options and is in agreement with the current care plan.  All questions were answered. The patient knows to call the clinic with any problems, questions or concerns. We can certainly see the patient much sooner if necessary.  The total time spent in the appointment was 20 minutes.  Disclaimer: This note was dictated with voice recognition software. Similar sounding words can inadvertently be transcribed and may not be corrected upon review.

## 2023-02-05 ENCOUNTER — Encounter: Payer: Self-pay | Admitting: Pulmonary Disease

## 2023-02-05 ENCOUNTER — Ambulatory Visit (INDEPENDENT_AMBULATORY_CARE_PROVIDER_SITE_OTHER): Payer: Medicare Other | Admitting: Pulmonary Disease

## 2023-02-05 VITALS — BP 130/62 | HR 69 | Ht <= 58 in | Wt 124.4 lb

## 2023-02-05 DIAGNOSIS — Z902 Acquired absence of lung [part of]: Secondary | ICD-10-CM | POA: Diagnosis not present

## 2023-02-05 DIAGNOSIS — R053 Chronic cough: Secondary | ICD-10-CM | POA: Diagnosis not present

## 2023-02-05 DIAGNOSIS — C3411 Malignant neoplasm of upper lobe, right bronchus or lung: Secondary | ICD-10-CM | POA: Diagnosis not present

## 2023-02-05 DIAGNOSIS — Z8049 Family history of malignant neoplasm of other genital organs: Secondary | ICD-10-CM

## 2023-02-05 DIAGNOSIS — Z789 Other specified health status: Secondary | ICD-10-CM

## 2023-02-05 DIAGNOSIS — Z803 Family history of malignant neoplasm of breast: Secondary | ICD-10-CM | POA: Diagnosis not present

## 2023-02-05 MED ORDER — BUDESONIDE-FORMOTEROL FUMARATE 160-4.5 MCG/ACT IN AERO: 2.0000 | INHALATION_SPRAY | Freq: Two times a day (BID) | RESPIRATORY_TRACT | 6 refills | Status: DC

## 2023-02-05 NOTE — Progress Notes (Signed)
Synopsis: Referred in June 2023 for Lung nodule by Hillery Aldo, NP  Subjective:   PATIENT ID: Erica Greene GENDER: female DOB: Sep 17, 1946, MRN: [id number]  Chief Complaint  Patient presents with   Follow-up    F/up, dry cough    [name], past medical history of hypertension, lifelong non-smoker.  Paternal uncle with lung cancer, smoker mother with breast and uterine cancer.  Patient has a right upper lobe groundglass subsolid lesion that has slowly been enlarging.  Initially discovered in April 2022.  Repeat CT scan of the chest in 1 year documents that the lesion has grown minimally in size.  Slowly been getting bigger since 2019.  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  Patient referred to talk today about potential for bronchoscopy or even consideration for surgical evaluation and removal.  OV 06/09/2022: Here today for follow-up after surgical resection.Patient was taken by Dr. Cliffton Asters for right upper lobe lobectomy diagnosed with a T1b N0 M0 stage I adenocarcinoma of the lung.  Last follow-up visit in the office was on 06/06/2022, office note from Dr. Cliffton Asters reviewed.  Patient has also established care with medical oncology.  Office note from 05/13/2022 reviewed.  Currently has surveillance imaging planned.  She has a repeat CT scan of the chest ordered by Dr. Shirline Frees already.  OV 02/05/2023: Here today for follow-up.  Doing well since her surgery.  Unfortunately still having cough.  Cough that wakes her up at nighttime.  She does have allergy symptoms.  Only been using as needed albuterol.  Recent CT scan of the chest suggest no set evidence of recurrence.  She does however have a small lymph node in the anterior mediastinum.    Past Medical History:  Diagnosis Date   10 year risk of MI or stroke 7.5% or greater 08/17/2020   16.2%   Anemia    during pregnancies   Cancer ([facility])    pre-cancerous skin lesions on face. surgically removed.    Family history of breast cancer    Family history of uterine cancer    Hypertension    Pneumonia    as a child     Family History  Problem Relation Age of Onset   Breast cancer Mother 43   Uterine cancer Mother 31   Cancer Father        LUNG AND THROAT - SMOKER   Cancer Paternal Uncle        LUNG    Cancer Paternal Uncle        LUNG   Cancer Paternal Uncle        LUNG   Cancer Paternal Uncle        LUNG   Kidney failure Brother    Cervical cancer Maternal Aunt        dx in her 39s   Congestive Heart Failure Maternal Aunt 25   Uterine cancer Paternal Aunt        dx >50   Kidney failure Maternal Grandfather    Cancer Paternal Grandmother        NOS   Cancer Paternal Grandfather        lung and throat - smoker   Colon cancer Neg Hx    Colon polyps Neg Hx      Past Surgical History:  Procedure Laterality Date   ABDOMINAL HYSTERECTOMY  1997   TOTAL ABDOMINAL HYSTERECTOMY   BREAST SURGERY     BREAST REDUCTION   COLONOSCOPY     11 yrs  ago in [place]- was Normal per pt.   cyst removal foot     CYST REMOVAL HAND     INTERCOSTAL NERVE BLOCK Right 04/08/2022   Procedure: INTERCOSTAL NERVE BLOCK;  Surgeon: Corliss Skains, MD;  Location: [facility];  Service: Thoracic;  Laterality: Right;   NODE DISSECTION Right 04/08/2022   Procedure: NODE DISSECTION;  Surgeon: Corliss Skains, MD;  Location: [facility];  Service: Thoracic;  Laterality: Right;   WISDOM TOOTH EXTRACTION      Social History   Socioeconomic History   Marital status: Married    Spouse name: Deatra Canter   Number of children: 2   Years of education: Not on file   Highest education level: Not on file  Occupational History   Not on file  Tobacco Use   Smoking status: Never   Smokeless tobacco: Never  Vaping Use   Vaping Use: Never used  Substance and Sexual Activity   Alcohol use: Yes    Alcohol/week: 0.0 standard drinks of alcohol    Comment: O[facility]   Drug use: No   Sexual activity: Yes  Other Topics  Concern   Not on file  Social History Narrative   Not on file   Social Determinants of Health   Financial Resource Strain: Not on file  Food Insecurity: Not on file  Transportation Needs: Not on file  Physical Activity: Not on file  Stress: Not on file  Social Connections: Not on file  Intimate Partner Violence: Not on file     Allergies  Allergen Reactions   Penicillins Anaphylaxis   Amlodipine Swelling    Gums swell   Lisinopril Swelling    Facial swelling   Sulfa Antibiotics Itching   Latex Rash     Outpatient Medications Prior to Visit  Medication Sig Dispense Refill   acetaminophen (TYLENOL) 325 MG tablet Take 2 tablets (650 mg total) by mouth every 4 (four) hours as needed for mild pain or moderate pain.     albuterol (VENTOLIN HFA) 108 (90 Base) MCG/ACT inhaler Inhale 2 puffs into the lungs every 6 (six) hours as needed for wheezing or shortness of breath. 8 g 6   Cholecalciferol ([employer]S VIT D 5000 HIGH-POTENCY PO) Take 5,000 Units by mouth daily.     hydrochlorothiazide (HYDRODIURIL) 50 MG tablet Take 1 tablet (50 mg total) by mouth daily. 30 tablet 1   Omega-3 Fatty Acids (FISH OIL) 1000 MG CAPS Take 1,000 mg by mouth daily.     Probiotic Product (PROBIOTIC DAILY PO) Take 1 Capful by mouth daily.     Turmeric 500 MG CAPS Take 500 mg by mouth daily.     No facility-administered medications prior to visit.    Review of Systems  Constitutional:  Negative for chills, fever, malaise/fatigue and weight loss.  HENT:  Negative for hearing loss, sore throat and tinnitus.   Eyes:  Negative for blurred vision and double vision.  Respiratory:  Negative for cough, hemoptysis, sputum production, shortness of breath, wheezing and stridor.   Cardiovascular:  Negative for chest pain, palpitations, orthopnea, leg swelling and PND.  Gastrointestinal:  Negative for abdominal pain, constipation, diarrhea, heartburn, nausea and vomiting.  Genitourinary:  Negative for dysuria, hematuria  and urgency.  Musculoskeletal:  Negative for joint pain and myalgias.  Skin:  Negative for itching and rash.  Neurological:  Negative for dizziness, tingling, weakness and headaches.  Endo/Heme/Allergies:  Negative for environmental allergies. Does not bruise/bleed easily.  Psychiatric/Behavioral:  Negative for depression. The patient  is not nervous/anxious and does not have insomnia.   All other systems reviewed and are negative.    Objective:  Physical Exam Vitals reviewed.  Constitutional:      General: She is not in acute distress.    Appearance: She is well-developed.  HENT:     Head: Normocephalic and atraumatic.  Eyes:     General: No scleral icterus.    Conjunctiva/sclera: Conjunctivae normal.     Pupils: Pupils are equal, round, and reactive to light.  Neck:     Vascular: No JVD.     Trachea: No tracheal deviation.  Cardiovascular:     Rate and Rhythm: Normal rate and regular rhythm.     Heart sounds: Normal heart sounds. No murmur heard. Pulmonary:     Effort: Pulmonary effort is normal. No tachypnea, accessory muscle usage or respiratory distress.     Breath sounds: No stridor. No wheezing, rhonchi or rales.  Abdominal:     General: There is no distension.     Palpations: Abdomen is soft.     Tenderness: There is no abdominal tenderness.  Musculoskeletal:        General: No tenderness.     Cervical back: Neck supple.  Lymphadenopathy:     Cervical: No cervical adenopathy.  Skin:    General: Skin is warm and dry.     Capillary Refill: Capillary refill takes less than 2 seconds.     Findings: No rash.  Neurological:     Mental Status: She is alert and oriented to person, place, and time.  Psychiatric:        Behavior: Behavior normal.      Vitals:   02/05/23 1010  BP: 130/62  Pulse: 69  SpO2: 100%  Weight: 124 lb 6.4 oz (56.4 kg)  Height: 4\' 9"  (1.448 m)   100% on RA BMI Readings from Last 3 Encounters:  02/05/23 26.92 kg/m  11/13/22 27.20  kg/m  06/09/22 28.43 kg/m   Wt Readings from Last 3 Encounters:  02/05/23 124 lb 6.4 oz (56.4 kg)  11/13/22 125 lb 11.2 oz (57 kg)  06/09/22 131 lb 6.4 oz (59.6 kg)     CBC    Component Value Date/Time   WBC 8.7 11/11/2022 0855   WBC 13.3 (H) 04/10/2022 0230   RBC 4.31 11/11/2022 0855   HGB 12.6 11/11/2022 0855   HGB 13.0 02/05/2021 0958   HCT 37.0 11/11/2022 0855   HCT 39.6 02/05/2021 0958   PLT 372 11/11/2022 0855   PLT 344 02/05/2021 0958   MCV 85.8 11/11/2022 0855   MCV 87 02/05/2021 0958   MCH 29.2 11/11/2022 0855   MCHC 34.1 11/11/2022 0855   RDW 13.4 11/11/2022 0855   RDW 13.1 02/05/2021 0958   LYMPHSABS 2.7 11/11/2022 0855   LYMPHSABS 2.1 02/05/2021 0958   MONOABS 0.6 11/11/2022 0855   EOSABS 0.1 11/11/2022 0855   EOSABS 0.1 02/05/2021 0958   BASOSABS 0.1 11/11/2022 0855   BASOSABS 0.0 02/05/2021 0958     Chest Imaging: 02/03/2022 super D CT chest without contrast: Right upper lobe subsolid groundglass lesion concerning for malignancy. Has slowly been enlarging. The patient's images have been independently reviewed by me.    February 2024 CT chest: Small lymph node in the anterior mediastinum. No evidence of recurrence of disease. The patient's images have been independently reviewed by me.    Pulmonary Functions Testing Results:    Latest Ref Rng & Units 03/07/2022   12:29 PM  PFT Results  FVC-Pre L 1.50   FVC-Predicted Pre % 72   FVC-Post L 1.55   FVC-Predicted Post % 74   Pre FEV1/FVC % % 90   Post FEV1/FCV % % 92   FEV1-Pre L 1.34   FEV1-Predicted Pre % 87   FEV1-Post L 1.43   DLCO uncorrected ml/min/mmHg 18.45   DLCO UNC% % 119   DLVA Predicted % 147   TLC L 3.53   TLC % Predicted % 85   RV % Predicted % 82     FeNO:   Pathology:   Echocardiogram:   Heart Catheterization:     Assessment & Plan:     ICD-10-CM   1. Chronic cough  R05.3     2. Malignant neoplasm of upper lobe of right lung (HCC)  C34.11     3. S/P lobectomy  of lung  Z90.2     4. Family history of breast cancer  Z80.3     5. Family history of uterine cancer  Z80.49     6. Non-smoker  Z78.9        Discussion:  [name], non-smoker, right upper lobe groundglass opacity still enlarging over time concerning for a low-grade adenocarcinoma status postresection with single anesthetic event with Dr. Cliffton Asters and myself.  Patient underwent right upper lobectomy and tolerated this well.  Diagnosed with a stage Ib disease.  Plan: Continue her 8-month CT image follow-up with medical oncology. Here today to discuss her ongoing cough. Will trial her on a ICS/LABA to see if this improves symptoms. I think that she has potentially some cough related to her postsurgical changes and allergy symptoms that she is experiencing. Would consider ongoing management of her reflux as well.  Return to clinic to see APP in 3 months.    Current Outpatient Medications:    acetaminophen (TYLENOL) 325 MG tablet, Take 2 tablets (650 mg total) by mouth every 4 (four) hours as needed for mild pain or moderate pain., Disp: , Rfl:    albuterol (VENTOLIN HFA) 108 (90 Base) MCG/ACT inhaler, Inhale 2 puffs into the lungs every 6 (six) hours as needed for wheezing or shortness of breath., Disp: 8 g, Rfl: 6   budesonide-formoterol (SYMBICORT) 160-4.5 MCG/ACT inhaler, Inhale 2 puffs into the lungs in the morning and at bedtime., Disp: 1 each, Rfl: 6   Cholecalciferol (CVS VIT D 5000 HIGH-POTENCY PO), Take 5,000 Units by mouth daily., Disp: , Rfl:    hydrochlorothiazide (HYDRODIURIL) 50 MG tablet, Take 1 tablet (50 mg total) by mouth daily., Disp: 30 tablet, Rfl: 1   Omega-3 Fatty Acids (FISH OIL) 1000 MG CAPS, Take 1,000 mg by mouth daily., Disp: , Rfl:    Probiotic Product (PROBIOTIC DAILY PO), Take 1 Capful by mouth daily., Disp: , Rfl:    Turmeric 500 MG CAPS, Take 500 mg by mouth daily., Disp: , Rfl:     Josephine Igo, DO [facility] 02/05/2023 11:03 AM

## 2023-02-05 NOTE — Patient Instructions (Signed)
Thank you for visiting Dr. Tonia Brooms at [facility]. Today we recommend the following:  Meds ordered this encounter  Medications   budesonide-formoterol (SYMBICORT) 160-4.5 MCG/ACT inhaler    Sig: Inhale 2 puffs into the lungs in the morning and at bedtime.    Dispense:  1 each    Refill:  6   Return in about 3 months (around 05/08/2023) for with APP.    Please do your part to reduce the spread of COVID-19.

## 2023-03-23 ENCOUNTER — Telehealth: Payer: Self-pay | Admitting: Internal Medicine

## 2023-03-23 NOTE — Telephone Encounter (Signed)
Called patient regarding August appointments, patient is notified.

## 2023-04-27 ENCOUNTER — Other Ambulatory Visit: Payer: Self-pay | Admitting: Obstetrics and Gynecology

## 2023-04-27 DIAGNOSIS — R928 Other abnormal and inconclusive findings on diagnostic imaging of breast: Secondary | ICD-10-CM

## 2023-05-07 ENCOUNTER — Ambulatory Visit
Admission: RE | Admit: 2023-05-07 | Discharge: 2023-05-07 | Disposition: A | Discharge status: Home / Self Care | Payer: Medicare Other | Source: Ambulatory Visit | Attending: Obstetrics and Gynecology | Admitting: Obstetrics and Gynecology

## 2023-05-07 ENCOUNTER — Ambulatory Visit: Payer: Medicare Other

## 2023-05-07 DIAGNOSIS — R928 Other abnormal and inconclusive findings on diagnostic imaging of breast: Secondary | ICD-10-CM

## 2023-05-08 ENCOUNTER — Encounter: Payer: Self-pay | Admitting: Primary Care

## 2023-05-08 ENCOUNTER — Ambulatory Visit: Payer: Medicare Other | Admitting: Primary Care

## 2023-05-08 VITALS — BP 128/76 | HR 74 | Temp 98.3°F | Ht <= 58 in | Wt 125.4 lb

## 2023-05-08 DIAGNOSIS — J45991 Cough variant asthma: Secondary | ICD-10-CM

## 2023-05-08 DIAGNOSIS — K219 Gastro-esophageal reflux disease without esophagitis: Secondary | ICD-10-CM | POA: Diagnosis not present

## 2023-05-08 NOTE — Assessment & Plan Note (Addendum)
-   Nocturnal cough improved with Symbicort. No daytime symptoms or shortness of breath. - Continue Symbicort two puffs at bedtime

## 2023-05-08 NOTE — Patient Instructions (Addendum)
Recommendations: Continue Symbicort 2 puffs at bedtime (rinse mouth after use) Stop fish oil as this could be causing reflux symptoms Follow GERD diet   Follow-up 6 months with Dr. Tonia Brooms or sooner if needed   Food Choices for Gastroesophageal Reflux Disease, Adult When you have gastroesophageal reflux disease (GERD), the foods you eat and your eating habits are very important. Choosing the right foods can help ease your discomfort. Think about working with a food expert (dietitian) to help you make good choices. What are tips for following this plan? Reading food labels Look for foods that are low in saturated fat. Foods that may help with your symptoms include: Foods that have less than 5% of daily value (DV) of fat. Foods that have 0 grams of trans fat. Cooking Do not fry your food. Cook your food by baking, steaming, grilling, or broiling. These are all methods that do not need a lot of fat for cooking. To add flavor, try to use herbs that are low in spice and acidity. Meal planning  Choose healthy foods that are low in fat, such as: Fruits and vegetables. Whole grains. Low-fat dairy products. Lean meats, fish, and poultry. Eat small meals often instead of eating 3 large meals each day. Eat your meals slowly in a place where you are relaxed. Avoid bending over or lying down until 2-3 hours after eating. Limit high-fat foods such as fatty meats or fried foods. Limit your intake of fatty foods, such as oils, butter, and shortening. Avoid the following as told by your doctor: Foods that cause symptoms. These may be different for different people. Keep a food diary to keep track of foods that cause symptoms. Alcohol. Drinking a lot of liquid with meals. Eating meals during the 2-3 hours before bed. Lifestyle Stay at a healthy weight. Ask your doctor what weight is healthy for you. If you need to lose weight, work with your doctor to do so safely. Exercise for at least 30 minutes  on 5 or more days each week, or as told by your doctor. Wear loose-fitting clothes. Do not smoke or use any products that contain nicotine or tobacco. If you need help quitting, ask your doctor. Sleep with the head of your bed higher than your feet. Use a wedge under the mattress or blocks under the bed frame to raise the head of the bed. Chew sugar-free gum after meals. What foods should eat?  Eat a healthy, well-balanced diet of fruits, vegetables, whole grains, low-fat dairy products, lean meats, fish, and poultry. Each person is different. Foods that may cause symptoms in one person may not cause any symptoms in another person. Work with your doctor to find foods that are safe for you. The items listed above may not be a complete list of what you can eat and drink. Contact a food expert for more options. What foods should I avoid? Limiting some of these foods may help in managing the symptoms of GERD. Everyone is different. Talk with a food expert or your doctor to help you find the exact foods to avoid, if any. Fruits Any fruits prepared with added fat. Any fruits that cause symptoms. For some people, this may include citrus fruits, such as oranges, grapefruit, pineapple, and lemons. Vegetables Deep-fried vegetables. [place] fries. Any vegetables prepared with added fat. Any vegetables that cause symptoms. For some people, this may include tomatoes and tomato products, chili peppers, onions and garlic, and horseradish. Grains Pastries or quick breads with added fat. Meats  and other proteins High-fat meats, such as fatty beef or pork, hot dogs, ribs, ham, sausage, salami, and bacon. Fried meat or protein, including fried fish and fried chicken. Nuts and nut butters, in large amounts. Dairy Whole milk and chocolate milk. Sour cream. Cream. Ice cream. Cream cheese. Milkshakes. Fats and oils Butter. Margarine. Shortening. Ghee. Beverages Coffee and tea, with or without caffeine. Carbonated  beverages. Sodas. Energy drinks. Fruit juice made with acidic fruits, such as orange or grapefruit. Tomato juice. Alcoholic drinks. Sweets and desserts Chocolate and cocoa. Donuts. Seasonings and condiments Pepper. Peppermint and spearmint. Added salt. Any condiments, herbs, or seasonings that cause symptoms. For some people, this may include curry, hot sauce, or vinegar-based salad dressings. The items listed above may not be a complete list of what you should not eat and drink. Contact a food expert for more options. Questions to ask your doctor Diet and lifestyle changes are often the first steps that are taken to manage symptoms of GERD. If diet and lifestyle changes do not help, talk with your doctor about taking medicines. Where to find more information [employer] Gastrointestinal [employer]: aboutgerd.org Summary When you have GERD, food and lifestyle choices are very important in easing your symptoms. Eat small meals often instead of 3 large meals a day. Eat your meals slowly and in a place where you are relaxed. Avoid bending over or lying down until 2-3 hours after eating. Limit high-fat foods such as fatty meats or fried foods. This information is not intended to replace advice given to you by your health care provider. Make sure you discuss any questions you have with your health care provider. Document Revised: 03/26/2020 Document Reviewed: 03/26/2020 Elsevier Patient Education  2024 [employer].

## 2023-05-08 NOTE — Progress Notes (Signed)
@Patient  ID: Erica Greene, female    DOB: Jan 04, 1946, 77 y.o.   MRN: [id number]  Chief Complaint  Patient presents with   Follow-up    Cough at bedtime persistent.    Referring provider: Hillery Aldo, NP  HPI: 77 year old female, never smoked. PMH significant for HTN, adenocarcinoma s/p lobectomy, cough variant asthma.   Previous [facility] pulmonary encounter: 02/05/23- Erica Greene  [name], past medical history of hypertension, lifelong non-smoker.  Paternal uncle with lung cancer, smoker mother with breast and uterine cancer.  Patient has a right upper lobe groundglass subsolid lesion that has slowly been enlarging.  Initially discovered in April 2022.  Repeat CT scan of the chest in 1 year documents that the lesion has grown minimally in size.  Slowly been getting bigger since 2019.  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  Patient referred to talk today about potential for bronchoscopy or even consideration for surgical evaluation and removal.  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  Last follow-up visit in the office was on 06/06/2022, office note from Erica Greene reviewed.  Patient has also established care with medical oncology.  Office note from 05/13/2022 reviewed.  Currently has surveillance imaging planned.  She has a repeat CT scan of the chest ordered by Erica Greene already.  OV 02/05/2023: Here today for follow-up.  Doing well since her surgery.  Unfortunately still having cough.  Cough that wakes her up at nighttime.  She does have allergy symptoms.  Only been using as needed albuterol.  Recent CT scan of the chest suggest no set evidence of recurrence.  She does however have a small lymph node in the anterior mediastinum.  05/08/2023- Interim hx  Patient presents today for follow-up cough. She is  doing well. She has a dry cough at night. Symbicort has been helping, she only uses inhaler before bedtime. Denies shortness of breath or daytime symptoms. She has some GERD symptoms, especially notices when bending over. Reflux medication didn't help.    Allergies  Allergen Reactions   Penicillins Anaphylaxis   Amlodipine Swelling    Gums swell   Lisinopril Swelling    Facial swelling   Sulfa Antibiotics Itching   Latex Rash    Immunization History  Administered Date(s) Administered   Influenza Split 09/29/2010   Influenza Whole 05/06/2018   Influenza, High Dose Seasonal PF 06/25/2012   Influenza,inj,Quad PF,6+ Mos 08/09/2013, 06/12/2014   Influenza,trivalent, recombinat, inj, PF 09/29/2010   Influenza-Unspecified 06/13/2021   PFIZER(Purple Top)SARS-COV-2 Vaccination 10/19/2019, 11/06/2019, 06/25/2020, 01/02/2021   Pfizer Covid-19 Vaccine Bivalent Booster 66yrs & up 06/13/2021   Pneumococcal Conjugate-13 06/12/2014   Pneumococcal Polysaccharide-23 09/29/2009   Pneumococcal-Unspecified 06/13/2015, 02/06/2021   Tdap 09/29/2006   Zoster, Live 09/29/2009    Past Medical History:  Diagnosis Date   10 year risk of MI or stroke 7.5% or greater 08/17/2020   16.2%   Anemia    during pregnancies   Cancer (HCC)    pre-cancerous skin lesions on face. surgically removed.   Family history of breast cancer    Family history of uterine cancer    Hypertension    Pneumonia    as a child    Tobacco History: Social History   Tobacco Use  Smoking Status Never  Smokeless Tobacco Never   Counseling given: Not Answered   Outpatient Medications  Prior to Visit  Medication Sig Dispense Refill   acetaminophen (TYLENOL) 325 MG tablet Take 2 tablets (650 mg total) by mouth every 4 (four) hours as needed for mild pain or moderate pain.     budesonide-formoterol (SYMBICORT) 160-4.5 MCG/ACT inhaler Inhale 2 puffs into the lungs in the morning and at bedtime. (Patient taking differently:  Inhale 2 puffs into the lungs at bedtime.) 1 each 6   Cholecalciferol ([employer] VIT D 5000 HIGH-POTENCY PO) Take 5,000 Units by mouth daily.     Coenzyme Q10 (COQ10 PO) Take 1 tablet by mouth at bedtime.     hydrochlorothiazide (HYDRODIURIL) 50 MG tablet Take 1 tablet (50 mg total) by mouth daily. 30 tablet 1   Probiotic Product (PROBIOTIC DAILY PO) Take 1 Capful by mouth daily.     Turmeric 500 MG CAPS Take 500 mg by mouth daily.     Omega-3 Fatty Acids (FISH OIL) 1000 MG CAPS Take 1,000 mg by mouth daily.     albuterol (VENTOLIN HFA) 108 (90 Base) MCG/ACT inhaler Inhale 2 puffs into the lungs every 6 (six) hours as needed for wheezing or shortness of breath. (Patient not taking: Reported on 05/08/2023) 8 g 6   No facility-administered medications prior to visit.   Review of Systems  Review of Systems  Constitutional: Negative.   HENT: Negative.    Respiratory: Negative.  Negative for chest tightness, shortness of breath and wheezing.        Nocturnal cough  Cardiovascular: Negative.    Physical Exam  BP 128/76 (BP Location: Left Arm, Patient Position: Sitting, Cuff Size: Normal)   Pulse 74   Temp 98.3 F (36.8 C) (Oral)   Ht 4' 9.5" (1.461 m)   Wt 125 lb 6.4 oz (56.9 kg)   SpO2 99%   BMI 26.67 kg/m  Physical Exam Constitutional:      Appearance: Normal appearance.  HENT:     Head: Normocephalic and atraumatic.     Mouth/Throat:     Mouth: Mucous membranes are moist.     Pharynx: Oropharynx is clear.  Cardiovascular:     Rate and Rhythm: Normal rate and regular rhythm.  Pulmonary:     Effort: Pulmonary effort is normal.     Breath sounds: Normal breath sounds. No wheezing, rhonchi or rales.  Skin:    General: Skin is warm and dry.  Neurological:     General: No focal deficit present.     Mental Status: She is alert and oriented to person, place, and time. Mental status is at baseline.  Psychiatric:        Mood and Affect: Mood normal.        Behavior: Behavior normal.         Thought Content: Thought content normal.        Judgment: Judgment normal.      Lab Results:  CBC    Component Value Date/Time   WBC 8.7 11/11/2022 0855   WBC 13.3 (H) 04/10/2022 0230   RBC 4.31 11/11/2022 0855   HGB 12.6 11/11/2022 0855   HGB 13.0 02/05/2021 0958   HCT 37.0 11/11/2022 0855   HCT 39.6 02/05/2021 0958   PLT 372 11/11/2022 0855   PLT 344 02/05/2021 0958   MCV 85.8 11/11/2022 0855   MCV 87 02/05/2021 0958   MCH 29.2 11/11/2022 0855   MCHC 34.1 11/11/2022 0855   RDW 13.4 11/11/2022 0855   RDW 13.1 02/05/2021 0958   LYMPHSABS 2.7 11/11/2022 0855   LYMPHSABS  2.1 02/05/2021 0958   MONOABS 0.6 11/11/2022 0855   EOSABS 0.1 11/11/2022 0855   EOSABS 0.1 02/05/2021 0958   BASOSABS 0.1 11/11/2022 0855   BASOSABS 0.0 02/05/2021 0958    BMET    Component Value Date/Time   NA 143 11/11/2022 0855   NA 143 02/05/2021 0958   K 3.7 11/11/2022 0855   CL 103 11/11/2022 0855   CO2 33 (H) 11/11/2022 0855   GLUCOSE 90 11/11/2022 0855   BUN 18 11/11/2022 0855   BUN 15 02/05/2021 0958   CREATININE 0.73 11/11/2022 0855   CALCIUM 9.9 11/11/2022 0855   GFRNONAA >60 11/11/2022 0855   GFRAA 85 08/16/2020 1207    BNP No results found for: "BNP"  ProBNP No results found for: "PROBNP"  Imaging: No results found.   Assessment & Plan:   Cough variant asthma vs UACS - Nocturnal cough improved with Symbicort. No daytime symptoms or shortness of breath. - Continue Symbicort two puffs at bedtime   GERD (gastroesophageal reflux disease) - Advised patient to stop taking fish oil as this could be making reflux worse - Recommend following GERD diet - Refer to gastroenterology    Glenford Bayley, NP 05/08/2023

## 2023-05-08 NOTE — Assessment & Plan Note (Addendum)
-   Advised patient to stop taking fish oil as this could be making reflux worse - Recommend following GERD diet - Refer to gastroenterology

## 2023-05-12 ENCOUNTER — Encounter (HOSPITAL_COMMUNITY): Payer: Self-pay

## 2023-05-12 ENCOUNTER — Inpatient Hospital Stay: Payer: Medicare Other | Attending: Internal Medicine

## 2023-05-12 ENCOUNTER — Other Ambulatory Visit: Payer: Self-pay

## 2023-05-12 ENCOUNTER — Ambulatory Visit (HOSPITAL_COMMUNITY)
Admission: RE | Admit: 2023-05-12 | Discharge: 2023-05-12 | Disposition: A | Discharge status: Home / Self Care | Payer: Medicare Other | Source: Ambulatory Visit | Attending: Internal Medicine | Admitting: Internal Medicine

## 2023-05-12 DIAGNOSIS — Z7951 Long term (current) use of inhaled steroids: Secondary | ICD-10-CM | POA: Insufficient documentation

## 2023-05-12 DIAGNOSIS — Z90710 Acquired absence of both cervix and uterus: Secondary | ICD-10-CM | POA: Insufficient documentation

## 2023-05-12 DIAGNOSIS — C3490 Malignant neoplasm of unspecified part of unspecified bronchus or lung: Secondary | ICD-10-CM | POA: Diagnosis not present

## 2023-05-12 DIAGNOSIS — I10 Essential (primary) hypertension: Secondary | ICD-10-CM | POA: Insufficient documentation

## 2023-05-12 DIAGNOSIS — Z902 Acquired absence of lung [part of]: Secondary | ICD-10-CM | POA: Insufficient documentation

## 2023-05-12 DIAGNOSIS — Z880 Allergy status to penicillin: Secondary | ICD-10-CM | POA: Insufficient documentation

## 2023-05-12 DIAGNOSIS — Z79899 Other long term (current) drug therapy: Secondary | ICD-10-CM | POA: Insufficient documentation

## 2023-05-12 DIAGNOSIS — K219 Gastro-esophageal reflux disease without esophagitis: Secondary | ICD-10-CM | POA: Insufficient documentation

## 2023-05-12 DIAGNOSIS — Z888 Allergy status to other drugs, medicaments and biological substances status: Secondary | ICD-10-CM | POA: Insufficient documentation

## 2023-05-12 DIAGNOSIS — Z882 Allergy status to sulfonamides status: Secondary | ICD-10-CM | POA: Insufficient documentation

## 2023-05-12 DIAGNOSIS — C3411 Malignant neoplasm of upper lobe, right bronchus or lung: Secondary | ICD-10-CM | POA: Insufficient documentation

## 2023-05-12 DIAGNOSIS — R059 Cough, unspecified: Secondary | ICD-10-CM | POA: Insufficient documentation

## 2023-05-12 LAB — CBC WITH DIFFERENTIAL (CANCER CENTER ONLY)
Abs Immature Granulocytes: 0.03 10*3/uL (ref 0.00–0.07)
Basophils Absolute: 0.1 10*3/uL (ref 0.0–0.1)
Basophils Relative: 1 %
Eosinophils Absolute: 0.2 10*3/uL (ref 0.0–0.5)
Eosinophils Relative: 2 %
HCT: 39.4 % (ref 36.0–46.0)
Hemoglobin: 13.4 g/dL (ref 12.0–15.0)
Immature Granulocytes: 0 %
Lymphocytes Relative: 33 %
Lymphs Abs: 2.8 10*3/uL (ref 0.7–4.0)
MCH: 29.3 pg (ref 26.0–34.0)
MCHC: 34 g/dL (ref 30.0–36.0)
MCV: 86 fL (ref 80.0–100.0)
Monocytes Absolute: 0.5 10*3/uL (ref 0.1–1.0)
Monocytes Relative: 6 %
Neutro Abs: 5 10*3/uL (ref 1.7–7.7)
Neutrophils Relative %: 58 %
Platelet Count: 389 10*3/uL (ref 150–400)
RBC: 4.58 MIL/uL (ref 3.87–5.11)
RDW: 14 % (ref 11.5–15.5)
WBC Count: 8.7 10*3/uL (ref 4.0–10.5)
nRBC: 0 % (ref 0.0–0.2)

## 2023-05-12 LAB — CMP (CANCER CENTER ONLY)
ALT: 15 U/L (ref 0–44)
AST: 20 U/L (ref 15–41)
Albumin: 4.2 g/dL (ref 3.5–5.0)
Alkaline Phosphatase: 57 U/L (ref 38–126)
Anion gap: 6 (ref 5–15)
BUN: 23 mg/dL (ref 8–23)
CO2: 33 mmol/L — ABNORMAL HIGH (ref 22–32)
Calcium: 9.8 mg/dL (ref 8.9–10.3)
Chloride: 102 mmol/L (ref 98–111)
Creatinine: 0.75 mg/dL (ref 0.44–1.00)
GFR, Estimated: >60 mL/min (ref >60)
Glucose, Bld: 94 mg/dL (ref 70–99)
Potassium: 4.2 mmol/L (ref 3.5–5.1)
Sodium: 141 mmol/L (ref 135–145)
Total Bilirubin: 0.8 mg/dL (ref 0.3–1.2)
Total Protein: 7.5 g/dL (ref 6.5–8.1)

## 2023-05-12 MED ORDER — IOHEXOL 300 MG/ML  SOLN
75.0000 mL | Freq: Once | INTRAMUSCULAR | Status: AC | PRN
  Administered 2023-05-12: 75 mL via INTRAVENOUS

## 2023-05-12 MED ORDER — SODIUM CHLORIDE (PF) 0.9 % IJ SOLN
INTRAMUSCULAR | Status: AC
  Filled 2023-05-12: qty 50

## 2023-05-12 NOTE — Progress Notes (Unsigned)
[facility] OFFICE PROGRESS NOTE  Erica Aldo, NP [address] [place] [address]  DIAGNOSIS: Stage IA (T1b, N0, M0) non-small cell lung cancer, adenocarcinoma presented with right upper lobe lung nodule   PRIOR THERAPY: Status post right upper lobectomy with lymph node sampling under the care of Dr. Cliffton Asters on 04/08/2022. The tumor size was 1.2 cm with no evidence for metastasis to the lymph node or visceral pleural or lymphovascular invasion   CURRENT THERAPY: Observation   INTERVAL HISTORY: Erica Greene 77 y.o. female returns to the clinic today for a 46-month follow-up visit. The patient was last seen by Dr. Arbutus Ped in February 2024.  The patient has been doing well since that time and denies any new concerning complaints.  She denies any fever, chills, or night sweats. She reports she has been losing weight unintentionally. Pulmonary medicine recently referred her to GI due to GERD. She still is reporting GERD today. She reports she did not have a lot of improvement with protonix. Denies any chest pain, shortness of breath, or hemoptysis. She does sometimes get a cough at night. She is compliant with her Symbicort. Denies any abdominal pain, diarrhea, or constipation.  Denies any headache or visual changes.  She recently had a restaging CT scan performed.  She is here today for evaluation to review her scan results.  MEDICAL HISTORY: Past Medical History:  Diagnosis Date   10 year risk of MI or stroke 7.5% or greater 08/17/2020   16.2%   Anemia    during pregnancies   Cancer (HCC)    pre-cancerous skin lesions on face. surgically removed.   Family history of breast cancer    Family history of uterine cancer    Hypertension    Pneumonia    as a child    ALLERGIES:  is allergic to penicillins, amlodipine, lisinopril, sulfa antibiotics, and latex.  MEDICATIONS:  Current Outpatient Medications  Medication Sig Dispense Refill   acetaminophen (TYLENOL) 325  MG tablet Take 2 tablets (650 mg total) by mouth every 4 (four) hours as needed for mild pain or moderate pain.     albuterol (VENTOLIN HFA) 108 (90 Base) MCG/ACT inhaler Inhale 2 puffs into the lungs every 6 (six) hours as needed for wheezing or shortness of breath. (Patient not taking: Reported on 05/08/2023) 8 g 6   budesonide-formoterol (SYMBICORT) 160-4.5 MCG/ACT inhaler Inhale 2 puffs into the lungs in the morning and at bedtime. (Patient taking differently: Inhale 2 puffs into the lungs at bedtime.) 1 each 6   Cholecalciferol ([employer]S VIT D 5000 HIGH-POTENCY PO) Take 5,000 Units by mouth daily.     Coenzyme Q10 (COQ10 PO) Take 1 tablet by mouth at bedtime.     hydrochlorothiazide (HYDRODIURIL) 50 MG tablet Take 1 tablet (50 mg total) by mouth daily. 30 tablet 1   Probiotic Product (PROBIOTIC DAILY PO) Take 1 Capful by mouth daily.     Turmeric 500 MG CAPS Take 500 mg by mouth daily.     No current facility-administered medications for this visit.    SURGICAL HISTORY:  Past Surgical History:  Procedure Laterality Date   ABDOMINAL HYSTERECTOMY  1997   TOTAL ABDOMINAL HYSTERECTOMY   BREAST SURGERY     BREAST REDUCTION   COLONOSCOPY     11 yrs ago in [place]- was Normal per pt.   cyst removal foot     CYST REMOVAL HAND     INTERCOSTAL NERVE BLOCK Right 04/08/2022   Procedure: INTERCOSTAL NERVE BLOCK;  Surgeon: Corliss Skains, MD;  Location: [facility] OR;  Service: Thoracic;  Laterality: Right;   NODE DISSECTION Right 04/08/2022   Procedure: NODE DISSECTION;  Surgeon: Corliss Skains, MD;  Location: [facility] OR;  Service: Thoracic;  Laterality: Right;   WISDOM TOOTH EXTRACTION      REVIEW OF SYSTEMS:   Review of Systems  Constitutional: Positive for weight loss. Negative for appetite change, chills, fatigue, and fever. HENT: Negative for mouth sores, nosebleeds, sore throat and trouble swallowing.   Eyes: Negative for eye problems and icterus.  Respiratory: Positive for cough at night.  Negative for hemoptysis, shortness of breath and wheezing.   Cardiovascular: Negative for chest pain and leg swelling.  Gastrointestinal: Positive for indigestion. Negative for abdominal pain, constipation, diarrhea, nausea and vomiting.  Genitourinary: Negative for bladder incontinence, difficulty urinating, dysuria, frequency and hematuria.   Musculoskeletal: Negative for back pain, gait problem, neck pain and neck stiffness.  Skin: Negative for itching and rash.  Neurological: Negative for dizziness, extremity weakness, gait problem, headaches, light-headedness and seizures.  Hematological: Negative for adenopathy. Does not bruise/bleed easily.  Psychiatric/Behavioral: Negative for confusion, depression and sleep disturbance. The patient is not nervous/anxious.     PHYSICAL EXAMINATION:  There were no vitals taken for this visit.  ECOG PERFORMANCE STATUS: 0  Physical Exam  Constitutional: Oriented to person, place, and time and well-developed, well-nourished, and in no distress.  HENT:  Head: Normocephalic and atraumatic.  Mouth/Throat: Oropharynx is clear and moist. No oropharyngeal exudate.  Eyes: Conjunctivae are normal. Right eye exhibits no discharge. Left eye exhibits no discharge. No scleral icterus.  Neck: Normal range of motion. Neck supple.  Cardiovascular: Normal rate, regular rhythm, normal heart sounds and intact distal pulses.   Pulmonary/Chest: Effort normal and breath sounds normal. No respiratory distress. No wheezes. No rales.  Abdominal: Soft. Bowel sounds are normal. Exhibits no distension and no mass. There is no tenderness.  Musculoskeletal: Normal range of motion. Exhibits no edema.  Lymphadenopathy:    No cervical adenopathy.  Neurological: Alert and oriented to person, place, and time. Exhibits normal muscle tone. Gait normal. Coordination normal.  Skin: Skin is warm and dry. No rash noted. Not diaphoretic. No erythema. No pallor.  Psychiatric: Mood, memory  and judgment normal.  Vitals reviewed.  LABORATORY DATA: Lab Results  Component Value Date   WBC 8.7 11/11/2022   HGB 12.6 11/11/2022   HCT 37.0 11/11/2022   MCV 85.8 11/11/2022   PLT 372 11/11/2022      Chemistry      Component Value Date/Time   NA 143 11/11/2022 0855   NA 143 02/05/2021 0958   K 3.7 11/11/2022 0855   CL 103 11/11/2022 0855   CO2 33 (H) 11/11/2022 0855   BUN 18 11/11/2022 0855   BUN 15 02/05/2021 0958   CREATININE 0.73 11/11/2022 0855   GLU 78 08/03/2018 0834      Component Value Date/Time   CALCIUM 9.9 11/11/2022 0855   ALKPHOS 47 11/11/2022 0855   AST 17 11/11/2022 0855   ALT 12 11/11/2022 0855   BILITOT 0.5 11/11/2022 0855       RADIOGRAPHIC STUDIES:  No results found.   ASSESSMENT/PLAN:  This is a very pleasant 77 year old African-American female diagnosed with stage I (T1b, N0, M0) non-small cell lung cancer, adenocarcinoma.  She presented with right upper lobe nodule status post right upper lobectomy with lymph node sampling under the care of Dr. Cliffton Asters with tumor size of 1.2 cm and no  evidence of visceral pleural or lymphovascular invasion.  She is currently on observation and doing well..  The patient recently had a restaging CT scan performed.  Dr. Arbutus Ped personally and independently reviewed the scan and discussed the results with the patient today.  The scan showed no evidence of disease progression.   Recommend that she continue on observation with a restaging CT scan of the chest in 6 months. We will see her about 1 week after her CT scan to review the results.   She will follow up with GI regarding her GERD.   The patient was advised to call immediately if she has any concerning symptoms in the interval. The patient voices understanding of current disease status and treatment options and is in agreement with the current care plan. All questions were answered. The patient knows to call the clinic with any problems, questions or  concerns. We can certainly see the patient much sooner if necessary   No orders of the defined types were placed in this encounter.     L , PA-C 05/12/23  ADDENDUM: Hematology/Oncology Attending: I had a face-to-face encounter with the patient today.  I reviewed her record, lab, scan and recommended her care plan.  This is a very pleasant 77 years old African-American female with a stage Ia (T1b, N0, M0) non-small cell lung cancer, adenocarcinoma status post right upper lobectomy with lymph node sampling and has been on observation since that time.  The patient is feeling fine with no concerning complaints. She had repeat CT scan of the chest performed recently.  I personally and independently reviewed the scan and discussed the result with the patient and her husband. Her scan showed no concerning findings for disease recurrence or metastasis. I recommended for her to continue on observation with repeat CT scan of the chest in 6 months. She was advised to call immediately if she has any other concerning symptoms in the interval. Disclaimer: This note was dictated with voice recognition software. Similar sounding words can inadvertently be transcribed and may be missed upon review. Lajuana Matte, MD

## 2023-05-13 ENCOUNTER — Ambulatory Visit (HOSPITAL_COMMUNITY): Payer: Medicare Other

## 2023-05-14 ENCOUNTER — Inpatient Hospital Stay (HOSPITAL_BASED_OUTPATIENT_CLINIC_OR_DEPARTMENT_OTHER): Payer: Medicare Other | Admitting: Physician Assistant

## 2023-05-14 ENCOUNTER — Other Ambulatory Visit: Payer: Self-pay

## 2023-05-14 ENCOUNTER — Ambulatory Visit: Payer: Medicare Other | Admitting: Internal Medicine

## 2023-05-14 VITALS — BP 147/71 | HR 72 | Temp 97.0°F | Resp 14 | Wt 126.3 lb

## 2023-05-14 DIAGNOSIS — R059 Cough, unspecified: Secondary | ICD-10-CM | POA: Diagnosis not present

## 2023-05-14 DIAGNOSIS — Z882 Allergy status to sulfonamides status: Secondary | ICD-10-CM | POA: Diagnosis not present

## 2023-05-14 DIAGNOSIS — Z79899 Other long term (current) drug therapy: Secondary | ICD-10-CM | POA: Diagnosis not present

## 2023-05-14 DIAGNOSIS — Z90710 Acquired absence of both cervix and uterus: Secondary | ICD-10-CM | POA: Diagnosis not present

## 2023-05-14 DIAGNOSIS — C3411 Malignant neoplasm of upper lobe, right bronchus or lung: Secondary | ICD-10-CM | POA: Diagnosis not present

## 2023-05-14 DIAGNOSIS — I10 Essential (primary) hypertension: Secondary | ICD-10-CM | POA: Diagnosis not present

## 2023-05-14 DIAGNOSIS — Z7951 Long term (current) use of inhaled steroids: Secondary | ICD-10-CM | POA: Diagnosis not present

## 2023-05-14 DIAGNOSIS — K219 Gastro-esophageal reflux disease without esophagitis: Secondary | ICD-10-CM | POA: Diagnosis not present

## 2023-05-14 DIAGNOSIS — Z880 Allergy status to penicillin: Secondary | ICD-10-CM | POA: Diagnosis not present

## 2023-05-14 DIAGNOSIS — Z888 Allergy status to other drugs, medicaments and biological substances status: Secondary | ICD-10-CM | POA: Diagnosis not present

## 2023-05-14 DIAGNOSIS — Z902 Acquired absence of lung [part of]: Secondary | ICD-10-CM | POA: Diagnosis not present

## 2023-05-18 ENCOUNTER — Other Ambulatory Visit: Payer: Self-pay | Admitting: Obstetrics and Gynecology

## 2023-05-18 ENCOUNTER — Ambulatory Visit: Payer: Medicare Other

## 2023-05-18 ENCOUNTER — Telehealth: Payer: Self-pay | Admitting: Internal Medicine

## 2023-05-18 ENCOUNTER — Ambulatory Visit
Admission: RE | Admit: 2023-05-18 | Discharge: 2023-05-18 | Disposition: A | Discharge status: Home / Self Care | Payer: Medicare Other | Source: Ambulatory Visit | Attending: Obstetrics and Gynecology | Admitting: Obstetrics and Gynecology

## 2023-05-18 DIAGNOSIS — R928 Other abnormal and inconclusive findings on diagnostic imaging of breast: Secondary | ICD-10-CM

## 2023-06-17 ENCOUNTER — Other Ambulatory Visit: Payer: Self-pay | Admitting: Student

## 2023-06-17 DIAGNOSIS — I70213 Atherosclerosis of native arteries of extremities with intermittent claudication, bilateral legs: Secondary | ICD-10-CM

## 2023-07-06 ENCOUNTER — Inpatient Hospital Stay
Admission: RE | Admit: 2023-07-06 | Discharge: 2023-07-06 | Disposition: A | Discharge status: Home / Self Care | Payer: Medicare Other | Source: Ambulatory Visit | Attending: Student

## 2023-07-06 DIAGNOSIS — I70213 Atherosclerosis of native arteries of extremities with intermittent claudication, bilateral legs: Secondary | ICD-10-CM

## 2023-09-06 NOTE — Progress Notes (Unsigned)
Brief Narrative 77 y.o. yo female known to Dr. Myrtie Neither ( 2017) with a past medical history not limited to lung cancer, GERD, HTN, asthma. Patient referred by Pulmonary for GERD.     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 Up to date.  Normal screening colonoscopy April 2017, 10 year follow up recommended.     PLAN     HPI   Chief complaint :    Patient saw Pulmonary in Aug 2024 for dry cough. Reported GERD symptoms refractory to medication. Fish oil stopped, she was referred to GI      Procedure risk assessment:  No history of CHF.  No supplemental 02 use at home.  Not a known difficult airway Anticoagulant:    GI History / Studies   **May not be a complete list of studies     Labs      Latest Ref Rng & Units 05/12/2023    9:45 AM 11/11/2022    8:55 AM 05/13/2022    2:05 PM  CBC  WBC 4.0 - 10.5 K/uL 8.7  8.7  10.8   Hemoglobin 12.0 - 15.0 g/dL 53.6  64.4  03.4   Hematocrit 36.0 - 46.0 % 39.4  37.0  37.1   Platelets 150 - 400 K/uL 389  372  406     No results found for: "LIPASE"    Latest Ref Rng & Units 05/12/2023    9:45 AM 11/11/2022    8:55 AM 05/13/2022    2:05 PM  CMP  Glucose 70 - 99 mg/dL 94  90  742   BUN 8 - 23 mg/dL 23  18  16    Creatinine 0.44 - 1.00 mg/dL 5.95  6.38  7.56   Sodium 135 - 145 mmol/L 141  143  140   Potassium 3.5 - 5.1 mmol/L 4.2  3.7  3.6   Chloride 98 - 111 mmol/L 102  103  100   CO2 22 - 32 mmol/L 33  33  32   Calcium 8.9 - 10.3 mg/dL 9.8  9.9  43.3   Total Protein 6.5 - 8.1 g/dL 7.5  7.1  7.4   Total Bilirubin 0.3 - 1.2 mg/dL 0.8  0.5  0.5   Alkaline Phos 38 - 126 U/L 57  47  52   AST 15 - 41 U/L 20  17  14    ALT 0 - 44 U/L 15  12  10          US ARTERIAL ABI (SCREENING LOWER EXTREMITY) CLINICAL DATA:  Intermittent claudication  EXAM: NONINVASIVE PHYSIOLOGIC VASCULAR STUDY OF BILATERAL  LOWER EXTREMITIES  TECHNIQUE: Evaluation of both lower extremities were performed at rest, including calculation of ankle-brachial indices with single level Doppler, pressure and pulse volume recording.  COMPARISON:  None Available.  FINDINGS: Right ABI:  1.0  Left ABI:  1.0  Right Lower Extremity:  Normal arterial waveforms at the ankle.  Left Lower Extremity:  Normal arterial waveforms at the ankle.  1.0-1.4 Normal  IMPRESSION: Normal bilateral resting ankle-brachial indices.  Signed,  Sterling Big, MD, RPVI  [facility]  [facility]  Electronically Signed   By: Malachy Moan M.D.   On: 07/07/2023 14:48    Past Medical History:  Diagnosis Date   10 year risk of MI or stroke 7.5% or greater 08/17/2020   16.2%  Anemia    during pregnancies   Cancer (HCC)    pre-cancerous skin lesions on face. surgically removed.   Family history of breast cancer    Family history of uterine cancer    Hypertension    Pneumonia    as a child   Past Surgical History:  Procedure Laterality Date   ABDOMINAL HYSTERECTOMY  1997   TOTAL ABDOMINAL HYSTERECTOMY   BREAST SURGERY     BREAST REDUCTION   COLONOSCOPY     11 yrs ago in [place]- was Normal per pt.   cyst removal foot     CYST REMOVAL HAND     INTERCOSTAL NERVE BLOCK Right 04/08/2022   Procedure: INTERCOSTAL NERVE BLOCK;  Surgeon: Corliss Skains, MD;  Location: [facility] OR;  Service: Thoracic;  Laterality: Right;   NODE DISSECTION Right 04/08/2022   Procedure: NODE DISSECTION;  Surgeon: Corliss Skains, MD;  Location: [facility] OR;  Service: Thoracic;  Laterality: Right;   WISDOM TOOTH EXTRACTION     Family History  Problem Relation Age of Onset   Breast cancer Mother 40   Uterine cancer Mother 13   Cancer Father        LUNG AND THROAT - SMOKER   Breast cancer Daughter 30   Cervical cancer Maternal Aunt        dx in her 21s   Congestive Heart Failure  Maternal Aunt 80   Uterine cancer Paternal Aunt        dx >50   Cancer Paternal Uncle        LUNG    Cancer Paternal Uncle        LUNG   Cancer Paternal Uncle        LUNG   Cancer Paternal Uncle        LUNG   Kidney failure Maternal Grandfather    Cancer Paternal Grandmother        NOS   Cancer Paternal Grandfather        lung and throat - smoker   Kidney failure Brother    Colon cancer Neg Hx    Colon polyps Neg Hx    Social History   Tobacco Use   Smoking status: Never   Smokeless tobacco: Never  Vaping Use   Vaping status: Never Used  Substance Use Topics   Alcohol use: Yes    Alcohol/week: 0.0 standard drinks of alcohol    Comment: OCC   Drug use: No   Current Outpatient Medications  Medication Sig Dispense Refill   acetaminophen (TYLENOL) 325 MG tablet Take 2 tablets (650 mg total) by mouth every 4 (four) hours as needed for mild pain or moderate pain.     albuterol (VENTOLIN HFA) 108 (90 Base) MCG/ACT inhaler Inhale 2 puffs into the lungs every 6 (six) hours as needed for wheezing or shortness of breath. (Patient not taking: Reported on 05/08/2023) 8 g 6   atorvastatin (LIPITOR) 40 MG tablet Take 40 mg by mouth daily.     budesonide-formoterol (SYMBICORT) 160-4.5 MCG/ACT inhaler Inhale 2 puffs into the lungs in the morning and at bedtime. (Patient taking differently: Inhale 2 puffs into the lungs at bedtime.) 1 each 6   Cholecalciferol ([employer] VIT D 5000 HIGH-POTENCY PO) Take 5,000 Units by mouth daily.     Coenzyme Q10 (COQ10 PO) Take 1 tablet by mouth at bedtime.     hydrochlorothiazide (HYDRODIURIL) 50 MG tablet Take 1 tablet (50 mg total) by mouth daily. 30 tablet 1   Probiotic  Product (PROBIOTIC DAILY PO) Take 1 Capful by mouth daily.     Turmeric 500 MG CAPS Take 500 mg by mouth daily.     No current facility-administered medications for this visit.   Allergies  Allergen Reactions   Penicillins Anaphylaxis   Amlodipine Swelling    Gums swell   Lisinopril  Swelling    Facial swelling   Sulfa Antibiotics Itching   Latex Rash     Review of Systems: Positive for ***.  All other systems reviewed and negative except where noted in HPI.   Wt Readings from Last 3 Encounters:  05/14/23 126 lb 4.8 oz (57.3 kg)  05/08/23 125 lb 6.4 oz (56.9 kg)  02/05/23 124 lb 6.4 oz (56.4 kg)    Physical Exam:  There were no vitals taken for this visit. Constitutional:  Pleasant, generally well appearing ***female in no acute distress. Psychiatric:  Normal mood and affect. Behavior is normal. EENT: Pupils normal.  Conjunctivae are normal. No scleral icterus. Neck supple.  Cardiovascular: Normal rate, regular rhythm.  Pulmonary/chest: Effort normal and breath sounds normal. No wheezing, rales or rhonchi. Abdominal: Soft, nondistended, nontender. Bowel sounds active throughout. There are no masses palpable. No hepatomegaly. Neurological: Alert and oriented to person place and time. Extremities: *** No edema   Willette Cluster, NP  09/06/2023, 7:05 PM  Cc:  Referring Provider Ames Dura, NP

## 2023-09-07 ENCOUNTER — Encounter: Payer: Self-pay | Admitting: Nurse Practitioner

## 2023-09-07 ENCOUNTER — Ambulatory Visit (INDEPENDENT_AMBULATORY_CARE_PROVIDER_SITE_OTHER): Payer: Medicare Other | Admitting: Nurse Practitioner

## 2023-09-07 ENCOUNTER — Other Ambulatory Visit: Payer: Self-pay | Admitting: Student

## 2023-09-07 VITALS — BP 110/64 | HR 70 | Ht <= 58 in | Wt 128.0 lb

## 2023-09-07 DIAGNOSIS — Z1231 Encounter for screening mammogram for malignant neoplasm of breast: Secondary | ICD-10-CM

## 2023-09-07 DIAGNOSIS — Z85118 Personal history of other malignant neoplasm of bronchus and lung: Secondary | ICD-10-CM | POA: Diagnosis not present

## 2023-09-07 DIAGNOSIS — R0789 Other chest pain: Secondary | ICD-10-CM

## 2023-09-07 DIAGNOSIS — R09A2 Foreign body sensation, throat: Secondary | ICD-10-CM

## 2023-09-07 NOTE — Patient Instructions (Signed)
You have been scheduled for an endoscopy. Please follow written instructions given to you at your visit today.  If you use inhalers (even only as needed), please bring them with you on the day of your procedure.  If you take any of the following medications, they will need to be adjusted prior to your procedure:   DO NOT TAKE 7 DAYS PRIOR TO TEST- Trulicity (dulaglutide) Ozempic, Wegovy (semaglutide) Mounjaro (tirzepatide) Bydureon Bcise (exanatide extended release)  DO NOT TAKE 1 DAY PRIOR TO YOUR TEST Rybelsus (semaglutide) Adlyxin (lixisenatide) Victoza (liraglutide) Byetta (exanatide) ____________________________________________________________________  _______________________________________________________  If your blood pressure at your visit was 140/90 or greater, please contact your primary care physician to follow up on this.  _______________________________________________________  If you are age 77 or older, your body mass index should be between 23-30. Your Body mass index is 27.22 kg/m. If this is out of the aforementioned range listed, please consider follow up with your Primary Care Provider.  If you are age 31 or younger, your body mass index should be between 19-25. Your Body mass index is 27.22 kg/m. If this is out of the aformentioned range listed, please consider follow up with your Primary Care Provider.   ________________________________________________________  The [facility] providers would like to encourage you to use [employer] to communicate with providers for non-urgent requests or questions.  Due to long hold times on the telephone, sending your provider a message by [facility] may be a faster and more efficient way to get a response.  Please allow 48 business hours for a response.  Please remember that this is for non-urgent requests.  _______________________________________________________

## 2023-09-07 NOTE — Progress Notes (Signed)
____________________________________________________________  Attending physician addendum:  Thank you for sending this case to me. I have reviewed the entire note and agree with the plan.  An upper endoscopy is reasonable, though it is not entirely clear that the reported symptoms are reflux related.  Amada Jupiter, MD  ____________________________________________________________

## 2023-10-02 ENCOUNTER — Ambulatory Visit (AMBULATORY_SURGERY_CENTER): Payer: Medicare Other | Admitting: Gastroenterology

## 2023-10-02 ENCOUNTER — Encounter: Payer: Self-pay | Admitting: Gastroenterology

## 2023-10-02 VITALS — BP 138/69 | HR 61 | Temp 98.1°F | Resp 13 | Ht <= 58 in | Wt 128.0 lb

## 2023-10-02 DIAGNOSIS — R0789 Other chest pain: Secondary | ICD-10-CM

## 2023-10-02 DIAGNOSIS — R079 Chest pain, unspecified: Secondary | ICD-10-CM

## 2023-10-02 DIAGNOSIS — R09A2 Foreign body sensation, throat: Secondary | ICD-10-CM | POA: Diagnosis not present

## 2023-10-02 MED ORDER — SODIUM CHLORIDE 0.9 % IV SOLN: 500.0000 mL | Freq: Once | INTRAVENOUS | Status: DC

## 2023-10-02 NOTE — Op Note (Signed)
 [facility] Patient Name: Erica Greene Procedure Date: 10/02/2023 8:13 AM MRN: [id number] Endoscopist: Victory L. Legrand , MD, [id number] Age: 78 Referring MD:  Date of Birth: September 05, 1946 Gender: Female Account #: [id number] Procedure:                Upper GI endoscopy Indications:              Unexplained chest pain, Globus sensation Medicines:                Monitored Anesthesia Care Procedure:                Pre-Anesthesia Assessment:                           - Prior to the procedure, a History and Physical                            was performed, and patient medications and                            allergies were reviewed. The patient's tolerance of                            previous anesthesia was also reviewed. The risks                            and benefits of the procedure and the sedation                            options and risks were discussed with the patient.                            All questions were answered, and informed consent                            was obtained. Prior Anticoagulants: The patient has                            taken no anticoagulant or antiplatelet agents. ASA                            Grade Assessment: II - A patient with mild systemic                            disease. After reviewing the risks and benefits,                            the patient was deemed in satisfactory condition to                            undergo the procedure.                           After obtaining informed consent, the endoscope was  passed under direct vision. Throughout the                            procedure, the patient's blood pressure, pulse, and                            oxygen saturations were monitored continuously. The                            GIF [id number] #[id number] was introduced through the                            mouth, and advanced to the second part of duodenum.                            The upper GI  endoscopy was accomplished without                            difficulty. The patient tolerated the procedure                            well. Scope In: Scope Out: Findings:                 The larynx was normal.                           The esophagus was normal.                           The stomach was normal.                           The cardia and gastric fundus were normal on                            retroflexion.                           The examined duodenum was normal. Complications:            No immediate complications. Estimated Blood Loss:     Estimated blood loss: none. Impression:               - Normal larynx.                           - Normal esophagus.                           - Normal stomach.                           - Normal examined duodenum.                           - No specimens collected.  The overall clinical picture does not indicate GERD                            or other upper digestive condition as a cause for                            the patient's reported globus sensation and chest                            discomfort that have been present since a 2023                            right upper lobectomy. Recommendation:           - Patient has a contact number available for                            emergencies. The signs and symptoms of potential                            delayed complications were discussed with the                            patient. Return to normal activities tomorrow.                            Written discharge instructions were provided to the                            patient.                           - Resume previous diet.                           - Continue present medications. Acid suppression                            medicine has not been helpful for the symptoms and                            is not needed going forward.                           - Return to referring physician  for consideration                            of the utility of any chest/neck imaging. Kirsten Mckone L. Legrand, MD 10/02/2023 8:32:14 AM This report has been signed electronically.

## 2023-10-02 NOTE — Progress Notes (Signed)
 No significant changes to clinical history since GI office visit on 09/07/23.  The patient is appropriate for an endoscopic procedure in the ambulatory setting.  - Amada Jupiter, MD

## 2023-10-02 NOTE — Progress Notes (Signed)
 Pt's states no medical or surgical changes since previsit or office visit.

## 2023-10-02 NOTE — Patient Instructions (Signed)
 Resume previous diet and medications. Acid suppression medication is not needed going forward.  Return to referring Physician for consideration of the utility of any chest/neck imaging.  YOU HAD AN ENDOSCOPIC PROCEDURE TODAY AT THE [facility]:   Refer to the procedure report that was given to you for any specific questions about what was found during the examination.  If the procedure report does not answer your questions, please call your gastroenterologist to clarify.  If you requested that your care partner not be given the details of your procedure findings, then the procedure report has been included in a sealed envelope for you to review at your convenience later.  YOU SHOULD EXPECT: Some feelings of bloating in the abdomen. Passage of more gas than usual.  Walking can help get rid of the air that was put into your GI tract during the procedure and reduce the bloating. If you had a lower endoscopy (such as a colonoscopy or flexible sigmoidoscopy) you may notice spotting of blood in your stool or on the toilet paper. If you underwent a bowel prep for your procedure, you may not have a normal bowel movement for a few days.  Please Note:  You might notice some irritation and congestion in your nose or some drainage.  This is from the oxygen used during your procedure.  There is no need for concern and it should clear up in a day or so.  SYMPTOMS TO REPORT IMMEDIATELY:  Following upper endoscopy (EGD)  Vomiting of blood or coffee ground material  New chest pain or pain under the shoulder blades  Painful or persistently difficult swallowing  New shortness of breath  Fever of 100F or higher  Black, tarry-looking stools  For urgent or emergent issues, a gastroenterologist can be reached at any hour by calling [phone]. Do not use MyChart messaging for urgent concerns.    DIET:  We do recommend a small meal at first, but then you may proceed to your regular diet.  Drink  plenty of fluids but you should avoid alcoholic beverages for 24 hours.  ACTIVITY:  You should plan to take it easy for the rest of today and you should NOT DRIVE or use heavy machinery until tomorrow (because of the sedation medicines used during the test).    FOLLOW UP: Our staff will call the number listed on your records the next business day following your procedure.  We will call around 7:15- 8:00 am to check on you and address any questions or concerns that you may have regarding the information given to you following your procedure. If we do not reach you, we will leave a message.     If any biopsies were taken you will be contacted by phone or by letter within the next 1-3 weeks.  Please call us  at [phone] if you have not heard about the biopsies in 3 weeks.    SIGNATURES/CONFIDENTIALITY: You and/or your care partner have signed paperwork which will be entered into your electronic medical record.  These signatures attest to the fact that that the information above on your After Visit Summary has been reviewed and is understood.  Full responsibility of the confidentiality of this discharge information lies with you and/or your care-partner.

## 2023-10-05 ENCOUNTER — Telehealth: Payer: Self-pay

## 2023-10-05 NOTE — Telephone Encounter (Signed)
  Follow up Call-     10/02/2023    7:14 AM  Call back number  Post procedure Call Back phone  # [phone]  Permission to leave phone message Yes     Patient questions:  Do you have a fever, pain , or abdominal swelling? No. Pain Score  0 *  Have you tolerated food without any problems? Yes.    Have you been able to return to your normal activities? Yes.    Do you have any questions about your discharge instructions: Diet   No. Medications  No. Follow up visit  No.  Do you have questions or concerns about your Care? No.  Actions: * If pain score is 4 or above: No action needed, pain <4.

## 2023-11-10 ENCOUNTER — Inpatient Hospital Stay: Payer: Medicare Other | Attending: Internal Medicine

## 2023-11-10 ENCOUNTER — Ambulatory Visit (HOSPITAL_COMMUNITY)
Admission: RE | Admit: 2023-11-10 | Discharge: 2023-11-10 | Disposition: A | Discharge status: Home / Self Care | Payer: Medicare Other | Source: Ambulatory Visit | Attending: Physician Assistant | Admitting: Physician Assistant

## 2023-11-10 ENCOUNTER — Encounter (HOSPITAL_COMMUNITY): Payer: Self-pay

## 2023-11-10 DIAGNOSIS — Z880 Allergy status to penicillin: Secondary | ICD-10-CM | POA: Insufficient documentation

## 2023-11-10 DIAGNOSIS — C3411 Malignant neoplasm of upper lobe, right bronchus or lung: Secondary | ICD-10-CM | POA: Insufficient documentation

## 2023-11-10 DIAGNOSIS — Z902 Acquired absence of lung [part of]: Secondary | ICD-10-CM | POA: Diagnosis not present

## 2023-11-10 DIAGNOSIS — Z90710 Acquired absence of both cervix and uterus: Secondary | ICD-10-CM | POA: Insufficient documentation

## 2023-11-10 DIAGNOSIS — Z888 Allergy status to other drugs, medicaments and biological substances status: Secondary | ICD-10-CM | POA: Insufficient documentation

## 2023-11-10 DIAGNOSIS — Z882 Allergy status to sulfonamides status: Secondary | ICD-10-CM | POA: Diagnosis not present

## 2023-11-10 DIAGNOSIS — Z79899 Other long term (current) drug therapy: Secondary | ICD-10-CM | POA: Insufficient documentation

## 2023-11-10 DIAGNOSIS — Z803 Family history of malignant neoplasm of breast: Secondary | ICD-10-CM | POA: Diagnosis not present

## 2023-11-10 DIAGNOSIS — Z8049 Family history of malignant neoplasm of other genital organs: Secondary | ICD-10-CM | POA: Diagnosis not present

## 2023-11-10 LAB — CBC WITH DIFFERENTIAL (CANCER CENTER ONLY)
Abs Immature Granulocytes: 0.03 10*3/uL (ref 0.00–0.07)
Basophils Absolute: 0.1 10*3/uL (ref 0.0–0.1)
Basophils Relative: 1 %
Eosinophils Absolute: 0.1 10*3/uL (ref 0.0–0.5)
Eosinophils Relative: 1 %
HCT: 39.4 % (ref 36.0–46.0)
Hemoglobin: 12.9 g/dL (ref 12.0–15.0)
Immature Granulocytes: 0 %
Lymphocytes Relative: 27 %
Lymphs Abs: 2.3 10*3/uL (ref 0.7–4.0)
MCH: 28.7 pg (ref 26.0–34.0)
MCHC: 32.7 g/dL (ref 30.0–36.0)
MCV: 87.8 fL (ref 80.0–100.0)
Monocytes Absolute: 0.5 10*3/uL (ref 0.1–1.0)
Monocytes Relative: 6 %
Neutro Abs: 5.4 10*3/uL (ref 1.7–7.7)
Neutrophils Relative %: 65 %
Platelet Count: 351 10*3/uL (ref 150–400)
RBC: 4.49 MIL/uL (ref 3.87–5.11)
RDW: 14.2 % (ref 11.5–15.5)
WBC Count: 8.4 10*3/uL (ref 4.0–10.5)
nRBC: 0 % (ref 0.0–0.2)

## 2023-11-10 LAB — CMP (CANCER CENTER ONLY)
ALT: 11 U/L (ref 0–44)
AST: 18 U/L (ref 15–41)
Albumin: 4.1 g/dL (ref 3.5–5.0)
Alkaline Phosphatase: 52 U/L (ref 38–126)
Anion gap: 6 (ref 5–15)
BUN: 27 mg/dL — ABNORMAL HIGH (ref 8–23)
CO2: 33 mmol/L — ABNORMAL HIGH (ref 22–32)
Calcium: 9.8 mg/dL (ref 8.9–10.3)
Chloride: 101 mmol/L (ref 98–111)
Creatinine: 0.78 mg/dL (ref 0.44–1.00)
GFR, Estimated: >60 mL/min (ref >60)
Glucose, Bld: 103 mg/dL — ABNORMAL HIGH (ref 70–99)
Potassium: 3.5 mmol/L (ref 3.5–5.1)
Sodium: 140 mmol/L (ref 135–145)
Total Bilirubin: 0.7 mg/dL (ref 0.0–1.2)
Total Protein: 7.2 g/dL (ref 6.5–8.1)

## 2023-11-10 MED ORDER — IOHEXOL 300 MG/ML  SOLN
75.0000 mL | Freq: Once | INTRAMUSCULAR | Status: AC | PRN
  Administered 2023-11-10: 75 mL via INTRAVENOUS

## 2023-11-17 ENCOUNTER — Inpatient Hospital Stay (HOSPITAL_BASED_OUTPATIENT_CLINIC_OR_DEPARTMENT_OTHER): Payer: Medicare Other | Admitting: Internal Medicine

## 2023-11-17 VITALS — BP 135/67 | HR 74 | Temp 98.1°F | Resp 17 | Ht <= 58 in | Wt 130.7 lb

## 2023-11-17 DIAGNOSIS — C3411 Malignant neoplasm of upper lobe, right bronchus or lung: Secondary | ICD-10-CM | POA: Diagnosis not present

## 2023-11-17 DIAGNOSIS — C3490 Malignant neoplasm of unspecified part of unspecified bronchus or lung: Secondary | ICD-10-CM

## 2023-11-17 NOTE — Progress Notes (Signed)
[facility] [phone]   [phone]  OFFICE PROGRESS NOTE  Erica Greene, Erica Greene, Erica Greene, Erica Greene, Erica Greene:  Status post right upper lobectomy with lymph node sampling under the care of Dr. Cliffton Asters on 04/08/2022. The tumor size was 1.2 cm with no evidence for metastasis to the lymph node or visceral pleural or lymphovascular invasion   CURRENT Greene: Observation  INTERVAL HISTORY: Erica Greene 78 y.o. female returns to the clinic today for 25-month follow-up visit accompanied by her husband. Discussed the use of AI scribe software for clinical note transcription with the patient, who gave verbal consent to proceed.  History of Present Illness   Erica Greene is a 78 year old female with stage 1A non-small cell lung Greene who presents for routine follow-up. She is accompanied by her husband.  Diagnosed with stage 1A non-small cell lung Greene in July 2023, she underwent a right upper lobectomy. Since the surgery, she has been under surveillance with chest scans every six months. She feels okay and has not experienced any new health issues in the past six months.  Her most recent chest scan has been performed, but the formal report is pending. She underwent an upper endoscopy recently, which showed no ulcers or abnormalities in her throat.      MEDICAL HISTORY: Past Medical History:  Diagnosis Date   10 year risk of MI or stroke 7.5% or greater 08/17/2020   16.2%   Anemia    during pregnancies   Greene (HCC)    pre-cancerous skin lesions on face. surgically removed.   Family history of breast Greene    Family history of uterine Greene    Hypertension    Lung Greene (HCC) 03/2022   Pneumonia    as a child    ALLERGIES:  is allergic to penicillins, amlodipine, lisinopril, sulfa  antibiotics, and latex.  MEDICATIONS:  Current Outpatient Medications  Medication Sig Dispense Refill   acetaminophen (TYLENOL) 325 MG tablet Take 2 tablets (650 mg total) by mouth every 4 (four) hours as needed for mild pain or moderate pain.     atorvastatin (LIPITOR) 40 MG tablet Take 40 mg by mouth daily.     budesonide-formoterol (SYMBICORT) 160-4.5 MCG/ACT inhaler Inhale 2 puffs into the lungs in the morning and at bedtime. (Patient taking differently: Inhale 2 puffs into the lungs at bedtime.) 1 each 6   Cholecalciferol ([employer] VIT D 5000 HIGH-POTENCY PO) Take 5,000 Units by mouth daily.     Coenzyme Q10 (COQ10 PO) Take 1 tablet by mouth at bedtime.     hydrochlorothiazide (HYDRODIURIL) 50 MG tablet Take 1 tablet (50 mg total) by mouth daily. 30 tablet 1   Probiotic Product (PROBIOTIC DAILY PO) Take 1 Capful by mouth daily.     Turmeric 500 MG CAPS Take 500 mg by mouth daily.     No current facility-administered medications for this visit.    SURGICAL HISTORY:  Past Surgical History:  Procedure Laterality Date   ABDOMINAL HYSTERECTOMY  1997   TOTAL ABDOMINAL HYSTERECTOMY   BREAST SURGERY     BREAST REDUCTION   COLONOSCOPY     11 yrs ago in [place]- was Normal per pt.   cyst removal foot     CYST REMOVAL HAND     INTERCOSTAL NERVE BLOCK Right 04/08/2022   Procedure: INTERCOSTAL  NERVE BLOCK;  Surgeon: Corliss Skains, MD;  Location: [facility] OR;  Service: Thoracic;  Laterality: Right;   NODE DISSECTION Right 04/08/2022   Procedure: NODE DISSECTION;  Surgeon: Corliss Skains, MD;  Location: [facility] OR;  Service: Thoracic;  Laterality: Right;   WISDOM TOOTH EXTRACTION      REVIEW OF SYSTEMS:  A comprehensive review of systems was negative.   PHYSICAL EXAMINATION: General appearance: alert, cooperative, and no distress Head: Normocephalic, without obvious abnormality, atraumatic Neck: no adenopathy, no JVD, supple, symmetrical, trachea midline, and thyroid not enlarged, symmetric,  no tenderness/mass/nodules Lymph nodes: Cervical, supraclavicular, and axillary nodes normal. Resp: clear to auscultation bilaterally Back: symmetric, no curvature. ROM normal. No CVA tenderness. Cardio: regular rate and rhythm, S1, S2 normal, no murmur, click, rub or gallop GI: soft, non-tender; bowel sounds normal; no masses,  no organomegaly Extremities: extremities normal, atraumatic, no cyanosis or edema  ECOG PERFORMANCE STATUS: 1 - Symptomatic but completely ambulatory  Blood pressure 135/67, pulse 74, temperature 98.1 F (36.7 C), temperature source Temporal, resp. rate 17, height 4' 9.5" (1.461 m), weight 130 lb 11.2 oz (59.3 kg), SpO2 100%.  LABORATORY DATA: Lab Results  Component Value Date   WBC 8.4 11/10/2023   HGB 12.9 11/10/2023   HCT 39.4 11/10/2023   MCV 87.8 11/10/2023   PLT 351 11/10/2023      Chemistry      Component Value Date/Time   NA 140 11/10/2023 0927   NA 143 02/05/2021 0958   K 3.5 11/10/2023 0927   CL 101 11/10/2023 0927   CO2 33 (H) 11/10/2023 0927   BUN 27 (H) 11/10/2023 0927   BUN 15 02/05/2021 0958   CREATININE 0.78 11/10/2023 0927   GLU 78 08/03/2018 0834      Component Value Date/Time   CALCIUM 9.8 11/10/2023 0927   ALKPHOS 52 11/10/2023 0927   AST 18 11/10/2023 0927   ALT 11 11/10/2023 0927   BILITOT 0.7 11/10/2023 0927       RADIOGRAPHIC STUDIES: No results found.  ASSESSMENT AND PLAN: This is a very pleasant 78 years old African-American female diagnosed with stage Ia (T1b, Erica Greene, Erica Greene, Erica presented with right upper lobe lung nodule status post right upper lobectomy with lymph node sampling under the care of Dr. Cliffton Asters with tumor size of 1.2 cm and no evidence for visceral pleural or lymphovascular invasion.  She is currently on observation and feeling fine. The patient had repeat CT scan of the chest but the final report is still pending.  I personally independently reviewed the scan images  and I do not see any evidence for disease recurrence or metastasis but I will wait for the final report for confirmation.     Stage 1A Non-Small Cell Lung Greene (NSCLC) Erica Greene, a 78 year old female, was diagnosed with Stage 1A NSCLC in July 2023 and underwent a right upper lobectomy. Post-surgery, she has been monitored with biannual chest scans. The most recent scan showed no abnormalities, pending formal report. She reports no new symptoms since the last visit. Discussed the importance of continued surveillance for recurrence. - Continue surveillance with chest scan in six months - Transition to annual scans if the upcoming scan is clear - Contact patient immediately if any abnormalities are found in the current scan report.   The patient was advised to call immediately if she has any other concerning symptoms in the interval. The patient voices understanding of current disease status and treatment options and  is in agreement with the current care plan.  All questions were answered. The patient knows to call the clinic with any problems, questions or concerns. We can certainly see the patient much sooner if necessary.  The total time spent in the appointment was 20 minutes.  Disclaimer: This note was dictated with voice recognition software. Similar sounding words can inadvertently be transcribed and may not be corrected upon review.

## 2024-01-05 ENCOUNTER — Ambulatory Visit: Payer: Medicare Other | Admitting: Internal Medicine

## 2024-01-05 ENCOUNTER — Encounter: Payer: Self-pay | Admitting: Internal Medicine

## 2024-01-05 VITALS — BP 142/94 | HR 63 | Ht <= 58 in | Wt 131.2 lb

## 2024-01-05 DIAGNOSIS — J301 Allergic rhinitis due to pollen: Secondary | ICD-10-CM | POA: Diagnosis not present

## 2024-01-05 DIAGNOSIS — J45991 Cough variant asthma: Secondary | ICD-10-CM | POA: Diagnosis not present

## 2024-01-05 DIAGNOSIS — Z85118 Personal history of other malignant neoplasm of bronchus and lung: Secondary | ICD-10-CM | POA: Diagnosis not present

## 2024-01-05 MED ORDER — FLUTICASONE PROPIONATE 50 MCG/ACT NA SUSP: 1.0000 | Freq: Every day | NASAL | 5 refills | Status: AC

## 2024-01-05 NOTE — Patient Instructions (Addendum)
 It was a pleasure to see you today!  Please schedule follow up  with myself in 6 months.  If my schedule is not open yet, we will contact you with a reminder closer to that time. Please call [phone] if you haven't heard from [place] a month before, and always call us sooner if issues or concerns arise. You can also send [place] a message through MyChart, but but aware that this is not to be used for urgent issues and it may take up to 5-7 days to receive a reply. Please be aware that you will likely be able to view your results before I have a chance to respond to them. Please give [place] 5 business days to respond to any non-urgent results.    It was a pleasure to meet you today.  I suspect your symptoms of coughing for the last 3 weeks are most likely related to inflammation in your sinuses from seasonal allergies.  I recommend starting Flonase see instructions below.  Your other cough symptoms have been attributed to may have mild form of cough variant asthma.  I think it is reasonable for you to back down on your Symbicort inhaler to 1 puff twice a day to see if your symptoms are still controlled.  Eventually after a few weeks you can even back down to 1 puff once daily to see if that still controls your chronic cough related to asthma.  If your cough returns certainly I would go back up on this.  Flonase - 1 spray on each side of your nose twice a day for first week, then 1 spray on each side.   Instructions for use: If you also use a saline nasal spray or rinse, use that first. Position the head with the chin slightly tucked. Use the right hand to spray into the left nostril and the right hand to spray into the left nostril.   Point the bottle away from the septum of your nose (cartilage that divides the two sides of your nose).  Hold the nostril closed on the opposite side from where you will spray Spray once and gently sniff to pull the medicine into the higher parts of your nose.  Don't sniff too  hard as the medicine will drain down the back of your throat instead. Repeat with a second spray on the same side if prescribed. Repeat on the other side of your nose.

## 2024-01-05 NOTE — Progress Notes (Signed)
 Erica Greene    [id number]    July 23, 1946  Primary Care Physician:Stowe, Devonne Doughty, NP Date of Appointment: 01/05/2024 Established Patient Visit  Chief complaint:   Chief Complaint  Patient presents with   Consult    Constant coughing mostly at night, she coughs up clear mucus. Started three weeks ago.     HPI: Erica Greene is a 78 y.o. 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 She also has chronic cough.   Interval Updates:  Former patient of Dr. Tonia Brooms. Here to establish care with me today.  She has been on symbicort 2 puffs twice daily for possible asthma. The symbicort has been helping the cough previously.   She is having worsening cough of 3 weeks duration with clear mucus production. Cough is all day and all night. It is slowly getting better. She has not taken any over the counter medications. Husband thinks it is allergies.   Denies itchy/watery eyes, runny nose, post nasal drainage. But does say she has seasonal allergies, takes nothing for this.    Denies reflux/heart burn. She had an EGD done in December which was normal. Not taking any acid reflux medications.    I have reviewed the patient's family social and past medical history and updated as appropriate.   Past Medical History:  Diagnosis Date   10 year risk of MI or stroke 7.5% or greater 08/17/2020   16.2%   Anemia    during pregnancies   Cancer (HCC)    pre-cancerous skin lesions on face. surgically removed.   Family history of breast cancer    Family history of uterine cancer    Hypertension    Lung cancer (HCC) 03/2022   Pneumonia    as a child    Past Surgical History:  Procedure Laterality Date   ABDOMINAL HYSTERECTOMY  1997   TOTAL ABDOMINAL HYSTERECTOMY   BREAST SURGERY     BREAST REDUCTION   COLONOSCOPY     11 yrs ago in [place]- was Normal per pt.   cyst removal foot     CYST REMOVAL HAND     INTERCOSTAL NERVE BLOCK Right  04/08/2022   Procedure: INTERCOSTAL NERVE BLOCK;  Surgeon: Corliss Skains, MD;  Location: [facility] OR;  Service: Thoracic;  Laterality: Right;   NODE DISSECTION Right 04/08/2022   Procedure: NODE DISSECTION;  Surgeon: Corliss Skains, MD;  Location: [facility] OR;  Service: Thoracic;  Laterality: Right;   WISDOM TOOTH EXTRACTION      Family History  Problem Relation Age of Onset   Breast cancer Mother 46   Uterine cancer Mother 64   Cancer Father        LUNG AND THROAT - SMOKER   Breast cancer Daughter 22   Cervical cancer Maternal Aunt        dx in her 73s   Congestive Heart Failure Maternal Aunt 80   Uterine cancer Paternal Aunt        dx >50   Cancer Paternal Uncle        LUNG    Cancer Paternal Uncle        LUNG   Cancer Paternal Uncle        LUNG   Cancer Paternal Uncle        LUNG   Kidney failure Maternal Grandfather    Cancer Paternal Grandmother        NOS   Cancer Paternal  Grandfather        lung and throat - smoker   Kidney failure Brother    Colon cancer Neg Hx    Colon polyps Neg Hx     Social History   Occupational History   Not on file  Tobacco Use   Smoking status: Never   Smokeless tobacco: Never  Vaping Use   Vaping status: Never Used  Substance and Sexual Activity   Alcohol use: Yes    Alcohol/week: 0.0 standard drinks of alcohol    Comment: OCC   Drug use: No   Sexual activity: Yes     Physical Exam: Blood pressure (!) 142/94, pulse 63, height 4\' 9"  (1.448 m), weight 131 lb 3.2 oz (59.5 kg), SpO2 99%.  Gen:      No acute distress ENT:  mild cobblestoning,  mucus membranes moist Lungs:    No increased respiratory effort, symmetric chest wall excursion, clear to auscultation bilaterally, no wheezes or crackles CV:         Regular rate and rhythm; no murmurs, rubs, or gallops.  No pedal edema   Data Reviewed: Imaging: I have personally reviewed the CT Chest Feb 2025 - stable post surgical changes from lobectomy, no suspcious noduels or  masses  PFTs:     Latest Ref Rng & Units 03/07/2022   12:29 PM  PFT Results  FVC-Pre L 1.50   FVC-Predicted Pre % 72   FVC-Post L 1.55   FVC-Predicted Post % 74   Pre FEV1/FVC % % 90   Post FEV1/FCV % % 92   FEV1-Pre L 1.34   FEV1-Predicted Pre % 87   FEV1-Post L 1.43   DLCO uncorrected ml/min/mmHg 18.45   DLCO UNC% % 119   DLVA Predicted % 147   TLC L 3.53   TLC % Predicted % 85   RV % Predicted % 82    I have personally reviewed the patient's PFTs and normal pulmonary function  Labs: Lab Results  Component Value Date   NA 140 11/10/2023   K 3.5 11/10/2023   CO2 33 (H) 11/10/2023   GLUCOSE 103 (H) 11/10/2023   BUN 27 (H) 11/10/2023   CREATININE 0.78 11/10/2023   CALCIUM 9.8 11/10/2023   GFR 95.62 12/07/2014   EGFR 84 02/05/2021   GFRNONAA >60 11/10/2023   Lab Results  Component Value Date   WBC 8.4 11/10/2023   HGB 12.9 11/10/2023   HCT 39.4 11/10/2023   MCV 87.8 11/10/2023   PLT 351 11/10/2023    Immunization status: Immunization History  Administered Date(s) Administered   Influenza Split 09/29/2010   Influenza Whole 05/06/2018   Influenza, High Dose Seasonal PF 06/25/2012   Influenza,inj,Quad PF,6+ Mos 08/09/2013, 06/12/2014   Influenza,trivalent, recombinat, inj, PF 09/29/2010   Influenza-Unspecified 06/13/2021   PFIZER(Purple Top)SARS-COV-2 Vaccination 10/19/2019, 11/06/2019, 06/25/2020, 01/02/2021   Pfizer Covid-19 Vaccine Bivalent Booster 66yrs & up 06/13/2021   Pneumococcal Conjugate-13 06/12/2014   Pneumococcal Polysaccharide-23 09/29/2009   Pneumococcal-Unspecified 06/13/2015, 02/06/2021   Tdap 09/29/2006   Zoster, Live 09/29/2009    External Records Personally Reviewed: pulmonary  Assessment:  Seasonal allergic rhinitis not well controlled Cough variant asthma, 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  Plan/Recommendations:   It was a pleasure to meet you today.  I suspect your symptoms of coughing for the last  3 weeks are most likely related to inflammation in your sinuses from seasonal allergies.  I recommend starting Flonase see instructions below.  Your other cough symptoms  have been attributed to may have mild form of cough variant asthma.  I think it is reasonable for you to back down on your Symbicort inhaler to 1 puff twice a day to see if your symptoms are still controlled.  Eventually after a few weeks you can even back down to 1 puff once daily to see if that still controls your chronic cough related to asthma.  If your cough returns certainly I would go back up on this.   Return to Care: No follow-ups on file.   Durel Salts, MD Pulmonary and Critical Care Medicine [facility] [phone]

## 2024-03-04 NOTE — Progress Notes (Unsigned)
 HPI: Erica Greene is a 78 y.o. female with a PMHx significant for HTN, HLD, lung cancer, and GERD, who is here today to establish care.  Former PCP: Maryellen Snare, NP Last preventive routine visit: More than one year ago  Exercise: She does aerobics, water exercises, and walks.  Diet: In general, she says she eats healthy.  Alcohol Use: Infrequent social use.  Smoking: never Vision: UTD on routine vision care.  Dental: UTD on routine dental care.  Chronic medical problems:   Hypertension:  Medications: No longer taking hydrochlorothiazide  50 mg. She stopped taking it in 09/2023. BP readings at home: She checks her BP regularly at home and says it is normally 120s/70s.  She tries to avoid salt in her diet.  Negative for unusual or severe headache, visual changes, exertional chest pain, dyspnea,  focal weakness, or edema.  Lab Results  Component Value Date   NA 140 11/10/2023   CL 101 11/10/2023   K 3.5 11/10/2023   CO2 33 (H) 11/10/2023   BUN 27 (H) 11/10/2023   CREATININE 0.78 11/10/2023   GFRNONAA >60 11/10/2023   CALCIUM 9.8 11/10/2023   ALBUMIN 4.1 11/10/2023   GLUCOSE 103 (H) 11/10/2023   Hyperlipidemia: No longer taking atorvastatin 40 mg.  She discontinued medication in 09/2023, felt it was causing indigestion like symptoms. History of aortic atherosclerosis seen on imaging.  Lab Results  Component Value Date   CHOL 203 (H) 02/05/2021   HDL 75 02/05/2021   LDLCALC 118 (H) 02/05/2021   TRIG 54 02/05/2021   CHOLHDL 2.7 02/05/2021   Hx of end-stage IA non-small cell lung cancer, adenocarcinoma presented with right upper lobe lung nodule.   No history of tobacco use. She follows with pulmonology every 3 months and oncology every 6 months.  Surgical history of XI Robotic assisted thorascopy-right upper lobectomy on 04/08/2022.  States that she did not need chemotherapy or other treatments.  She has had several family members who were smokers who died of lung  cancer.  Cough variant asthma, she uses a Symbicort  160-4.5 mcg inhaler.   She mentions she had an upper endoscopy on 10/02/2023 because she was having problems with swallowing, indigestion, and cough. All these symptoms went away after she stopped taking her medications.  She asks today about her blood pressure and cholesterol.  She would like thyroid  function check.  Has had elevated glucose in the past, up to 150's. No hx of diabetes.  Review of Systems  Constitutional:  Negative for activity change, appetite change, chills and fever.  HENT:  Negative for mouth sores and sore throat.   Respiratory:  Negative for cough and wheezing.   Gastrointestinal:  Negative for abdominal pain, nausea and vomiting.  Endocrine: Negative for cold intolerance, heat intolerance, polydipsia, polyphagia and polyuria.  Genitourinary:  Negative for decreased urine volume, dysuria and hematuria.  Musculoskeletal:  Negative for gait problem and myalgias.  Skin:  Negative for rash.  Allergic/Immunologic: Positive for environmental allergies.  Neurological:  Negative for syncope and facial asymmetry.  Psychiatric/Behavioral:  Negative for confusion and hallucinations.   See other pertinent positives and negatives in HPI.  Current Outpatient Medications on File Prior to Visit  Medication Sig Dispense Refill   acetaminophen  (TYLENOL ) 325 MG tablet Take 2 tablets (650 mg total) by mouth every 4 (four) hours as needed for mild pain or moderate pain.     budesonide -formoterol  (SYMBICORT ) 160-4.5 MCG/ACT inhaler Inhale 2 puffs into the lungs in the morning and at bedtime. (  Patient taking differently: Inhale 2 puffs into the lungs at bedtime.) 1 each 6   Cholecalciferol ([employer] VIT D 5000 HIGH-POTENCY PO) Take 5,000 Units by mouth daily.     Coenzyme Q10 (COQ10 PO) Take 1 tablet by mouth at bedtime.     fluticasone  (FLONASE ) 50 MCG/ACT nasal spray Place 1 spray into both nostrils daily. 16 g 5   Probiotic Product  (PROBIOTIC DAILY PO) Take 1 Capful by mouth daily.     Turmeric 500 MG CAPS Take 500 mg by mouth daily.     No current facility-administered medications on file prior to visit.   Past Medical History:  Diagnosis Date   10 year risk of MI or stroke 7.5% or greater 08/17/2020   16.2%   Anemia    during pregnancies   Cancer (HCC)    pre-cancerous skin lesions on face. surgically removed.   Family history of breast cancer    Family history of uterine cancer    Hypertension    Lung cancer (HCC) 03/2022   Pneumonia    as a child   Allergies  Allergen Reactions   Penicillins Anaphylaxis   Amlodipine Swelling    Gums swell   Lisinopril Swelling    Facial swelling   Sulfa Antibiotics Itching   Latex Rash   Family History  Problem Relation Age of Onset   Breast cancer Mother 66   Uterine cancer Mother 63   Cancer Father        LUNG AND THROAT - SMOKER   Breast cancer Daughter 65   Cervical cancer Maternal Aunt        dx in her 57s   Congestive Heart Failure Maternal Aunt 80   Uterine cancer Paternal Aunt        dx >50   Cancer Paternal Uncle        LUNG    Cancer Paternal Uncle        LUNG   Cancer Paternal Uncle        LUNG   Cancer Paternal Uncle        LUNG   Kidney failure Maternal Grandfather    Cancer Paternal Grandmother        NOS   Cancer Paternal Grandfather        lung and throat - smoker   Kidney failure Brother    Colon cancer Neg Hx    Colon polyps Neg Hx    Social History   Socioeconomic History   Marital status: Married    Spouse name: Axel Lent   Number of children: 2   Years of education: Not on file   Highest education level: Not on file  Occupational History   Not on file  Tobacco Use   Smoking status: Never   Smokeless tobacco: Never  Vaping Use   Vaping status: Never Used  Substance and Sexual Activity   Alcohol use: Yes    Alcohol/week: 0.0 standard drinks of alcohol    Comment: OCC   Drug use: No   Sexual activity: Yes  Other  Topics Concern   Not on file  Social History Narrative   Not on file   Social Drivers of Health   Financial Resource Strain: Not on file  Food Insecurity: Not on file  Transportation Needs: Not on file  Physical Activity: Not on file  Stress: Not on file  Social Connections: Not on file   Vitals:   03/07/24 0722  BP: 136/80  Pulse: 64  Resp: 16  Temp: 98.1 F (36.7 C)  SpO2: 98%   Body mass index is 28.56 kg/m.  Physical Exam Vitals and nursing note reviewed.  Constitutional:      General: She is not in acute distress.    Appearance: She is well-developed.  HENT:     Head: Normocephalic and atraumatic.     Mouth/Throat:     Mouth: Mucous membranes are moist.     Pharynx: Oropharynx is clear. Uvula midline.  Eyes:     Conjunctiva/sclera: Conjunctivae normal.  Cardiovascular:     Rate and Rhythm: Normal rate and regular rhythm.     Pulses:          Dorsalis pedis pulses are 2+ on the right side and 2+ on the left side.     Heart sounds: No murmur heard. Pulmonary:     Effort: Pulmonary effort is normal. No respiratory distress.     Breath sounds: Normal breath sounds.  Abdominal:     Palpations: Abdomen is soft. There is no hepatomegaly or mass.     Tenderness: There is no abdominal tenderness.  Musculoskeletal:     Right lower leg: No edema.     Left lower leg: No edema.  Lymphadenopathy:     Cervical: No cervical adenopathy.  Skin:    General: Skin is warm.     Findings: No erythema or rash.  Neurological:     General: No focal deficit present.     Mental Status: She is alert and oriented to person, place, and time.     Cranial Nerves: No cranial nerve deficit.     Gait: Gait normal.  Psychiatric:        Mood and Affect: Affect normal. Mood is anxious.   ASSESSMENT AND PLAN:  Ms. Rahn was seen today to establish care.   Elevated glucose level No history of diabetes, positive family history. Glucose has been elevated within the past year, max  158. Further recommendation will be given according to hemoglobin A1c result. -     Hemoglobin A1c; Future  Essential hypertension Assessment & Plan: Currently she is on nonpharmacologic treatment. Reporting home BPs 120s/70s. She has not tolerated other antihypertensive medications, amlodipine and lisinopril. Continue monitoring BP regularly. Eye exam is current. As far as problem is stable, she can continue following annually.  Orders: -     TSH; Future  Hyperlipidemia, unspecified hyperlipidemia type Assessment & Plan: Until 09/2023 she was on atorvastatin 40 mg daily, discontinued due to possible side effects, indigestion. Continue nonpharmacologic treatment. Further recommendation will be given according to lipid panel result.  Orders: -     Lipid panel; Future  Return in about 1 year (around 03/07/2025) for chronic problems.  I, Fritz Jewel Wierda, acting as a scribe for Pepe Mineau [name], MD., have documented all relevant documentation on the behalf of Stella Bortle [name], MD, as directed by  Tuwanna Krausz [name], MD while in the presence of Aeliana Spates [name], MD.   I, Kieran Nachtigal [name], MD, have reviewed all documentation for this visit. The documentation on 03/07/24 for the exam, diagnosis, procedures, and orders are all accurate and complete.  Benney Sommerville G. [name], MD  [facility]. [place] office.

## 2024-03-07 ENCOUNTER — Encounter: Payer: Self-pay | Admitting: Family Medicine

## 2024-03-07 ENCOUNTER — Ambulatory Visit (INDEPENDENT_AMBULATORY_CARE_PROVIDER_SITE_OTHER): Admitting: Family Medicine

## 2024-03-07 VITALS — BP 136/80 | HR 64 | Temp 98.1°F | Resp 16 | Ht <= 58 in | Wt 132.0 lb

## 2024-03-07 DIAGNOSIS — E785 Hyperlipidemia, unspecified: Secondary | ICD-10-CM | POA: Diagnosis not present

## 2024-03-07 DIAGNOSIS — I10 Essential (primary) hypertension: Secondary | ICD-10-CM | POA: Diagnosis not present

## 2024-03-07 DIAGNOSIS — R7309 Other abnormal glucose: Secondary | ICD-10-CM

## 2024-03-07 LAB — HEMOGLOBIN A1C: Hgb A1c MFr Bld: 5.4 % (ref 4.6–6.5)

## 2024-03-07 LAB — LIPID PANEL
Cholesterol: 211 mg/dL — ABNORMAL HIGH (ref 0–200)
HDL: 58.8 mg/dL (ref >39.00)
LDL Cholesterol: 140 mg/dL — ABNORMAL HIGH (ref 0–99)
NonHDL: 151.91
Total CHOL/HDL Ratio: 4
Triglycerides: 62 mg/dL (ref 0.0–149.0)
VLDL: 12.4 mg/dL (ref 0.0–40.0)

## 2024-03-07 NOTE — Assessment & Plan Note (Signed)
 Until 09/2023 she was on atorvastatin 40 mg daily, discontinued due to possible side effects, "indigestion." Continue nonpharmacologic treatment. Further recommendation will be given according to lipid panel result.

## 2024-03-07 NOTE — Assessment & Plan Note (Signed)
 Currently she is on nonpharmacologic treatment. Reporting home BPs 120s/70s. She has not tolerated other antihypertensive medications, amlodipine and lisinopril. Continue monitoring BP regularly. Eye exam is current. As far as problem is stable, she can continue following annually.

## 2024-03-07 NOTE — Patient Instructions (Addendum)
 A few things to remember from today's visit:  Elevated glucose level - Plan: Hemoglobin A1c  Essential hypertension - Plan: TSH  Hyperlipidemia, unspecified hyperlipidemia type - Plan: Lipid panel  Continue monitoring blood pressure regularly, goal under 140/90.  Do not use My Chart to request refills or for acute issues that need immediate attention. If you send a my chart message, it may take a few days to be addressed, specially if I am not in the office.  Please be sure medication list is accurate. If a new problem present, please set up appointment sooner than planned today.

## 2024-03-10 LAB — TSH: TSH: 0.89 u[IU]/mL (ref 0.35–5.50)

## 2024-03-20 ENCOUNTER — Ambulatory Visit: Payer: Self-pay | Admitting: Family Medicine

## 2024-04-12 ENCOUNTER — Ambulatory Visit (INDEPENDENT_AMBULATORY_CARE_PROVIDER_SITE_OTHER): Admitting: Family Medicine

## 2024-04-12 VITALS — BP 128/72

## 2024-04-12 DIAGNOSIS — Z0000 Encounter for general adult medical examination without abnormal findings: Secondary | ICD-10-CM

## 2024-04-12 NOTE — Patient Instructions (Signed)
 I really enjoyed getting to talk with you today! I am available on Tuesdays and Thursdays for virtual visits if you have any questions or concerns, or if I can be of any further assistance.   CHECKLIST FROM ANNUAL WELLNESS VISIT:  -Follow up (please call to schedule if not scheduled after visit):   -yearly for annual wellness visit with primary care office  Here is a list of your preventive care/health maintenance measures and the plan for each if any are due:  PLAN For any measures below that may be due:    1. Get mammogram and bone density as planned, please provide copies to Dr. [name]. Thanks!   2. Can get the Tdap, shingles and covid and flu vaccines at the pharmacy. Please let us  know when you do so that we can update your record.   Health Maintenance  Topic Date Due   COVID-19 Vaccine (6 - 2024-25 season) 05/31/2023   Zoster Vaccines- Shingrix (1 of 2) 06/07/2024 (Originally 01/13/1965)   DTaP/Tdap/Td (2 - Td or Tdap) 03/07/2025 (Originally 09/29/2016)   INFLUENZA VACCINE  04/29/2024   Medicare Annual Wellness (AWV)  04/12/2025   Pneumococcal Vaccine: 50+ Years (3 of 3 - PCV20 or PCV21) 02/06/2026   DEXA SCAN  Completed   Hepatitis C Screening  Completed   Hepatitis B Vaccines  Aged Out   HPV VACCINES  Aged Out   Meningococcal B Vaccine  Aged Out   Colonoscopy  Discontinued    -See a dentist at least yearly  -Get your eyes checked and then per your eye specialist's recommendations  -Other issues addressed today:   -I have included below further information regarding a healthy whole foods based diet, physical activity guidelines for adults, stress management and opportunities for social connections. I hope you find this information useful.    -----------------------------------------------------------------------------------------------------------------------------------------------------------------------------------------------------------------------------------------------------------    NUTRITION: -eat real food: lots of colorful vegetables (half the plate) and fruits -5-7 servings of vegetables and fruits per day (fresh or steamed is best), exp. 2 servings of vegetables with lunch and dinner and 2 servings of fruit per day. Berries and greens such as kale and collards are great choices.  -consume on a regular basis:  fresh fruits, fresh veggies, fish, nuts, seeds, healthy oils (such as olive oil, avocado oil), whole grains (make sure for bread/pasta/crackers/etc., that the first ingredient on label contains the word whole), legumes. -can eat small amounts of dairy and lean meat (no larger than the palm of your hand), but avoid processed meats such as ham, bacon, lunch meat, etc. -drink water -try to avoid fast food and pre-packaged foods, processed meat, ultra processed foods/beverages (donuts, candy, etc.) -most experts advise limiting sodium to < 2300mg  per day, should limit further is any chronic conditions such as high blood pressure, heart disease, diabetes, etc. The American Heart Association advised that < 1500mg  is is ideal -try to avoid foods/beverages that contain any ingredients with names you do not recognize  -try to avoid foods/beverages  with added sugar or sweeteners/sweets  -try to avoid sweet drinks (including diet drinks): soda, juice, Gatorade, sweet tea, power drinks, diet drinks -try to avoid white rice, white bread, pasta (unless whole grain)  EXERCISE GUIDELINES FOR ADULTS: -if you wish to increase your physical activity, do so gradually and with the approval of your doctor -STOP and seek medical care immediately if you have any chest pain, chest discomfort or trouble breathing when starting or  increasing exercise  -move and stretch your body, legs,  feet and arms when sitting for long periods -Physical activity guidelines for optimal health in adults: -get at least 150 minutes per week of moderate exercise (can talk, but not sing); this is about 20-30 minutes of sustained activity 5-7 days per week or two 10-15 minute episodes of sustained activity 5-7 days per week -do some muscle building/resistance training/strength training at least 2 days per week  -balance exercises 3+ days per week:   Stand somewhere where you have something sturdy to hold onto if you lose balance    1) lift up on toes, then back down, start with 5x per day and work up to 20x   2) stand and lift one leg straight out to the side so that foot is a few inches of the floor, start with 5x each side and work up to 20x each side   3) stand on one foot, start with 5 seconds each side and work up to 20 seconds on each side  If you need ideas or help with getting more active:  -Silver sneakers https://tools.silversneakers.com  -Walk with a Doc: http://www.duncan-Blazina.com/  -try to include resistance (weight lifting/strength building) and balance exercises twice per week: or the following link for ideas: http://castillo-powell.com/  BuyDucts.dk  STRESS MANAGEMENT: -can try meditating, or just sitting quietly with deep breathing while intentionally relaxing all parts of your body for 5 minutes daily -if you need further help with stress, anxiety or depression please follow up with your primary doctor or contact the wonderful folks at [facility]: [phone]  SOCIAL CONNECTIONS: -options in [place] if you wish to engage in more social and exercise related activities:  -Silver sneakers https://tools.silversneakers.com  -Walk with a Doc: http://www.duncan-Esguerra.com/  -Check out the [facility] Active Adults 50+  section on the [place] [employer] (hiking clubs, book clubs, cards and games, chess, exercise classes, aquatic classes and much more) - see the website for details: https://www.[place].gov/departments/parks-recreation/active-adults50  -YouTube has lots of exercise videos for different ages and abilities as well  -[facility] (a variety of indoor and outdoor inperson activities for adults). [phone]. [address]  -Virtual Online Classes (a variety of topics): see seniorplanet.org or call ([phone]  -consider volunteering at a school, hospice center, church, senior center or elsewhere

## 2024-04-12 NOTE — Progress Notes (Signed)
 PATIENT CHECK-IN and HEALTH RISK ASSESSMENT QUESTIONNAIRE:  -completed by phone/video for upcoming Medicare Preventive Visit  Pre-Visit Check-in: 1)Vitals (height, wt, BP, etc) - record in vitals section for visit on day of visit Request home vitals (wt, BP, etc.) and enter into vitals, THEN update Vital Signs SmartPhrase below at the top of the HPI. See below.  2)Review and Update Medications, Allergies PMH, Surgeries, Social history in Epic 3)Hospitalizations in the last year with date/reason? n  4)Review and Update Care Team (patient's specialists) in Epic 5) Complete PHQ9 in Epic  6) Complete Fall Screening in Epic 7)Review all Health Maintenance Due and order under PCP if not done.  Medicare Wellness Patient Questionnaire:  Answer theses question about your habits: How often do you have a drink containing alcohol? Less than once a month How many drinks containing alcohol do you have on a typical day when you are drinking?1 How often do you have six or more drinks on one occasion?n Have you ever smoked?n Quit date if applicable? na  How many packs a day do/did you smoke? n Do you use smokeless tobacco?n Do you use an illicit drugs?n On average, how many days per week do you engage in moderate to strenuous exercise (like a brisk walk)?2-3 times per week On average, how many minutes do you engage in exercise at this level? Low impact exercise and [facility] - 45 minutes each visit Typical diet: watches salt and sugar, eats lots of veggies with all meals, proteins Sleep: has trouble falling asleep, racing thoughts, thinking about next day, wakes up some thru the night to go to the bath-room and then has difficulty falling back asleep  Answer theses question about your everyday activities: Can you perform most household chores?y Are you deaf or have significant trouble hearing?n Do you feel that you have a problem with memory?n Do you feel safe at  home?y Last dentist visit? Went a few weeks ago 8. Do you have any difficulty performing your everyday activities?n Are you having any difficulty walking, taking medications on your own, and or difficulty managing daily home needs?n Do you have difficulty walking or climbing stairs?n Do you have difficulty dressing or bathing?n Do you have difficulty doing errands alone such as visiting a doctor's office or shopping?n Do you currently have any difficulty preparing food and eating?n Do you currently have any difficulty using the toilet?n Do you have any difficulty managing your finances?n Do you have any difficulties with housekeeping of managing your housekeeping?n   Do you have Advanced Directives in place (Living Will, Healthcare Power or Attorney)? y   Last eye Exam and location? Went last month, Dr. Octavia   Do you currently use prescribed or non-prescribed narcotic or opioid pain medications?n  Do you have a history or close family history of breast, ovarian, tubal or peritoneal cancer or a family member with BRCA (breast cancer susceptibility 1 and 2) gene mutations?lung cancer, daughter had breast ca     ----------------------------------------------------------------------------------------------------------------------------------------------------------------------------------------------------------------------  Because this visit was a virtual/telehealth visit, some criteria may be missing or patient reported. Any vitals not documented were not able to be obtained and vitals that have been documented are patient reported.    MEDICARE ANNUAL PREVENTIVE VISIT WITH PROVIDER: (Welcome to Medicare, initial annual wellness or annual wellness exam)  Virtual Visit via Video Note  I connected with Erica Greene on 04/12/24 by a video enabled telemedicine application and verified that I am speaking with the correct  person using two identifiers.  Location patient: home Location  provider:work or home office Persons participating in the virtual visit: patient, provider  Concerns and/or follow up today: reports all has been stable. Reports checking BP daily   See HM section in Epic for other details of completed HM.    ROS: negative for report of fevers, unintentional weight loss, vision changes, vision loss, hearing loss or change, chest pain, sob, hemoptysis, melena, hematochezia, hematuria, falls, bleeding or bruising, thoughts of suicide or self harm, memory loss  Patient-completed extensive health risk assessment - reviewed and discussed with the patient: See Health Risk Assessment completed with patient prior to the visit either above or in recent phone note. This was reviewed in detailed with the patient today and appropriate recommendations, orders and referrals were placed as needed per Summary below and patient instructions.   Review of Medical History: -PMH, PSH, Family History and current specialty and care providers reviewed and updated and listed below   Patient Care Team: [name], [name], MD as PCP - General (Family Medicine)   Past Medical History:  Diagnosis Date   10 year risk of MI or stroke 7.5% or greater 08/17/2020   16.2%   Anemia    during pregnancies   Cancer (HCC)    pre-cancerous skin lesions on face. surgically removed.   Family history of breast cancer    Family history of uterine cancer    Hypertension    Lung cancer (HCC) 03/2022   Pneumonia    as a child    Past Surgical History:  Procedure Laterality Date   ABDOMINAL HYSTERECTOMY  1997   TOTAL ABDOMINAL HYSTERECTOMY   BREAST SURGERY     BREAST REDUCTION   COLONOSCOPY     11 yrs ago in [place] - was Normal per pt.   cyst removal foot     CYST REMOVAL HAND     INTERCOSTAL NERVE BLOCK Right 04/08/2022   Procedure: INTERCOSTAL NERVE BLOCK;  Surgeon: Shyrl Linnie KIDD, MD;  Location: [facility] OR;  Service: Thoracic;  Laterality: Right;   NODE DISSECTION Right 04/08/2022    Procedure: NODE DISSECTION;  Surgeon: Shyrl Linnie KIDD, MD;  Location: [facility] OR;  Service: Thoracic;  Laterality: Right;   WISDOM TOOTH EXTRACTION      Social History   Socioeconomic History   Marital status: Married    Spouse name: Augusta   Number of children: 2   Years of education: Not on file   Highest education level: Not on file  Occupational History   Not on file  Tobacco Use   Smoking status: Never   Smokeless tobacco: Never  Vaping Use   Vaping status: Never Used  Substance and Sexual Activity   Alcohol use: Yes    Alcohol/week: 0.0 standard drinks of alcohol    Comment: OCC   Drug use: No   Sexual activity: Yes  Other Topics Concern   Not on file  Social History Narrative   Not on file   Social Drivers of Health   Financial Resource Strain: Not on file  Food Insecurity: Not on file  Transportation Needs: Not on file  Physical Activity: Not on file  Stress: Not on file  Social Connections: Not on file  Intimate Partner Violence: Not on file    Family History  Problem Relation Age of Onset   Breast cancer Mother 57   Uterine cancer Mother 43   Cancer Father        LUNG AND THROAT -  SMOKER   Breast cancer Daughter 65   Cervical cancer Maternal Aunt        dx in her 39s   Congestive Heart Failure Maternal Aunt 80   Uterine cancer Paternal Aunt        dx >50   Cancer Paternal Uncle        LUNG    Cancer Paternal Uncle        LUNG   Cancer Paternal Uncle        LUNG   Cancer Paternal Uncle        LUNG   Kidney failure Maternal Grandfather    Cancer Paternal Grandmother        NOS   Cancer Paternal Grandfather        lung and throat - smoker   Kidney failure Brother    Colon cancer Neg Hx    Colon polyps Neg Hx     Current Outpatient Medications on File Prior to Visit  Medication Sig Dispense Refill   acetaminophen  (TYLENOL ) 325 MG tablet Take 2 tablets (650 mg total) by mouth every 4 (four) hours as needed for mild pain or moderate pain.      budesonide -formoterol  (SYMBICORT ) 160-4.5 MCG/ACT inhaler Inhale 2 puffs into the lungs in the morning and at bedtime. (Patient taking differently: Inhale 2 puffs into the lungs at bedtime.) 1 each 6   Cholecalciferol ([employer] VIT D 5000 HIGH-POTENCY PO) Take 5,000 Units by mouth daily.     Coenzyme Q10 (COQ10 PO) Take 1 tablet by mouth at bedtime.     fluticasone  (FLONASE ) 50 MCG/ACT nasal spray Place 1 spray into both nostrils daily. 16 g 5   Probiotic Product (PROBIOTIC DAILY PO) Take 1 Capful by mouth daily.     Turmeric 500 MG CAPS Take 500 mg by mouth daily.     No current facility-administered medications on file prior to visit.    Allergies  Allergen Reactions   Penicillins Anaphylaxis   Amlodipine Swelling    Gums swell   Lisinopril Swelling    Facial swelling   Sulfa Antibiotics Itching   Latex Rash       Physical Exam Vitals requested from patient and listed below if patient had equipment and was able to obtain at home for this virtual visit: Vitals:   04/12/24 1556  BP: 128/72   Estimated body mass index is 28.56 kg/m as calculated from the following:   Height as of 03/07/24: 4' 9 (1.448 m).   Weight as of 03/07/24: 132 lb (59.9 kg).  EKG (optional): deferred due to virtual visit  GENERAL: alert, oriented, no acute distress detected, full vision exam deferred due to pandemic and/or virtual encounter   HEENT: atraumatic, conjunttiva clear, no obvious abnormalities on inspection of external nose and ears  NECK: normal movements of the head and neck  LUNGS: on inspection no signs of respiratory distress, breathing rate appears normal, no obvious gross SOB, gasping or wheezing  CV: no obvious cyanosis  MS: moves all visible extremities without noticeable abnormality  PSYCH/NEURO: pleasant and cooperative, no obvious depression or anxiety, speech and thought processing grossly intact, Cognitive function grossly intact        04/12/2024    4:00 PM 03/07/2024     7:33 AM 02/05/2021    9:04 AM 08/16/2020   10:55 AM 06/12/2014   10:12 AM  Depression screen PHQ 2/9  Decreased Interest 0 0 0 0 0  Down, Depressed, Hopeless 0 0 0 0 0  PHQ - 2 Score 0 0 0 0 0       04/11/2022    8:00 AM 04/11/2022    7:23 PM 04/12/2022    8:30 AM 03/07/2024    7:33 AM 04/12/2024    3:59 PM  Fall Risk  Falls in the past year?    0 0  Was there an injury with Fall?    0 0  Fall Risk Category Calculator    0 0  (RETIRED) Patient Fall Risk Level Moderate fall risk  Moderate fall risk  Moderate fall risk     Patient at Risk for Falls Due to    Other (Comment)   Fall risk Follow up    Falls evaluation completed Falls evaluation completed     Data saved with a previous flowsheet row definition     SUMMARY AND PLAN:  Encounter for Medicare annual wellness exam  Discussed applicable health maintenance/preventive health measures and advised and referred or ordered per patient preferences: -will be getting mammogram and bone density test later this month - does with Dr. Latisha -discussed vaccines recs/risks, she plans to do at the pharmacy Health Maintenance  Topic Date Due   COVID-19 Vaccine (6 - 2024-25 season) 05/31/2023   Zoster Vaccines- Shingrix (1 of 2) 06/07/2024 (Originally 01/13/1965)   DTaP/Tdap/Td (2 - Td or Tdap) 03/07/2025 (Originally 09/29/2016)   INFLUENZA VACCINE  04/29/2024   Medicare Annual Wellness (AWV)  04/12/2025   Pneumococcal Vaccine: 50+ Years (3 of 3 - PCV20 or PCV21) 02/06/2026   DEXA SCAN  Completed   Hepatitis C Screening  Completed   Hepatitis B Vaccines  Aged Out   HPV VACCINES  Aged Out   Meningococcal B Vaccine  Aged Out   Colonoscopy  Discontinued      Education and counseling on the following was provided based on the above review of health and a plan/checklist for the patient, along with additional information discussed, was provided for the patient in the patient instructions :   -Advised and counseled on a healthy lifestyle -  including the importance of a healthy diet, regular physical activity, social connections and stress management. -Reviewed patient's current diet. Advised and counseled on a whole foods based healthy diet. A summary of a healthy diet was provided in the Patient Instructions.  -reviewed patient's current physical activity level and discussed exercise guidelines for adults. Discussed community resources and ideas for safe exercise at home to assist in meeting exercise guideline recommendations in a safe and healthy way.  -Advise yearly dental visits at minimum and regular eye exams   Follow up: see patient instructions     Patient Instructions  I really enjoyed getting to talk with you today! I am available on Tuesdays and Thursdays for virtual visits if you have any questions or concerns, or if I can be of any further assistance.   CHECKLIST FROM ANNUAL WELLNESS VISIT:  -Follow up (please call to schedule if not scheduled after visit):   -yearly for annual wellness visit with primary care office  Here is a list of your preventive care/health maintenance measures and the plan for each if any are due:  PLAN For any measures below that may be due:    1. Get mammogram and bone density as planned, please provide copies to Dr. [name]. Thanks!   2. Can get the Tdap, shingles and covid and flu vaccines at the pharmacy. Please let us  know when you do so that we can update your record.  Health Maintenance  Topic Date Due   COVID-19 Vaccine (6 - 2024-25 season) 05/31/2023   Zoster Vaccines- Shingrix (1 of 2) 06/07/2024 (Originally 01/13/1965)   DTaP/Tdap/Td (2 - Td or Tdap) 03/07/2025 (Originally 09/29/2016)   INFLUENZA VACCINE  04/29/2024   Medicare Annual Wellness (AWV)  04/12/2025   Pneumococcal Vaccine: 50+ Years (3 of 3 - PCV20 or PCV21) 02/06/2026   DEXA SCAN  Completed   Hepatitis C Screening  Completed   Hepatitis B Vaccines  Aged Out   HPV VACCINES  Aged Out   Meningococcal B  Vaccine  Aged Out   Colonoscopy  Discontinued    -See a dentist at least yearly  -Get your eyes checked and then per your eye specialist's recommendations  -Other issues addressed today:   -I have included below further information regarding a healthy whole foods based diet, physical activity guidelines for adults, stress management and opportunities for social connections. I hope you find this information useful.   -----------------------------------------------------------------------------------------------------------------------------------------------------------------------------------------------------------------------------------------------------------    NUTRITION: -eat real food: lots of colorful vegetables (half the plate) and fruits -5-7 servings of vegetables and fruits per day (fresh or steamed is best), exp. 2 servings of vegetables with lunch and dinner and 2 servings of fruit per day. Berries and greens such as kale and collards are great choices.  -consume on a regular basis:  fresh fruits, fresh veggies, fish, nuts, seeds, healthy oils (such as olive oil, avocado oil), whole grains (make sure for bread/pasta/crackers/etc., that the first ingredient on label contains the word whole), legumes. -can eat small amounts of dairy and lean meat (no larger than the palm of your hand), but avoid processed meats such as ham, bacon, lunch meat, etc. -drink water -try to avoid fast food and pre-packaged foods, processed meat, ultra processed foods/beverages (donuts, candy, etc.) -most experts advise limiting sodium to < 2300mg  per day, should limit further is any chronic conditions such as high blood pressure, heart disease, diabetes, etc. The American Heart Association advised that < 1500mg  is is ideal -try to avoid foods/beverages that contain any ingredients with names you do not recognize  -try to avoid foods/beverages  with added sugar or sweeteners/sweets  -try to avoid  sweet drinks (including diet drinks): soda, juice, Gatorade, sweet tea, power drinks, diet drinks -try to avoid white rice, white bread, pasta (unless whole grain)  EXERCISE GUIDELINES FOR ADULTS: -if you wish to increase your physical activity, do so gradually and with the approval of your doctor -STOP and seek medical care immediately if you have any chest pain, chest discomfort or trouble breathing when starting or increasing exercise  -move and stretch your body, legs, feet and arms when sitting for long periods -Physical activity guidelines for optimal health in adults: -get at least 150 minutes per week of moderate exercise (can talk, but not sing); this is about 20-30 minutes of sustained activity 5-7 days per week or two 10-15 minute episodes of sustained activity 5-7 days per week -do some muscle building/resistance training/strength training at least 2 days per week  -balance exercises 3+ days per week:   Stand somewhere where you have something sturdy to hold onto if you lose balance    1) lift up on toes, then back down, start with 5x per day and work up to 20x   2) stand and lift one leg straight out to the side so that foot is a few inches of the floor, start with 5x each side and work up to 20x each  side   3) stand on one foot, start with 5 seconds each side and work up to 20 seconds on each side  If you need ideas or help with getting more active:  -Silver sneakers https://tools.silversneakers.com  -Walk with a Doc: http://www.duncan-Falcon.com/  -try to include resistance (weight lifting/strength building) and balance exercises twice per week: or the following link for ideas: http://castillo-powell.com/  BuyDucts.dk  STRESS MANAGEMENT: -can try meditating, or just sitting quietly with deep breathing while intentionally relaxing all parts of your body for 5 minutes daily -if you need  further help with stress, anxiety or depression please follow up with your primary doctor or contact the wonderful folks at [facility]: [phone]  SOCIAL CONNECTIONS: -options in [place] if you wish to engage in more social and exercise related activities:  -Silver sneakers https://tools.silversneakers.com  -Walk with a Doc: http://www.duncan-Vandivier.com/  -Check out the [facility] Active Adults 50+ section on the [place] [employer] (hiking clubs, book clubs, cards and games, chess, exercise classes, aquatic classes and much more) - see the website for details: https://www.[place]-.gov/departments/parks-recreation/active-adults50  -YouTube has lots of exercise videos for different ages and abilities as well  -[facility] (a variety of indoor and outdoor inperson activities for adults). [phone]. [address]  -Virtual Online Classes (a variety of topics): see seniorplanet.org or call [phone]) [phone]  -consider volunteering at a school, hospice center, church, senior center or elsewhere           Chiquita JONELLE Cramp, DO

## 2024-04-22 ENCOUNTER — Ambulatory Visit: Payer: Medicare Other

## 2024-05-09 ENCOUNTER — Ambulatory Visit (HOSPITAL_COMMUNITY)
Admission: RE | Admit: 2024-05-09 | Discharge: 2024-05-09 | Disposition: A | Discharge status: Home / Self Care | Payer: Medicare Other | Source: Ambulatory Visit | Attending: Internal Medicine | Admitting: Internal Medicine

## 2024-05-09 ENCOUNTER — Inpatient Hospital Stay: Payer: Medicare Other | Attending: Internal Medicine

## 2024-05-09 DIAGNOSIS — Z880 Allergy status to penicillin: Secondary | ICD-10-CM | POA: Diagnosis not present

## 2024-05-09 DIAGNOSIS — Z79899 Other long term (current) drug therapy: Secondary | ICD-10-CM | POA: Insufficient documentation

## 2024-05-09 DIAGNOSIS — C3490 Malignant neoplasm of unspecified part of unspecified bronchus or lung: Secondary | ICD-10-CM | POA: Insufficient documentation

## 2024-05-09 DIAGNOSIS — Z8049 Family history of malignant neoplasm of other genital organs: Secondary | ICD-10-CM | POA: Diagnosis not present

## 2024-05-09 DIAGNOSIS — I10 Essential (primary) hypertension: Secondary | ICD-10-CM | POA: Diagnosis not present

## 2024-05-09 DIAGNOSIS — C3411 Malignant neoplasm of upper lobe, right bronchus or lung: Secondary | ICD-10-CM | POA: Insufficient documentation

## 2024-05-09 DIAGNOSIS — Z888 Allergy status to other drugs, medicaments and biological substances status: Secondary | ICD-10-CM | POA: Insufficient documentation

## 2024-05-09 DIAGNOSIS — Z882 Allergy status to sulfonamides status: Secondary | ICD-10-CM | POA: Insufficient documentation

## 2024-05-09 DIAGNOSIS — I700 Atherosclerosis of aorta: Secondary | ICD-10-CM | POA: Insufficient documentation

## 2024-05-09 DIAGNOSIS — R0609 Other forms of dyspnea: Secondary | ICD-10-CM | POA: Diagnosis not present

## 2024-05-09 DIAGNOSIS — Z902 Acquired absence of lung [part of]: Secondary | ICD-10-CM | POA: Insufficient documentation

## 2024-05-09 DIAGNOSIS — R0600 Dyspnea, unspecified: Secondary | ICD-10-CM | POA: Diagnosis not present

## 2024-05-09 DIAGNOSIS — Z803 Family history of malignant neoplasm of breast: Secondary | ICD-10-CM | POA: Diagnosis not present

## 2024-05-09 DIAGNOSIS — Z90710 Acquired absence of both cervix and uterus: Secondary | ICD-10-CM | POA: Diagnosis not present

## 2024-05-09 DIAGNOSIS — R519 Headache, unspecified: Secondary | ICD-10-CM | POA: Insufficient documentation

## 2024-05-09 LAB — CMP (CANCER CENTER ONLY)
ALT: 14 U/L (ref 0–44)
AST: 17 U/L (ref 15–41)
Albumin: 4.2 g/dL (ref 3.5–5.0)
Alkaline Phosphatase: 54 U/L (ref 38–126)
Anion gap: 4 — ABNORMAL LOW (ref 5–15)
BUN: 26 mg/dL — ABNORMAL HIGH (ref 8–23)
CO2: 31 mmol/L (ref 22–32)
Calcium: 9.4 mg/dL (ref 8.9–10.3)
Chloride: 106 mmol/L (ref 98–111)
Creatinine: 0.83 mg/dL (ref 0.44–1.00)
GFR, Estimated: >60 mL/min (ref >60)
Glucose, Bld: 87 mg/dL (ref 70–99)
Potassium: 4.6 mmol/L (ref 3.5–5.1)
Sodium: 141 mmol/L (ref 135–145)
Total Bilirubin: 0.5 mg/dL (ref 0.0–1.2)
Total Protein: 7.2 g/dL (ref 6.5–8.1)

## 2024-05-09 LAB — CBC WITH DIFFERENTIAL (CANCER CENTER ONLY)
Abs Immature Granulocytes: 0.04 K/uL (ref 0.00–0.07)
Basophils Absolute: 0 K/uL (ref 0.0–0.1)
Basophils Relative: 1 %
Eosinophils Absolute: 0.1 K/uL (ref 0.0–0.5)
Eosinophils Relative: 2 %
HCT: 42.1 % (ref 36.0–46.0)
Hemoglobin: 13.6 g/dL (ref 12.0–15.0)
Immature Granulocytes: 1 %
Lymphocytes Relative: 36 %
Lymphs Abs: 2.6 K/uL (ref 0.7–4.0)
MCH: 28 pg (ref 26.0–34.0)
MCHC: 32.3 g/dL (ref 30.0–36.0)
MCV: 86.8 fL (ref 80.0–100.0)
Monocytes Absolute: 0.5 K/uL (ref 0.1–1.0)
Monocytes Relative: 6 %
Neutro Abs: 4 K/uL (ref 1.7–7.7)
Neutrophils Relative %: 54 %
Platelet Count: 370 K/uL (ref 150–400)
RBC: 4.85 MIL/uL (ref 3.87–5.11)
RDW: 14.1 % (ref 11.5–15.5)
WBC Count: 7.3 K/uL (ref 4.0–10.5)
nRBC: 0 % (ref 0.0–0.2)

## 2024-05-09 MED ORDER — IOHEXOL 300 MG/ML  SOLN
100.0000 mL | Freq: Once | INTRAMUSCULAR | Status: AC | PRN
  Administered 2024-05-09 (×2): 75 mL via INTRAVENOUS

## 2024-05-16 ENCOUNTER — Inpatient Hospital Stay (HOSPITAL_BASED_OUTPATIENT_CLINIC_OR_DEPARTMENT_OTHER): Payer: Medicare Other | Admitting: Internal Medicine

## 2024-05-16 VITALS — BP 155/75 | HR 71 | Temp 98.0°F | Resp 16 | Ht <= 58 in | Wt 134.0 lb

## 2024-05-16 DIAGNOSIS — C3490 Malignant neoplasm of unspecified part of unspecified bronchus or lung: Secondary | ICD-10-CM

## 2024-05-16 DIAGNOSIS — C3411 Malignant neoplasm of upper lobe, right bronchus or lung: Secondary | ICD-10-CM | POA: Diagnosis not present

## 2024-05-16 NOTE — Progress Notes (Signed)
 [facility] Telephone:([phone]) [phone]   Fax:([phone]) [phone]  OFFICE PROGRESS NOTE  [name], [name], MD [address] [place] [address]  DIAGNOSIS: Stage IA (T1b, N0, M0) non-small cell lung cancer, adenocarcinoma presented with right upper lobe lung nodule  PRIOR THERAPY:  Status post right upper lobectomy with lymph node sampling under the care of Dr. Shyrl on 04/08/2022. The tumor size was 1.2 cm with no evidence for metastasis to the lymph node or visceral pleural or lymphovascular invasion   CURRENT THERAPY: Observation  INTERVAL HISTORY: Erica Greene 78 y.o. female returns to the clinic today for 68-month follow-up visit accompanied by her husband.  Discussed the use of AI scribe software for clinical note transcription with the patient, who gave verbal consent to proceed.  History of Present Illness Erica Greene is a 78 year old female with stage 1A non-small cell lung cancer who presents for evaluation with repeat CT scan. She is accompanied by her husband.  She has a history of stage 1A non-small cell lung cancer, adenocarcinoma, and underwent a right upper lobectomy with lymph node sampling on April 08, 2022. She has been on observation since the surgery and is here for a follow-up evaluation with a repeat CT scan to assess for any changes in her condition.  Over the past six months, there have been no significant changes in her health status. No cough, recent weight loss, or night sweats. She notes occasional breathing difficulties upon exertion and has gained two pounds. No nausea, vomiting, or diarrhea.  She experiences morning headaches, which she attributes to sinus issues, with no other associated symptoms.  Previous scans included a mediastinal lymph node that, according to prior reports, was not active on the PET scan.    MEDICAL HISTORY: Past Medical History:  Diagnosis Date   10 year risk of MI or stroke 7.5% or greater 08/17/2020    16.2%   Anemia    during pregnancies   Cancer (HCC)    pre-cancerous skin lesions on face. surgically removed.   Family history of breast cancer    Family history of uterine cancer    Hypertension    Lung cancer (HCC) 03/2022   Pneumonia    as a child    ALLERGIES:  is allergic to penicillins, amlodipine, lisinopril, sulfa antibiotics, and latex.  MEDICATIONS:  Current Outpatient Medications  Medication Sig Dispense Refill   acetaminophen  (TYLENOL ) 325 MG tablet Take 2 tablets (650 mg total) by mouth every 4 (four) hours as needed for mild pain or moderate pain.     budesonide -formoterol  (SYMBICORT ) 160-4.5 MCG/ACT inhaler Inhale 2 puffs into the lungs in the morning and at bedtime. (Patient taking differently: Inhale 2 puffs into the lungs at bedtime.) 1 each 6   Cholecalciferol ([employer] VIT D 5000 HIGH-POTENCY PO) Take 5,000 Units by mouth daily.     Coenzyme Q10 (COQ10 PO) Take 1 tablet by mouth at bedtime.     fluticasone  (FLONASE ) 50 MCG/ACT nasal spray Place 1 spray into both nostrils daily. 16 g 5   Probiotic Product (PROBIOTIC DAILY PO) Take 1 Capful by mouth daily.     Turmeric 500 MG CAPS Take 500 mg by mouth daily.     No current facility-administered medications for this visit.    SURGICAL HISTORY:  Past Surgical History:  Procedure Laterality Date   ABDOMINAL HYSTERECTOMY  1997   TOTAL ABDOMINAL HYSTERECTOMY   BREAST SURGERY     BREAST REDUCTION  COLONOSCOPY     11 yrs ago in [place] - was Normal per pt.   cyst removal foot     CYST REMOVAL HAND     INTERCOSTAL NERVE BLOCK Right 04/08/2022   Procedure: INTERCOSTAL NERVE BLOCK;  Surgeon: Shyrl Linnie KIDD, MD;  Location: [facility] OR;  Service: Thoracic;  Laterality: Right;   NODE DISSECTION Right 04/08/2022   Procedure: NODE DISSECTION;  Surgeon: Shyrl Linnie KIDD, MD;  Location: [facility] OR;  Service: Thoracic;  Laterality: Right;   WISDOM TOOTH EXTRACTION      REVIEW OF SYSTEMS:  Constitutional: negative Eyes:  negative Ears, nose, mouth, throat, and face: negative Respiratory: positive for dyspnea on exertion Cardiovascular: negative Gastrointestinal: negative Genitourinary:negative Integument/breast: negative Hematologic/lymphatic: negative Musculoskeletal:negative Neurological: negative Behavioral/Psych: negative Endocrine: negative Allergic/Immunologic: negative   PHYSICAL EXAMINATION: General appearance: alert, cooperative, and no distress Head: Normocephalic, without obvious abnormality, atraumatic Neck: no adenopathy, no JVD, supple, symmetrical, trachea midline, and thyroid  not enlarged, symmetric, no tenderness/mass/nodules Lymph nodes: Cervical, supraclavicular, and axillary nodes normal. Resp: clear to auscultation bilaterally Back: symmetric, no curvature. ROM normal. No CVA tenderness. Cardio: regular rate and rhythm, S1, S2 normal, no murmur, click, rub or gallop GI: soft, non-tender; bowel sounds normal; no masses,  no organomegaly Extremities: extremities normal, atraumatic, no cyanosis or edema Neurologic: Alert and oriented X 3, normal strength and tone. Normal symmetric reflexes. Normal coordination and gait  ECOG PERFORMANCE STATUS: 1 - Symptomatic but completely ambulatory  Blood pressure (!) 155/75, pulse 71, temperature 98 F (36.7 C), temperature source Temporal, resp. rate 16, height 4' 9 (1.448 m), weight 134 lb (60.8 kg), SpO2 100%.  LABORATORY DATA: Lab Results  Component Value Date   WBC 7.3 05/09/2024   HGB 13.6 05/09/2024   HCT 42.1 05/09/2024   MCV 86.8 05/09/2024   PLT 370 05/09/2024      Chemistry      Component Value Date/Time   NA 141 05/09/2024 0737   NA 143 02/05/2021 0958   K 4.6 05/09/2024 0737   CL 106 05/09/2024 0737   CO2 31 05/09/2024 0737   BUN 26 (H) 05/09/2024 0737   BUN 15 02/05/2021 0958   CREATININE 0.83 05/09/2024 0737   GLU 78 08/03/2018 0834      Component Value Date/Time   CALCIUM 9.4 05/09/2024 0737   ALKPHOS 54  05/09/2024 0737   AST 17 05/09/2024 0737   ALT 14 05/09/2024 0737   BILITOT 0.5 05/09/2024 0737       RADIOGRAPHIC STUDIES: CT Chest W Contrast Result Date: 05/13/2024 CLINICAL DATA:  Non-small cell lung cancer restaging, status post right upper lobectomy * Tracking Code: BO * EXAM: CT CHEST WITH CONTRAST TECHNIQUE: Multidetector CT imaging of the chest was performed during intravenous contrast administration. RADIATION DOSE REDUCTION: This exam was performed according to the departmental dose-optimization program which includes automated exposure control, adjustment of the mA and/or kV according to patient size and/or use of iterative reconstruction technique. CONTRAST:  75mL OMNIPAQUE  IOHEXOL  300 MG/ML  SOLN COMPARISON:  11/10/2023 FINDINGS: Cardiovascular: Scattered aortic atherosclerosis. Normal heart size. No pericardial effusion. Mediastinum/Nodes: Unchanged nodule or lymph node in the anterior mediastinum measuring 1.2 x 0.8 cm (series 2, image 51). No other enlarged mediastinal, hilar, or axillary lymph nodes. Thyroid  gland, trachea, and esophagus demonstrate no significant findings. Lungs/Pleura: Status post right upper lobectomy. Slightly improved aeration right middle lobe situated in the apex of the right hemithorax. Left lung is normally aerated. No pleural effusion or pneumothorax. Upper Abdomen: No acute  abnormality. Musculoskeletal: No chest wall abnormality. No acute osseous findings. IMPRESSION: 1. Status post right upper lobectomy. 2. Slightly improved aeration of the right middle lobe situated in the apex of the right hemithorax. 3. Unchanged nodule or lymph node in the anterior mediastinum measuring 1.2 x 0.8 cm, not previously PET avid. 4. No evidence of recurrent or metastatic disease in the chest. Aortic Atherosclerosis (ICD10-I70.0). Electronically Signed   By: Marolyn JONETTA Jaksch M.D.   On: 05/13/2024 07:26    ASSESSMENT AND PLAN: This is a very pleasant 78 years old African-American  female diagnosed with stage Ia (T1b, N0, M0) non-small cell lung cancer, adenocarcinoma presented with right upper lobe lung nodule status post right upper lobectomy with lymph node sampling under the care of Dr. Shyrl with tumor size of 1.2 cm and no evidence for visceral pleural or lymphovascular invasion.  She is currently on observation and feeling fine. The patient had repeat CT scan of the chest performed recently.  I personally and independently reviewed the scan and discussed the result with the patient and her husband.  Her scan showed no concerning findings for disease progression and she continues to have manage indurated nodule/lymph node in the anterior mediastinum that was not previously PET avid. Assessment and Plan Assessment & Plan Stage IA non-small cell lung cancer, status post right upper lobectomy, under surveillance Stage IA non-small cell lung cancer, adenocarcinoma subtype, status post right upper lobectomy with lymph node sampling on April 08, 2022. Currently under surveillance with no evidence of recurrent or metastatic disease on recent CT scan. The mediastinal lymph node remains unchanged in size and was not previously PET avid, indicating no active disease. After two years of observation without concerning findings, surveillance frequency is reduced to annual CT scans. - Continue surveillance with annual CT scans. - Instruct to report any new symptoms or changes in health status immediately.  Stable mediastinal lymphadenopathy under surveillance Stable mediastinal lymphadenopathy noted on recent CT scan, measuring 1.2 x 0.8 cm, unchanged from previous imaging. The lymph node is located in the anterior mediastinum and has not shown any PET avidity, suggesting no active disease. - Continue surveillance with annual CT scans. - Instruct to report any new symptoms or changes in health status immediately. The patient voices understanding of current disease status and treatment  options and is in agreement with the current care plan.  All questions were answered. The patient knows to call the clinic with any problems, questions or concerns. We can certainly see the patient much sooner if necessary. The total time spent in the appointment was 30 minutes including review of chart and various tests results, discussions about plan of care and coordination of care plan .   Disclaimer: This note was dictated with voice recognition software. Similar sounding words can inadvertently be transcribed and may not be corrected upon review.

## 2024-08-01 ENCOUNTER — Ambulatory Visit (INDEPENDENT_AMBULATORY_CARE_PROVIDER_SITE_OTHER): Admitting: Family Medicine

## 2024-08-01 ENCOUNTER — Ambulatory Visit: Payer: Self-pay | Admitting: *Deleted

## 2024-08-01 ENCOUNTER — Encounter: Payer: Self-pay | Admitting: Family Medicine

## 2024-08-01 VITALS — BP 160/80 | HR 66 | Temp 97.7°F | Wt 134.4 lb

## 2024-08-01 DIAGNOSIS — I10 Essential (primary) hypertension: Secondary | ICD-10-CM | POA: Diagnosis not present

## 2024-08-01 MED ORDER — CHLORTHALIDONE 25 MG PO TABS: 25.0000 mg | ORAL_TABLET | Freq: Every day | ORAL | 3 refills | Status: DC

## 2024-08-01 NOTE — Patient Instructions (Signed)
 Beet root powder at least 3.5 grams daily to help lower blood pressure

## 2024-08-01 NOTE — Telephone Encounter (Signed)
 Copied from CRM [id number]. Topic: Clinical - Red Word Triage >> Aug 01, 2024  7:52 AM [name] wrote: Red Word that prompted transfer to Nurse Triage: checked BP today- 142/100- left side 168/79 left side. Reason for Disposition  Systolic BP >= 180 OR Diastolic >= 110  Answer Assessment - Initial Assessment Questions 1. BLOOD PRESSURE: What is your blood pressure? Did you take at least two measurements 5 minutes apart?     142/100-L, 198/79-R- 7:20 am, 197/91- 8:05 2. ONSET: When did you take your blood pressure?     Today first day of these type of reading 3. HOW: How did you take your blood pressure? (e.g., automatic home BP monitor, visiting nurse)     Automatic cuff- arm 4. HISTORY: Do you have a history of high blood pressure?     Yes- stopped taking at beginning of the year 5. MEDICINES: Are you taking any medicines for blood pressure? Have you missed any doses recently?     No medication presently 6. OTHER SYMPTOMS: Do you have any symptoms? (e.g., blurred vision, chest pain, difficulty breathing, headache, weakness)     headache  Protocols used: Blood Pressure - High-A-AH

## 2024-08-01 NOTE — Progress Notes (Signed)
 Acute Office Visit  Subjective:     Patient ID: Erica Greene, female    DOB: November 19, 1945, 78 y.o.   MRN: [id number]  Chief Complaint  Patient presents with   Hypertension    Elevated BP readings of 143/100 and 197/109 x1 day   Shortness of Breath    X2 days   Headache    X1 day, tried Tylenol  with no relief    HPI Patient is in today for  Discussed the use of AI scribe software for clinical note transcription with the patient, who gave verbal consent to proceed.  History of Present Illness   Erica Greene is a 78 year old female with hypertension who presents with elevated blood pressure readings.  She has elevated blood pressure readings at home and during recent medical visits, with a current office reading of 160/80 mmHg. She has been treated with lisinopril, amlodipine, but experienced allergic reactions and adverse effects such as swelling and indigestion. She is currently not taking any blood pressure medications.  She reports she was taking hydrochlorothiazide  also however she thought this medication was causing her to be unable to swallow food. States that she stopped this and also stopped her statin medication after having an endoscopy which was unremarkable. States that her symptoms resolved when she stopped these medications.   She experiences headaches, particularly yesterday, but has no blurry vision. She has sinus problems and difficulty breathing through her nose. Her breathing has changed since her right upper lobectomy and is described as 'heavy' at times. A recent CT scan in August 2025 showed no evidence of recurrent disease in the chest. No excessive coughing or mucus production.  She is not taking any medications, including antacids, and has not tried beetroot powder or other supplements for blood pressure management. She exercises daily but did not exercise today due to concerns about her blood pressure.      Review of Systems  All other systems reviewed and  are negative.       Objective:    BP (!) 160/80   Pulse 66   Temp 97.7 F (36.5 C) (Oral)   Wt 134 lb 6.4 oz (61 kg)   SpO2 98%   BMI 29.08 kg/m    Physical Exam Vitals reviewed.  Constitutional:      Appearance: She is well-developed and normal weight.  HENT:     Mouth/Throat:     Mouth: Mucous membranes are moist.     Pharynx: No oropharyngeal exudate.  Cardiovascular:     Rate and Rhythm: Normal rate and regular rhythm.     Heart sounds: Normal heart sounds. No murmur heard. Pulmonary:     Effort: Pulmonary effort is normal.     Breath sounds: Normal breath sounds. No wheezing, rhonchi or rales.  Musculoskeletal:     Cervical back: Normal range of motion.  Lymphadenopathy:     Cervical: No cervical adenopathy.  Neurological:     Mental Status: She is alert and oriented to person, place, and time.  Psychiatric:        Mood and Affect: Mood normal.        Behavior: Behavior normal.     No results found for any visits on 08/01/24.      Assessment & Plan:   Problem List Items Addressed This Visit       Unprioritized   Essential hypertension - Primary   Relevant Medications   chlorthalidone (HYGROTON) 25 MG tablet   Assessment and Plan  Hypertension with adverse reactions to antihypertensive medications Hypertension with recent increase in blood pressure readings, including 160/80 mmHg in the office. Previous adverse reactions to lisinopril, amlodipine, and hydrochlorothiazide . Limited options due to allergies. Consideration of chlorthalidone as an alternative. Discussed potential side effects and the need for hydration and food intake to mitigate gastrointestinal symptoms. Emphasized the importance of controlling blood pressure to prevent organ damage. Discussed the use of beetroot powder as a supplementary measure to lower blood pressure. - Prescribed chlorthalidone with a 30-day supply and refills. - Advised taking chlorthalidone with food to reduce  gastrointestinal side effects. - Encouraged increased water intake. - Recommended beetroot powder as a supplementary measure to lower blood pressure. - Instructed to follow up with Doctor Jordan for blood pressure check and kidney function assessment.  Status post right upper lobectomy for lung cancer Status post right upper lobectomy with recent CT scan in August showing no evidence of recurrent disease. Breathing changes noted post-surgery, but no current signs of infection or recurrence. Oxygen levels are normal. Discussed potential causes of breathing changes, including anxiety and environmental factors. - Continue routine follow-up with oncologist every six months. - Will consider chest film if breathing symptoms persist or worsen.  Chronic nasal congestion and sinus symptoms Chronic nasal congestion and sinus symptoms, possibly exacerbated by seasonal changes and dry air. No signs of infection or significant respiratory distress. Discussed potential use of antihistamines for symptom relief. - Consider antihistamines for symptom relief.       Meds ordered this encounter  Medications   chlorthalidone (HYGROTON) 25 MG tablet    Sig: Take 1 tablet (25 mg total) by mouth daily.    Dispense:  30 tablet    Refill:  3    Return in about 1 month (around 08/31/2024) for HTN -- blood pressure check, BMP check with Dr. Jordan.  Heron CHRISTELLA Sharper, MD

## 2024-08-01 NOTE — Telephone Encounter (Signed)
 FYI Only or Action Required?: FYI only for provider: appointment scheduled on 11/3.  Patient was last seen in primary care on 04/12/2024 by Luke Chiquita SAUNDERS, DO.  Called Nurse Triage reporting Hypertension.  Symptoms began today.  Interventions attempted: Nothing.  Symptoms are: stable.  Triage Disposition: See Physician Within 24 Hours  Patient/caregiver understands and will follow disposition?: Yes

## 2024-08-24 ENCOUNTER — Ambulatory Visit (INDEPENDENT_AMBULATORY_CARE_PROVIDER_SITE_OTHER)

## 2024-08-24 VITALS — BP 148/80 | HR 78 | Temp 98.0°F | Ht <= 58 in | Wt 133.0 lb

## 2024-08-24 DIAGNOSIS — R062 Wheezing: Secondary | ICD-10-CM

## 2024-08-24 DIAGNOSIS — Z85118 Personal history of other malignant neoplasm of bronchus and lung: Secondary | ICD-10-CM

## 2024-08-24 DIAGNOSIS — R0602 Shortness of breath: Secondary | ICD-10-CM

## 2024-08-24 DIAGNOSIS — Z902 Acquired absence of lung [part of]: Secondary | ICD-10-CM

## 2024-08-24 DIAGNOSIS — J309 Allergic rhinitis, unspecified: Secondary | ICD-10-CM

## 2024-08-24 NOTE — Progress Notes (Signed)
 New Patient Pulmonology Office Visit   Subjective:  Patient ID: Charlann Wayne, female    DOB: 07-Feb-1946  MRN: [id number]  Referred by: Jordan, [name], MD  CC:  Chief Complaint  Patient presents with   Consult   Shortness of Breath    Patient states her breathing has improved a little, but she still gets out breath when rushing.    HPI Angell Pincock is a 78 y.o. female who is here to establish care with me and regular follow up of possible asthma  Discussed the use of AI scribe software for clinical note transcription with the patient, who gave verbal consent to proceed.  History of Present Illness Yitzel Shasteen is a 77 year old female with lung cancer who presents with shortness of breath and wheezing.  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 A mediastinal nodule remains and is being monitored with scans every 6-12 months by oncology clinic  She experiences shortness of breath primarily during exertion, such as aerobic exercises, which she performs six days a week. She does not have limitations during exercise aside from occasional shortness of breath with exertion. No significant breathing problems were noted before her surgery, except for a cough that was present even before her cancer diagnosis. Currently, she only coughs when in pain or rushing.  She has not been diagnosed with asthma, but there is a mention of asthma in her medical notes and empiric ICS/LABA has been tried. She denies having asthma as a child and reports no significant environmental allergies, although she suspects she might be allergic to pollen, hay, dust mites, and cockroaches. She has a history of allergies to medications and latex.  She was prescribed Symbicort  for her symptoms but stopped using it due to side effects, including insomnia. Attempts to use it in the morning still resulted in sleeplessness. She has not used any other inhalers since then.  She  denies any history of smoking and reports no significant heart problems, although she had a heart murmur in the past which required antibiotics before dental procedures. She is retired from a career in arts administrator and had some environmental exposure during her work.       ROS Review of symptoms negative except mentioned above   Allergies: Penicillins, Amlodipine, Lisinopril, Sulfa antibiotics, and Latex  Current Outpatient Medications:    acetaminophen  (TYLENOL ) 325 MG tablet, Take 2 tablets (650 mg total) by mouth every 4 (four) hours as needed for mild pain or moderate pain., Disp: , Rfl:    chlorthalidone  (HYGROTON ) 25 MG tablet, Take 1 tablet (25 mg total) by mouth daily., Disp: 30 tablet, Rfl: 3   Cholecalciferol ([employer] VIT D 5000 HIGH-POTENCY PO), Take 5,000 Units by mouth daily., Disp: , Rfl:    Coenzyme Q10 (COQ10 PO), Take 1 tablet by mouth at bedtime., Disp: , Rfl:    MAGNESIUM PO, Take 200 mg by mouth daily., Disp: , Rfl:    Multiple Vitamin (MULTIVITAMIN ADULT PO), Take by mouth daily., Disp: , Rfl:    Probiotic Product (PROBIOTIC DAILY PO), Take 1 Capful by mouth daily., Disp: , Rfl:    Turmeric 500 MG CAPS, Take 500 mg by mouth daily., Disp: , Rfl:    fluticasone  (FLONASE ) 50 MCG/ACT nasal spray, Place 1 spray into both nostrils daily. (Patient not taking: Reported on 08/24/2024), Disp: 16 g, Rfl: 5 Past Medical History:  Diagnosis Date   10 year risk of MI or stroke 7.5%  or greater 08/17/2020   16.2%   Anemia    during pregnancies   Cancer (HCC)    pre-cancerous skin lesions on face. surgically removed.   Family history of breast cancer    Family history of uterine cancer    Hypertension    Lung cancer (HCC) 03/2022   Pneumonia    as a child   Past Surgical History:  Procedure Laterality Date   ABDOMINAL HYSTERECTOMY  1997   TOTAL ABDOMINAL HYSTERECTOMY   BREAST SURGERY     BREAST REDUCTION   COLONOSCOPY     11 yrs ago in [place] - was Normal per pt.   cyst  removal foot     CYST REMOVAL HAND     INTERCOSTAL NERVE BLOCK Right 04/08/2022   Procedure: INTERCOSTAL NERVE BLOCK;  Surgeon: Shyrl Linnie KIDD, MD;  Location: [facility];  Service: Thoracic;  Laterality: Right;   NODE DISSECTION Right 04/08/2022   Procedure: NODE DISSECTION;  Surgeon: Shyrl Linnie KIDD, MD;  Location: [facility] OR;  Service: Thoracic;  Laterality: Right;   WISDOM TOOTH EXTRACTION     Family History  Problem Relation Age of Onset   Breast cancer Mother 60   Uterine cancer Mother 56   Cancer Father        LUNG AND THROAT - SMOKER   Breast cancer Daughter 64   Cervical cancer Maternal Aunt        dx in her 6s   Congestive Heart Failure Maternal Aunt 80   Uterine cancer Paternal Aunt        dx >50   Cancer Paternal Uncle        LUNG    Cancer Paternal Uncle        LUNG   Cancer Paternal Uncle        LUNG   Cancer Paternal Uncle        LUNG   Kidney failure Maternal Grandfather    Cancer Paternal Grandmother        NOS   Cancer Paternal Grandfather        lung and throat - smoker   Kidney failure Brother    Colon cancer Neg Hx    Colon polyps Neg Hx    Social History   Socioeconomic History   Marital status: Married    Spouse name: Augusta   Number of children: 2   Years of education: Not on file   Highest education level: Not on file  Occupational History   Not on file  Tobacco Use   Smoking status: Never   Smokeless tobacco: Never  Vaping Use   Vaping status: Never Used  Substance and Sexual Activity   Alcohol use: Yes    Alcohol/week: 0.0 standard drinks of alcohol    Comment: OCC   Drug use: No   Sexual activity: Yes  Other Topics Concern   Not on file  Social History Narrative   Not on file   Social Drivers of Health   Financial Resource Strain: Not on file  Food Insecurity: Not on file  Transportation Needs: Not on file  Physical Activity: Not on file  Stress: Not on file  Social Connections: Not on file  Intimate Partner Violence:  Not on file         Objective:  BP (!) 148/80   Pulse 78   Temp 98 F (36.7 C) (Oral)   Ht 4' 9 (1.448 m)   Wt 133 lb (60.3 kg)   SpO2 98%  BMI 28.78 kg/m    Physical Exam Constitutional:      General: She is not in acute distress.    Appearance: Normal appearance.  HENT:     Mouth/Throat:     Mouth: Mucous membranes are moist.  Cardiovascular:     Rate and Rhythm: Normal rate.  Pulmonary:     Effort: No respiratory distress.     Breath sounds: No wheezing or rales.  Musculoskeletal:     Right lower leg: No edema.     Left lower leg: No edema.  Skin:    General: Skin is warm.  Neurological:     Mental Status: She is alert and oriented to person, place, and time.  Psychiatric:        Mood and Affect: Mood normal.     Diagnostic Review:    Pft    Latest Ref Rng & Units 03/07/2022   12:29 PM  PFT Results  FVC-Pre L 1.50   FVC-Predicted Pre % 72   FVC-Post L 1.55   FVC-Predicted Post % 74   Pre FEV1/FVC % % 90   Post FEV1/FCV % % 92   FEV1-Pre L 1.34   FEV1-Predicted Pre % 87   FEV1-Post L 1.43   DLCO uncorrected ml/min/mmHg 18.45   DLCO UNC% % 119   DLVA Predicted % 147   TLC L 3.53   TLC % Predicted % 85   RV % Predicted % 82   6 % change post BD       Results LABS Eosinophil count: 2% , absolute count 100 (04/2024)  CT chest reports 04/2024 and 10/2023 and oncology notes reviewed      Assessment & Plan:   Assessment & Plan SOB (shortness of breath) Likely multifactorial Will r/o restriction Will r/o asthma Orders:   CBC with Differential/Platelet; Future   IgE; Future   RESPIRATORY ALLERGY  PANEL REGION II W/ RFLX: [place]  Wheezing Could be related to asthma But has significant A/E to inhalers Will consider ICS only inhaler if PFT/labs suggest asthma Orders:   CBC with Differential/Platelet; Future   IgE; Future   RESPIRATORY ALLERGY  PANEL REGION II W/ RFLX: [place]  Allergic rhinitis, unspecified  seasonality, unspecified trigger Advised pt to use flonase  or saline spray daily Orders:   CBC with Differential/Platelet; Future   IgE; Future   RESPIRATORY ALLERGY  PANEL REGION II W/ RFLX: [place]  History of lobectomy of lung Stable  Update PFT    History of adenocarcinoma of lung Following with oncology Surveillance imaging as per onc      Thank you for the opportunity to take part in the care of Laniya Friedl   Return in about 2 months (around 10/24/2024).   Amerie Beaumont Pleas, MD [facility] Office: [phone]

## 2024-08-24 NOTE — Progress Notes (Deleted)
 New Patient Pulmonology Office Visit   Subjective:  Patient ID: Erica Greene, female    DOB: 1946/02/13  MRN: [id number]  Referred by: Jordan, [name], MD  CC:  Chief Complaint  Patient presents with   Consult   Shortness of Breath    Patient states her breathing has improved a little, but she still gets out breath when rushing.    HPI Erica Greene is a 78 y.o. female with ***  Discussed the use of AI scribe software for clinical note transcription with the patient, who gave verbal consent to proceed.  History of Present Illness      {PULM QUESTIONNAIRES (Optional):33196}  ROS  Allergies: Penicillins, Amlodipine, Lisinopril, Sulfa antibiotics, and Latex  Current Outpatient Medications:    acetaminophen  (TYLENOL ) 325 MG tablet, Take 2 tablets (650 mg total) by mouth every 4 (four) hours as needed for mild pain or moderate pain., Disp: , Rfl:    chlorthalidone  (HYGROTON ) 25 MG tablet, Take 1 tablet (25 mg total) by mouth daily., Disp: 30 tablet, Rfl: 3   Cholecalciferol ([employer]S VIT D 5000 HIGH-POTENCY PO), Take 5,000 Units by mouth daily., Disp: , Rfl:    Coenzyme Q10 (COQ10 PO), Take 1 tablet by mouth at bedtime., Disp: , Rfl:    MAGNESIUM PO, Take 200 mg by mouth daily., Disp: , Rfl:    Multiple Vitamin (MULTIVITAMIN ADULT PO), Take by mouth daily., Disp: , Rfl:    Probiotic Product (PROBIOTIC DAILY PO), Take 1 Capful by mouth daily., Disp: , Rfl:    Turmeric 500 MG CAPS, Take 500 mg by mouth daily., Disp: , Rfl:    fluticasone  (FLONASE ) 50 MCG/ACT nasal spray, Place 1 spray into both nostrils daily. (Patient not taking: Reported on 08/24/2024), Disp: 16 g, Rfl: 5 Past Medical History:  Diagnosis Date   10 year risk of MI or stroke 7.5% or greater 08/17/2020   16.2%   Anemia    during pregnancies   Cancer (HCC)    pre-cancerous skin lesions on face. surgically removed.   Family history of breast cancer    Family history of uterine cancer    Hypertension    Lung  cancer (HCC) 03/2022   Pneumonia    as a child   Past Surgical History:  Procedure Laterality Date   ABDOMINAL HYSTERECTOMY  1997   TOTAL ABDOMINAL HYSTERECTOMY   BREAST SURGERY     BREAST REDUCTION   COLONOSCOPY     11 yrs ago in [place] - was Normal per pt.   cyst removal foot     CYST REMOVAL HAND     INTERCOSTAL NERVE BLOCK Right 04/08/2022   Procedure: INTERCOSTAL NERVE BLOCK;  Surgeon: Shyrl Linnie KIDD, MD;  Location: [facility];  Service: Thoracic;  Laterality: Right;   NODE DISSECTION Right 04/08/2022   Procedure: NODE DISSECTION;  Surgeon: Shyrl Linnie KIDD, MD;  Location: [facility];  Service: Thoracic;  Laterality: Right;   WISDOM TOOTH EXTRACTION     Family History  Problem Relation Age of Onset   Breast cancer Mother 51   Uterine cancer Mother 32   Cancer Father        LUNG AND THROAT - SMOKER   Breast cancer Daughter 79   Cervical cancer Maternal Aunt        dx in her 33s   Congestive Heart Failure Maternal Aunt 80   Uterine cancer Paternal Aunt        dx >50   Cancer Paternal Uncle  LUNG    Cancer Paternal Uncle        LUNG   Cancer Paternal Uncle        LUNG   Cancer Paternal Uncle        LUNG   Kidney failure Maternal Grandfather    Cancer Paternal Grandmother        NOS   Cancer Paternal Grandfather        lung and throat - smoker   Kidney failure Brother    Colon cancer Neg Hx    Colon polyps Neg Hx    Social History   Socioeconomic History   Marital status: Married    Spouse name: Augusta   Number of children: 2   Years of education: Not on file   Highest education level: Not on file  Occupational History   Not on file  Tobacco Use   Smoking status: Never   Smokeless tobacco: Never  Vaping Use   Vaping status: Never Used  Substance and Sexual Activity   Alcohol use: Yes    Alcohol/week: 0.0 standard drinks of alcohol    Comment: OCC   Drug use: No   Sexual activity: Yes  Other Topics Concern   Not on file  Social History  Narrative   Not on file   Social Drivers of Health   Financial Resource Strain: Not on file  Food Insecurity: Not on file  Transportation Needs: Not on file  Physical Activity: Not on file  Stress: Not on file  Social Connections: Not on file  Intimate Partner Violence: Not on file         Objective:  BP (!) 148/80   Pulse 78   Temp 98 F (36.7 C) (Oral)   Ht 4' 9 (1.448 m)   Wt 133 lb (60.3 kg)   SpO2 98%   BMI 28.78 kg/m  {Pulm Vitals (Optional):32837}  Physical Exam  Diagnostic Review:  {Labs (Optional):32838}  Pft    Latest Ref Rng & Units 03/07/2022   12:29 PM  PFT Results  FVC-Pre L 1.50   FVC-Predicted Pre % 72   FVC-Post L 1.55   FVC-Predicted Post % 74   Pre FEV1/FVC % % 90   Post FEV1/FCV % % 92   FEV1-Pre L 1.34   FEV1-Predicted Pre % 87   FEV1-Post L 1.43   DLCO uncorrected ml/min/mmHg 18.45   DLCO UNC% % 119   DLVA Predicted % 147   TLC L 3.53   TLC % Predicted % 85   RV % Predicted % 82          Results       Assessment & Plan:   Assessment & Plan    Thank you for the opportunity to take part in the care of Erica Greene   No follow-ups on file.   Goerge Mohr Pleas, MD [facility] Office: [phone]

## 2024-08-24 NOTE — Patient Instructions (Signed)
 It was a pleasure to see you today.   VISIT SUMMARY: Today, we discussed your ongoing symptoms of shortness of breath and wheezing following your lung cancer surgery. We also addressed your seasonal allergies and potential asthma. We have planned further tests to better understand your symptoms and adjust your treatment accordingly.  YOUR PLAN: LUNG CANCER STATUS POST LOBECTOMY UNDER ACTIVE SURVEILLANCE: You are currently under active surveillance for lung cancer following your lobectomy surgery. There is no current evidence of disease recurrence as per last scans -Continue with scheduled scans as per oncology recommendations  POST-LOBECTOMY SHORTNESS OF BREATH AND INTERMITTENT WHEEZING: You experience shortness of breath and wheezing primarily during exertion. This may be due to reduced lung capacity from your surgery or potential asthma. -We have ordered a pulmonary function test to assess your lung capacity and evaluate for asthma. -We have also ordered an allergy  blood test to check for elevated allergic markers. -If asthma is confirmed, we will consider a trial of a steroid-based inhaler, avoiding LABA bronchodilators due to your sensitivity. -We will review the results of your tests in a follow-up appointment and adjust your treatment plan as needed.  SEASONAL ALLERGIC RHINITIS: You have symptoms of nasal congestion and sinus drainage, particularly at night, which may be due to seasonal allergies. -Try using Flonase  or a saline nasal spray for relief from nasal congestion. -We have ordered an allergy  blood test to check for elevated allergic markers. -If your allergic markers are elevated, we will consider a daily allergy  medication.

## 2024-08-29 ENCOUNTER — Other Ambulatory Visit

## 2024-08-29 DIAGNOSIS — R0602 Shortness of breath: Secondary | ICD-10-CM | POA: Diagnosis not present

## 2024-08-29 DIAGNOSIS — J309 Allergic rhinitis, unspecified: Secondary | ICD-10-CM

## 2024-08-29 DIAGNOSIS — R062 Wheezing: Secondary | ICD-10-CM | POA: Diagnosis not present

## 2024-08-29 LAB — CBC WITH DIFFERENTIAL/PLATELET
Basophils Absolute: 0 K/uL (ref 0.0–0.1)
Basophils Relative: 0.3 % (ref 0.0–3.0)
Eosinophils Absolute: 0.1 K/uL (ref 0.0–0.7)
Eosinophils Relative: 1.1 % (ref 0.0–5.0)
HCT: 42.5 % (ref 36.0–46.0)
Hemoglobin: 14.1 g/dL (ref 12.0–15.0)
Lymphocytes Relative: 33.6 % (ref 12.0–46.0)
Lymphs Abs: 2.7 K/uL (ref 0.7–4.0)
MCHC: 33.2 g/dL (ref 30.0–36.0)
MCV: 86.9 fl (ref 78.0–100.0)
Monocytes Absolute: 0.5 K/uL (ref 0.1–1.0)
Monocytes Relative: 6.2 % (ref 3.0–12.0)
Neutro Abs: 4.7 K/uL (ref 1.4–7.7)
Neutrophils Relative %: 58.8 % (ref 43.0–77.0)
Platelets: 402 K/uL — ABNORMAL HIGH (ref 150.0–400.0)
RBC: 4.9 Mil/uL (ref 3.87–5.11)
RDW: 13.8 % (ref 11.5–15.5)
WBC: 8 K/uL (ref 4.0–10.5)

## 2024-08-30 LAB — IGE: IgE (Immunoglobulin E), Serum: 15 kU/L (ref <=114)

## 2024-08-31 ENCOUNTER — Ambulatory Visit: Payer: Self-pay | Admitting: Family Medicine

## 2024-08-31 ENCOUNTER — Ambulatory Visit (INDEPENDENT_AMBULATORY_CARE_PROVIDER_SITE_OTHER): Admitting: Family Medicine

## 2024-08-31 ENCOUNTER — Encounter: Payer: Self-pay | Admitting: Family Medicine

## 2024-08-31 VITALS — BP 140/75 | Temp 98.3°F | Resp 16 | Ht <= 58 in | Wt 133.6 lb

## 2024-08-31 DIAGNOSIS — I10 Essential (primary) hypertension: Secondary | ICD-10-CM

## 2024-08-31 LAB — RESPIRATORY ALLERGY PANEL REGION II W/ RFLX: ~~LOC~~
Allergen, A. alternata, m6: <0.1 kU/L
Allergen, Cedar tree, t12: <0.1 kU/L
Allergen, Comm Silver Birch, t9: <0.1 kU/L
Allergen, Cottonwood, t14: <0.1 kU/L
Allergen, Mouse Urine Protein, e78: <0.1 kU/L
Allergen, Mulberry, t76: <0.1 kU/L
Allergen, Oak,t7: <0.1 kU/L
Allergen, P. notatum, m1: <0.1 kU/L
Aspergillus fumigatus, m3: <0.1 kU/L
Bermuda Grass: <0.1 kU/L
Box Elder IgE: <0.1 kU/L
CLADOSPORIUM HERBARUM (M2) IGE: <0.1 kU/L
COMMON RAGWEED (SHORT) (W1) IGE: <0.1 kU/L
Cat Dander: <0.1 kU/L
Class: 0
Class: 0
Class: 0
Class: 0
Class: 0
Class: 0
Class: 0
Class: 0
Class: 0
Class: 0
Class: 0
Class: 0
Class: 0
Class: 0
Class: 0
Class: 0
Class: 0
Class: 0
Class: 0
Class: 0
Class: 0
Class: 0
Class: 0
Class: 0
Cockroach: <0.1 kU/L
D. farinae: <0.1 kU/L
Dog Dander: <0.1 kU/L
Elm IgE: <0.1 kU/L
IgE (Immunoglobulin E), Serum: 16 kU/L (ref <=114)
IgE (Immunoglobulin E), Serum: <16 kU/L (ref <=114)
Johnson Grass: <0.1 kU/L
Pecan/Hickory Tree IgE: <0.1 kU/L
Rough Pigweed  IgE: <0.1 kU/L
Sheep Sorrel IgE: <0.1 kU/L
Timothy Grass: <0.1 kU/L

## 2024-08-31 LAB — BASIC METABOLIC PANEL WITH GFR
BUN: 19 mg/dL (ref 6–23)
CO2: 33 meq/L — ABNORMAL HIGH (ref 19–32)
Calcium: 10.1 mg/dL (ref 8.4–10.5)
Chloride: 99 meq/L (ref 96–112)
Creatinine, Ser: 0.83 mg/dL (ref 0.40–1.20)
GFR: 67.42 mL/min (ref >60.00)
Glucose, Bld: 107 mg/dL — ABNORMAL HIGH (ref 70–99)
Potassium: 4 meq/L (ref 3.5–5.1)
Sodium: 140 meq/L (ref 135–145)

## 2024-08-31 LAB — INTERPRETATION:

## 2024-08-31 MED ORDER — CHLORTHALIDONE 25 MG PO TABS: 25.0000 mg | ORAL_TABLET | Freq: Every day | ORAL | 2 refills | Status: AC

## 2024-08-31 NOTE — Patient Instructions (Addendum)
 A few things to remember from today's visit:  Essential hypertension - Plan: Basic metabolic panel with GFR Continue Chlorthalidone  same dose. Monitor blood pressure regularly. I will see you next 02/2025, before if needed.  If you need refills for medications you take chronically, please call your pharmacy. Do not use My Chart to request refills or for acute issues that need immediate attention. If you send a my chart message, it may take a few days to be addressed, specially if I am not in the office.  Please be sure medication list is accurate. If a new problem present, please set up appointment sooner than planned today.

## 2024-08-31 NOTE — Progress Notes (Signed)
 Chief Complaint  Patient presents with   Medical Management of Chronic Issues    One month follow-up    Discussed the use of AI scribe software for clinical note transcription with the patient, who gave verbal consent to proceed.  History of Present Illness Erica Greene is a 78 year old female with PMHx significant for hypertension, hyperlipidemia, GERD, and non-small cell lung cancer status post right upper lobectomy in 03/2022. Last seen on 03/07/2024. Since her last visit she has seen her pulmonologist, Dr. Pleas, for dyspnea.  She also saw her oncologist on 05/16/2024. He evaluated here in the office by Dr. Ozell because of elevated BP, 08/01/24.She experienced a headache lasting from Saturday to Sunday, which was unusual for her. This prompted her to check her blood pressure, which she found to be elevated.   She was started on chlorthalidone  25 mg daily, which she has tolerated well. She has been monitoring her blood pressure since then, noting that it is usually around 130/72 mmHg, sometimes less, but never as high as 140 mmHg.  No chest pain, worsening dyspnea,edema, or palpitations.  She maintains a low-salt diet and exercises aerobically six days a week, including pool exercises on Saturdays.   Lab Results  Component Value Date   NA 141 05/09/2024   CL 106 05/09/2024   K 4.6 05/09/2024   CO2 31 05/09/2024   BUN 26 (H) 05/09/2024   CREATININE 0.83 05/09/2024   GFRNONAA >60 05/09/2024   CALCIUM 9.4 05/09/2024   ALBUMIN 4.2 05/09/2024   GLUCOSE 87 05/09/2024   Review of Systems  Constitutional:  Negative for activity change, appetite change, chills and fever.  Eyes:  Negative for redness and visual disturbance.  Respiratory:  Negative for cough and wheezing.   Gastrointestinal:  Negative for abdominal pain, nausea and vomiting.  Genitourinary:  Negative for decreased urine volume, dysuria and hematuria.  Skin:  Negative for rash.  Neurological:  Negative for  syncope, facial asymmetry and weakness.  See other pertinent positives and negatives in HPI.  Current Outpatient Medications on File Prior to Visit  Medication Sig Dispense Refill   acetaminophen  (TYLENOL ) 325 MG tablet Take 2 tablets (650 mg total) by mouth every 4 (four) hours as needed for mild pain or moderate pain.     Cholecalciferol ([employer]S VIT D 5000 HIGH-POTENCY PO) Take 5,000 Units by mouth daily.     Coenzyme Q10 (COQ10 PO) Take 1 tablet by mouth at bedtime.     MAGNESIUM PO Take 200 mg by mouth daily.     Multiple Vitamin (MULTIVITAMIN ADULT PO) Take by mouth daily.     Probiotic Product (PROBIOTIC DAILY PO) Take 1 Capful by mouth daily.     Turmeric 500 MG CAPS Take 500 mg by mouth daily.     fluticasone  (FLONASE ) 50 MCG/ACT nasal spray Place 1 spray into both nostrils daily. (Patient not taking: Reported on 08/31/2024) 16 g 5   No current facility-administered medications on file prior to visit.   Past Medical History:  Diagnosis Date   10 year risk of MI or stroke 7.5% or greater 08/17/2020   16.2%   Anemia    during pregnancies   Cancer (HCC)    pre-cancerous skin lesions on face. surgically removed.   Family history of breast cancer    Family history of uterine cancer    Hypertension    Lung cancer (HCC) 03/2022   Pneumonia    as a child   Allergies  Allergen Reactions  Penicillins Anaphylaxis   Amlodipine Swelling    Gums swell   Lisinopril Swelling    Facial swelling   Sulfa Antibiotics Itching   Latex Rash    Social History   Socioeconomic History   Marital status: Married    Spouse name: Erica Greene   Number of children: 2   Years of education: Not on file   Highest education level: Not on file  Occupational History   Not on file  Tobacco Use   Smoking status: Never   Smokeless tobacco: Never  Vaping Use   Vaping status: Never Used  Substance and Sexual Activity   Alcohol use: Yes    Alcohol/week: 0.0 standard drinks of alcohol    Comment: OCC    Drug use: No   Sexual activity: Yes  Other Topics Concern   Not on file  Social History Narrative   Not on file   Social Drivers of Health   Financial Resource Strain: Not on file  Food Insecurity: Not on file  Transportation Needs: Not on file  Physical Activity: Not on file  Stress: Not on file  Social Connections: Not on file    Vitals:   08/31/24 0724 08/31/24 0756  BP: (!) 144/80 (!) 140/75  Resp: 16   Temp: 98.3 F (36.8 C)   SpO2: 98%    Body mass index is 28.91 kg/m.  Physical Exam Vitals and nursing note reviewed.  Constitutional:      General: She is not in acute distress.    Appearance: She is well-developed.  HENT:     Head: Normocephalic and atraumatic.     Mouth/Throat:     Pharynx: Uvula midline.  Eyes:     Conjunctiva/sclera: Conjunctivae normal.  Cardiovascular:     Rate and Rhythm: Normal rate and regular rhythm.     Pulses:          Dorsalis pedis pulses are 2+ on the right side and 2+ on the left side.     Heart sounds: Murmur (Soft SEM LUSB) heard.  Pulmonary:     Effort: Pulmonary effort is normal. No respiratory distress.     Breath sounds: Normal breath sounds.  Abdominal:     Palpations: Abdomen is soft. There is no mass.     Tenderness: There is no abdominal tenderness.  Musculoskeletal:     Right lower leg: No edema.     Left lower leg: No edema.  Skin:    General: Skin is warm.     Findings: No erythema or rash.  Neurological:     General: No focal deficit present.     Mental Status: She is alert and oriented to person, place, and time.     Gait: Gait normal.  Psychiatric:        Mood and Affect: Mood and affect normal.   ASSESSMENT AND PLAN:  [name] was seen today for medical management of chronic issues.  Diagnoses and all orders for this visit: Orders Placed This Encounter  Procedures   Basic metabolic panel with GFR   Lab Results  Component Value Date   NA 140 08/31/2024   CL 99 08/31/2024   K  4.0 08/31/2024   CO2 33 (H) 08/31/2024   BUN 19 08/31/2024   CREATININE 0.83 08/31/2024   GFR 67.42 08/31/2024   CALCIUM 10.1 08/31/2024   ALBUMIN 4.2 05/09/2024   GLUCOSE 107 (H) 08/31/2024   Essential hypertension Assessment & Plan: BP today mildly elevated, improved after a few  minutes.  She reported lower BPs at home, so for now continue chlorthalidone  25 mg daily and low-salt diet. Continue monitoring BP regularly. We discussed some side effects of medication. BMP ordered today. I will see her back in 02/2025, before if needed.  Orders: -     Basic metabolic panel with GFR; Future -     Chlorthalidone ; Take 1 tablet (25 mg total) by mouth daily.  Dispense: 90 tablet; Refill: 2  I personally spent a total of 32 minutes in the care of the patient today including preparing to see the patient, getting/reviewing separately obtained history, performing a medically appropriate exam/evaluation, counseling and educating, placing orders, documenting clinical information in the EHR, and communicating results.  Return if symptoms worsen or fail to improve, for keep next appointment.  Antwain Caliendo G. Jailin Moomaw, MD  [facility]. [place] office.

## 2024-08-31 NOTE — Assessment & Plan Note (Signed)
 BP today mildly elevated, improved after a few minutes.  She reported lower BPs at home, so for now continue chlorthalidone  25 mg daily and low-salt diet. Continue monitoring BP regularly. We discussed some side effects of medication. BMP ordered today. I will see her back in 02/2025, before if needed.

## 2024-10-21 ENCOUNTER — Other Ambulatory Visit: Payer: Self-pay

## 2024-10-21 DIAGNOSIS — R0602 Shortness of breath: Secondary | ICD-10-CM

## 2024-10-26 ENCOUNTER — Encounter

## 2024-10-26 ENCOUNTER — Ambulatory Visit

## 2024-10-26 ENCOUNTER — Encounter: Payer: Self-pay | Admitting: Primary Care

## 2024-10-26 ENCOUNTER — Ambulatory Visit (INDEPENDENT_AMBULATORY_CARE_PROVIDER_SITE_OTHER): Admitting: Primary Care

## 2024-10-26 VITALS — BP 118/68 | HR 67 | Temp 97.8°F | Ht <= 58 in | Wt 130.8 lb

## 2024-10-26 DIAGNOSIS — Z902 Acquired absence of lung [part of]: Secondary | ICD-10-CM | POA: Diagnosis not present

## 2024-10-26 DIAGNOSIS — C3411 Malignant neoplasm of upper lobe, right bronchus or lung: Secondary | ICD-10-CM

## 2024-10-26 DIAGNOSIS — J984 Other disorders of lung: Secondary | ICD-10-CM | POA: Diagnosis not present

## 2024-10-26 DIAGNOSIS — R053 Chronic cough: Secondary | ICD-10-CM | POA: Diagnosis not present

## 2024-10-26 DIAGNOSIS — R0602 Shortness of breath: Secondary | ICD-10-CM

## 2024-10-26 LAB — PULMONARY FUNCTION TEST
DL/VA % pred: 113 %
DL/VA: 4.86 ml/min/mmHg/L
DLCO cor % pred: 75 %
DLCO cor: 11.54 ml/min/mmHg
DLCO unc % pred: 77 %
DLCO unc: 11.78 ml/min/mmHg
FEF 25-75 Post: 1.33 L/s
FEF 25-75 Pre: 1.13 L/s
FEF2575-%Change-Post: 17 %
FEF2575-%Pred-Post: 112 %
FEF2575-%Pred-Pre: 95 %
FEV1-%Change-Post: 3 %
FEV1-%Pred-Post: 92 %
FEV1-%Pred-Pre: 89 %
FEV1-Post: 1.35 L
FEV1-Pre: 1.31 L
FEV1FVC-%Change-Post: 8 %
FEV1FVC-%Pred-Pre: 104 %
FEV6-%Change-Post: -4 %
FEV6-%Pred-Post: 85 %
FEV6-%Pred-Pre: 90 %
FEV6-Post: 1.61 L
FEV6-Pre: 1.69 L
FEV6FVC-%Pred-Post: 106 %
FEV6FVC-%Pred-Pre: 106 %
FVC-%Change-Post: -4 %
FVC-%Pred-Post: 80 %
FVC-%Pred-Pre: 84 %
FVC-Post: 1.61 L
FVC-Pre: 1.69 L
Post FEV1/FVC ratio: 84 %
Post FEV6/FVC ratio: 100 %
Pre FEV1/FVC ratio: 77 %
Pre FEV6/FVC Ratio: 100 %
RV % pred: 65 %
RV: 1.34 L
TLC % pred: 72 %
TLC: 3.03 L

## 2024-10-26 NOTE — Patient Instructions (Addendum)
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 We reviewed your spirometry results, which showed mild restriction in lung volumes and mildly decreased diffusion capacity, and discussed your chronic cough likely related to sinus issues.  YOUR PLAN: -RESTRICTIVE LUNG DISEASE STATUS POST RIGHT UPPER LOBECTOMY FOR ADENOCARCINOMA: You have mild restrictive lung disease, which means your lung capacity is slightly reduced, likely due to your previous surgery. Your breathing test results are consistent with expected changes after surgery and show no signs of obstructive lung disease like COPD or asthma. You should use your albuterol  inhaler as a rescue inhaler during respiratory illnesses or emergencies and avoid daily use of inhalers unless your symptoms worsen. If your symptoms become more limiting, we may consider a trial of a steroid inhaler without a long-acting bronchodilator.  -CHRONIC COUGH: Your chronic cough is likely related to sinus issues. You are currently managing your symptoms without medication. If your cough becomes bothersome, you can consider using over-the-counter medications like Delsym or dextromethorphan. If the cough is related to postnasal drip, you might try nasal sprays such as Flonase , or Astepro. Please contact the clinic if your cough becomes unmanageable for further options.  INSTRUCTIONS: Please continue to use your albuterol  inhaler as needed during respiratory illnesses or emergencies. Avoid daily use of inhalers unless your symptoms worsen. If your symptoms become more limiting, we may consider a trial of a steroid inhaler without a long-acting bronchodilator. For your chronic cough, consider over-the-counter medications if it becomes bothersome, and contact the clinic if it becomes unmanageable.   Follow-up 6 months with Dr. Pleas   "

## 2024-10-26 NOTE — Progress Notes (Signed)
 "  @Patient  ID: Erica Greene, female    DOB: [birth year], 79 y.o.   MRN: [id number]  Chief Complaint  Patient presents with   Medical Management of Chronic Issues    Cough variant asthma Pft f/u     Referring provider: Jordan, [name], MD  HPI: 79 year old female, never smoked. PMH significant for HTN, adenocarcinoma upper right lung s/p lobectomy, cough variant asthma, GERD, hyperlipdemia   Previous [facility] pulmonary encounter: Erica Greene is a 79 y.o. female who is here to establish care with me and regular follow up of possible asthma  Discussed the use of AI scribe software for clinical note transcription with the patient, who gave verbal consent to proceed.  History of Present Illness Erica Greene is a 79 year old female with lung cancer who presents with shortness of breath and wheezing.  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 A mediastinal nodule remains and is being monitored with scans every 6-12 months by oncology clinic  She experiences shortness of breath primarily during exertion, such as aerobic exercises, which she performs six days a week. She does not have limitations during exercise aside from occasional shortness of breath with exertion. No significant breathing problems were noted before her surgery, except for a cough that was present even before her cancer diagnosis. Currently, she only coughs when in pain or rushing.  She has not been diagnosed with asthma, but there is a mention of asthma in her medical notes and empiric ICS/LABA has been tried. She denies having asthma as a child and reports no significant environmental allergies, although she suspects she might be allergic to pollen, hay, dust mites, and cockroaches. She has a history of allergies to medications and latex.  She was prescribed Symbicort  for her symptoms but stopped using it due to side effects, including insomnia. Attempts to use it in the morning still  resulted in sleeplessness. She has not used any other inhalers since then.  She denies any history of smoking and reports no significant heart problems, although she had a heart murmur in the past which required antibiotics before dental procedures. She is retired from a career in arts administrator and had some environmental exposure during her work.    10/26/2024- Interim hx  Discussed the use of AI scribe software for clinical note transcription with the patient, who gave verbal consent to proceed.  History of Present Illness Erica Greene is a 79 year old female with adenocarcinoma of the right upper lung status post lobectomy who presents for a follow-up breathing test.  She underwent a lobectomy on April 08, 2022, for adenocarcinoma of the right upper lung. Since the surgery, she has experienced shortness of breath and intermittent wheezing. These symptoms were initially thought to be due to reduced lung capacity or potential asthma.  A recent breathing test showed normal spirometry results with mild restriction in lung volumes and mildly decreased diffusion capacity. There was a 17% improvement in small airways after albuterol .  Her lab work, including a respiratory allergy  panel, IgE, CBC, and eosinophils, were all normal. She does not use a daily inhaler due to sleep disturbances but uses albuterol  as a rescue inhaler during respiratory illnesses.  She manages a cough related to sinus issues without medication. She does not currently use nasal sprays but has used them in the past during winter months.  No acute respiratory complaints.   Pulmonary function testing 10/26/2024 >> FVC 1.61 (80%), FEV1 1.35 (  92%), ratio 84, TLC 72%, DLCOcor 11.54 (75%)  Allergies[1]  Immunization History  Administered Date(s) Administered   INFLUENZA, HIGH DOSE SEASONAL PF 06/25/2012   Influenza Split 09/29/2010   Influenza Whole 05/06/2018   Influenza,inj,Quad PF,6+ Mos 08/09/2013, 06/12/2014    Influenza,trivalent, recombinat, inj, PF 09/29/2010   Influenza-Unspecified 06/13/2021, 05/30/2024   PFIZER(Purple Top)SARS-COV-2 Vaccination 10/19/2019, 11/06/2019, 06/25/2020, 01/02/2021   Pfizer Covid-19 Vaccine Bivalent Booster 1yrs & up 06/13/2021   Pneumococcal Conjugate-13 06/12/2014   Pneumococcal Polysaccharide-23 09/29/2009   Pneumococcal-Unspecified 06/13/2015, 02/06/2021   Tdap 09/29/2006   Zoster, Live 09/29/2009    Past Medical History:  Diagnosis Date   10 year risk of MI or stroke 7.5% or greater 08/17/2020   16.2%   Anemia    during pregnancies   Cancer (HCC)    pre-cancerous skin lesions on face. surgically removed.   Family history of breast cancer    Family history of uterine cancer    Hypertension    Lung cancer (HCC) 03/2022   Pneumonia    as a child    Tobacco History: Tobacco Use History[2] Counseling given: Not Answered   Outpatient Medications Prior to Visit  Medication Sig Dispense Refill   acetaminophen  (TYLENOL ) 325 MG tablet Take 2 tablets (650 mg total) by mouth every 4 (four) hours as needed for mild pain or moderate pain.     chlorthalidone  (HYGROTON ) 25 MG tablet Take 1 tablet (25 mg total) by mouth daily. 90 tablet 2   Cholecalciferol ([employer] VIT D 5000 HIGH-POTENCY PO) Take 5,000 Units by mouth daily.     Coenzyme Q10 (COQ10 PO) Take 1 tablet by mouth at bedtime.     MAGNESIUM PO Take 200 mg by mouth daily.     Multiple Vitamin (MULTIVITAMIN ADULT PO) Take by mouth daily.     Probiotic Product (PROBIOTIC DAILY PO) Take 1 Capful by mouth daily.     Turmeric 500 MG CAPS Take 500 mg by mouth daily.     fluticasone  (FLONASE ) 50 MCG/ACT nasal spray Place 1 spray into both nostrils daily. (Patient not taking: Reported on 10/26/2024) 16 g 5   No facility-administered medications prior to visit.   Review of Systems  Review of Systems  Constitutional: Negative.   Respiratory: Negative.     Physical Exam  BP 118/68   Pulse 67   Temp 97.8  F (36.6 C)   Ht 4' 10 (1.473 m)   Wt 130 lb 12.8 oz (59.3 kg)   SpO2 98% Comment: ra  BMI 27.34 kg/m  Physical Exam Constitutional:      Appearance: Normal appearance. She is well-developed.  HENT:     Head: Normocephalic and atraumatic.     Mouth/Throat:     Mouth: Mucous membranes are moist.     Pharynx: Oropharynx is clear.  Eyes:     Pupils: Pupils are equal, round, and reactive to light.  Cardiovascular:     Rate and Rhythm: Normal rate and regular rhythm.     Heart sounds: Normal heart sounds. No murmur heard. Pulmonary:     Effort: Pulmonary effort is normal. No respiratory distress.     Breath sounds: Normal breath sounds. No wheezing or rhonchi.  Musculoskeletal:        General: Normal range of motion.     Cervical back: Normal range of motion and neck supple.  Skin:    General: Skin is warm and dry.     Findings: No erythema or rash.  Neurological:  General: No focal deficit present.     Mental Status: She is alert and oriented to person, place, and time. Mental status is at baseline.  Psychiatric:        Mood and Affect: Mood normal.        Behavior: Behavior normal.        Thought Content: Thought content normal.        Judgment: Judgment normal.     Lab Results:  CBC    Component Value Date/Time   WBC 8.0 08/29/2024 0813   RBC 4.90 08/29/2024 0813   HGB 14.1 08/29/2024 0813   HGB 13.6 05/09/2024 0737   HGB 13.0 02/05/2021 0958   HCT 42.5 08/29/2024 0813   HCT 39.6 02/05/2021 0958   PLT 402.0 (H) 08/29/2024 0813   PLT 370 05/09/2024 0737   PLT 344 02/05/2021 0958   MCV 86.9 08/29/2024 0813   MCV 87 02/05/2021 0958   MCH 28.0 05/09/2024 0737   MCHC 33.2 08/29/2024 0813   RDW 13.8 08/29/2024 0813   RDW 13.1 02/05/2021 0958   LYMPHSABS 2.7 08/29/2024 0813   LYMPHSABS 2.1 02/05/2021 0958   MONOABS 0.5 08/29/2024 0813   EOSABS 0.1 08/29/2024 0813   EOSABS 0.1 02/05/2021 0958   BASOSABS 0.0 08/29/2024 0813   BASOSABS 0.0 02/05/2021 0958     BMET    Component Value Date/Time   NA 140 08/31/2024 0759   NA 143 02/05/2021 0958   K 4.0 08/31/2024 0759   CL 99 08/31/2024 0759   CO2 33 (H) 08/31/2024 0759   GLUCOSE 107 (H) 08/31/2024 0759   BUN 19 08/31/2024 0759   BUN 15 02/05/2021 0958   CREATININE 0.83 08/31/2024 0759   CREATININE 0.83 05/09/2024 0737   CALCIUM 10.1 08/31/2024 0759   GFRNONAA >60 05/09/2024 0737   GFRAA 85 08/16/2020 1207    BNP No results found for: BNP  ProBNP No results found for: PROBNP  Imaging: No results found.   Assessment & Plan:    Assessment and Plan Assessment & Plan Restrictive lung disease status post right upper lobectomy for adenocarcinoma Mild restrictive lung disease with decreased TLC and mildly decreased diffusion capacity, consistent with expected physiological loss post-lobectomy. No evidence of obstructive lung disease such as COPD or asthma. Spirometry shows normal FEV1 and no obstructive lung disease. Mild improvement in small airways post-albuterol , not diagnostic of asthma. Overall, breathing test is reassuring and consistent with post-surgical changes. - Use albuterol  inhaler as a rescue inhaler during respiratory illnesses or emergencies. - Will consider trial of steroid inhaler without long-acting bronchodilator if symptoms become limiting.  Chronic cough Mild, no significant impairment in daily functioning. Likely related to sinus issues. No current use of nasal spray. Prefers to manage symptoms without medication.  - Consider over-the-counter Delsym or dextromethorphan if cough becomes bothersome. - Consider nasal spray such as Flonase , or Astepro if cough is related to postnasal drip. - Contact clinic if cough becomes unmanageable for further options.  Recording duration: 9 minutes    Almarie LELON Ferrari, NP 10/26/2024     [1]  Allergies Allergen Reactions   Penicillins Anaphylaxis   Amlodipine Swelling    Gums swell   Lisinopril Swelling     Facial swelling   Sulfa Antibiotics Itching   Latex Rash  [2]  Social History Tobacco Use  Smoking Status Never  Smokeless Tobacco Never   "

## 2024-10-26 NOTE — Progress Notes (Signed)
 Full pft performed today

## 2024-10-26 NOTE — Patient Instructions (Signed)
 Full pft performed today

## 2025-03-08 ENCOUNTER — Ambulatory Visit: Admitting: Family Medicine

## 2025-04-17 ENCOUNTER — Ambulatory Visit

## 2025-05-08 ENCOUNTER — Inpatient Hospital Stay

## 2025-05-08 ENCOUNTER — Other Ambulatory Visit (HOSPITAL_COMMUNITY)

## 2025-05-15 ENCOUNTER — Inpatient Hospital Stay: Admitting: Internal Medicine
